# Patient Record
Sex: Female | Born: 1995
Health system: Southern US, Community
[De-identification: ages and names within clinical notes are randomized; demographics above are authoritative.]

## PROBLEM LIST (undated history)

## (undated) ENCOUNTER — Inpatient Hospital Stay (HOSPITAL_COMMUNITY): Payer: Self-pay

## (undated) VITALS — BP 112/80 | HR 105 | Temp 98.1°F | Resp 16 | Ht 59.0 in | Wt 107.8 lb

## (undated) DIAGNOSIS — N912 Amenorrhea, unspecified: Secondary | ICD-10-CM

## (undated) DIAGNOSIS — K625 Hemorrhage of anus and rectum: Secondary | ICD-10-CM

## (undated) DIAGNOSIS — R011 Cardiac murmur, unspecified: Secondary | ICD-10-CM

## (undated) DIAGNOSIS — F909 Attention-deficit hyperactivity disorder, unspecified type: Secondary | ICD-10-CM

## (undated) DIAGNOSIS — J45909 Unspecified asthma, uncomplicated: Secondary | ICD-10-CM

## (undated) HISTORY — PX: NO PAST SURGERIES: SHX2092

## (undated) HISTORY — PX: OTHER SURGICAL HISTORY: SHX169

## (undated) HISTORY — DX: Hemorrhage of anus and rectum: K62.5

---
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
## 2016-01-07 ENCOUNTER — Emergency Department (HOSPITAL_COMMUNITY)
Admission: EM | Admit: 2016-01-07 | Discharge: 2016-01-08 | Disposition: A | Discharge status: Home / Self Care | Payer: Medicaid Other | Attending: Emergency Medicine | Admitting: Emergency Medicine

## 2016-01-07 ENCOUNTER — Encounter (HOSPITAL_COMMUNITY): Payer: Self-pay | Admitting: Emergency Medicine

## 2016-01-07 DIAGNOSIS — Z8719 Personal history of other diseases of the digestive system: Secondary | ICD-10-CM | POA: Insufficient documentation

## 2016-01-07 DIAGNOSIS — F909 Attention-deficit hyperactivity disorder, unspecified type: Secondary | ICD-10-CM | POA: Insufficient documentation

## 2016-01-07 DIAGNOSIS — Z3202 Encounter for pregnancy test, result negative: Secondary | ICD-10-CM | POA: Insufficient documentation

## 2016-01-07 DIAGNOSIS — F3010 Manic episode without psychotic symptoms, unspecified: Secondary | ICD-10-CM

## 2016-01-07 DIAGNOSIS — F1094 Alcohol use, unspecified with alcohol-induced mood disorder: Secondary | ICD-10-CM

## 2016-01-07 DIAGNOSIS — F1490 Cocaine use, unspecified, uncomplicated: Secondary | ICD-10-CM

## 2016-01-07 DIAGNOSIS — J45909 Unspecified asthma, uncomplicated: Secondary | ICD-10-CM | POA: Insufficient documentation

## 2016-01-07 DIAGNOSIS — R000 Tachycardia, unspecified: Secondary | ICD-10-CM | POA: Insufficient documentation

## 2016-01-07 DIAGNOSIS — Z79899 Other long term (current) drug therapy: Secondary | ICD-10-CM | POA: Insufficient documentation

## 2016-01-07 DIAGNOSIS — IMO0002 Reserved for concepts with insufficient information to code with codable children: Secondary | ICD-10-CM

## 2016-01-07 DIAGNOSIS — F1210 Cannabis abuse, uncomplicated: Secondary | ICD-10-CM | POA: Insufficient documentation

## 2016-01-07 DIAGNOSIS — F309 Manic episode, unspecified: Secondary | ICD-10-CM | POA: Insufficient documentation

## 2016-01-07 DIAGNOSIS — F419 Anxiety disorder, unspecified: Secondary | ICD-10-CM | POA: Insufficient documentation

## 2016-01-07 DIAGNOSIS — Z8742 Personal history of other diseases of the female genital tract: Secondary | ICD-10-CM | POA: Insufficient documentation

## 2016-01-07 DIAGNOSIS — R011 Cardiac murmur, unspecified: Secondary | ICD-10-CM | POA: Insufficient documentation

## 2016-01-07 DIAGNOSIS — R451 Restlessness and agitation: Secondary | ICD-10-CM | POA: Insufficient documentation

## 2016-01-07 DIAGNOSIS — F1910 Other psychoactive substance abuse, uncomplicated: Secondary | ICD-10-CM

## 2016-01-07 DIAGNOSIS — F10920 Alcohol use, unspecified with intoxication, uncomplicated: Secondary | ICD-10-CM

## 2016-01-07 DIAGNOSIS — F10120 Alcohol abuse with intoxication, uncomplicated: Secondary | ICD-10-CM | POA: Insufficient documentation

## 2016-01-07 LAB — SALICYLATE LEVEL

## 2016-01-07 LAB — RAPID URINE DRUG SCREEN, HOSP PERFORMED
Amphetamines: NOT DETECTED
BARBITURATES: NOT DETECTED
Benzodiazepines: NOT DETECTED
Cocaine: POSITIVE — AB
Opiates: NOT DETECTED
Tetrahydrocannabinol: POSITIVE — AB

## 2016-01-07 LAB — CBC WITH DIFFERENTIAL/PLATELET
BASOS PCT: 1 %
Basophils Absolute: 0.1 10*3/uL (ref 0.0–0.1)
EOS ABS: 0 10*3/uL (ref 0.0–0.7)
EOS PCT: 0 %
HEMATOCRIT: 38.4 % (ref 36.0–46.0)
HEMOGLOBIN: 12.7 g/dL (ref 12.0–15.0)
LYMPHS PCT: 33 %
Lymphs Abs: 1.9 10*3/uL (ref 0.7–4.0)
MCH: 28 pg (ref 26.0–34.0)
MCHC: 33.1 g/dL (ref 30.0–36.0)
MCV: 84.6 fL (ref 78.0–100.0)
Monocytes Absolute: 0.5 10*3/uL (ref 0.1–1.0)
Monocytes Relative: 9 %
NEUTROS ABS: 3.3 10*3/uL (ref 1.7–7.7)
NEUTROS PCT: 57 %
Platelets: 297 10*3/uL (ref 150–400)
RBC: 4.54 MIL/uL (ref 3.87–5.11)
RDW: 14.1 % (ref 11.5–15.5)
WBC: 5.8 10*3/uL (ref 4.0–10.5)

## 2016-01-07 LAB — COMPREHENSIVE METABOLIC PANEL
ALBUMIN: 4.1 g/dL (ref 3.5–5.0)
ALK PHOS: 70 U/L (ref 38–126)
ALT: 18 U/L (ref 14–54)
AST: 29 U/L (ref 15–41)
Anion gap: 10 (ref 5–15)
BILIRUBIN TOTAL: 0.3 mg/dL (ref 0.3–1.2)
CO2: 24 mmol/L (ref 22–32)
Calcium: 9.1 mg/dL (ref 8.9–10.3)
Chloride: 110 mmol/L (ref 101–111)
Creatinine, Ser: 0.79 mg/dL (ref 0.44–1.00)
GFR calc Af Amer: >60 mL/min (ref >60)
GFR calc non Af Amer: >60 mL/min (ref >60)
GLUCOSE: 120 mg/dL — AB (ref 65–99)
POTASSIUM: 3.6 mmol/L (ref 3.5–5.1)
SODIUM: 144 mmol/L (ref 135–145)
TOTAL PROTEIN: 7.3 g/dL (ref 6.5–8.1)

## 2016-01-07 LAB — URINALYSIS, ROUTINE W REFLEX MICROSCOPIC
BILIRUBIN URINE: NEGATIVE
Glucose, UA: NEGATIVE mg/dL
HGB URINE DIPSTICK: NEGATIVE
KETONES UR: NEGATIVE mg/dL
Leukocytes, UA: NEGATIVE
NITRITE: NEGATIVE
Protein, ur: NEGATIVE mg/dL
Specific Gravity, Urine: 1.003 — ABNORMAL LOW (ref 1.005–1.030)
pH: 6.5 (ref 5.0–8.0)

## 2016-01-07 LAB — POC URINE PREG, ED: PREG TEST UR: NEGATIVE

## 2016-01-07 LAB — ACETAMINOPHEN LEVEL

## 2016-01-07 LAB — ETHANOL: Alcohol, Ethyl (B): 200 mg/dL — ABNORMAL HIGH (ref <5)

## 2016-01-07 MED ORDER — SODIUM CHLORIDE 0.9 % IV BOLUS (SEPSIS): 1000.0000 mL | Freq: Once | INTRAVENOUS | Status: DC

## 2016-01-07 MED ORDER — ALUM & MAG HYDROXIDE-SIMETH 200-200-20 MG/5ML PO SUSP: 30.0000 mL | ORAL | Status: DC | PRN

## 2016-01-07 MED ORDER — CITALOPRAM HYDROBROMIDE 10 MG PO TABS: 20.0000 mg | ORAL_TABLET | Freq: Every day | ORAL | Status: DC

## 2016-01-07 MED ORDER — LORAZEPAM 1 MG PO TABS: 1.0000 mg | ORAL_TABLET | Freq: Three times a day (TID) | ORAL | Status: DC | PRN

## 2016-01-07 MED ORDER — ALBUTEROL SULFATE HFA 108 (90 BASE) MCG/ACT IN AERS: 2.0000 | INHALATION_SPRAY | Freq: Four times a day (QID) | RESPIRATORY_TRACT | Status: DC | PRN

## 2016-01-07 MED ORDER — ACETAMINOPHEN 325 MG PO TABS: 650.0000 mg | ORAL_TABLET | ORAL | Status: DC | PRN

## 2016-01-07 MED ORDER — LORATADINE 10 MG PO TABS: 10.0000 mg | ORAL_TABLET | Freq: Every day | ORAL | Status: DC

## 2016-01-07 MED ORDER — ONDANSETRON HCL 4 MG PO TABS
4.0000 mg | ORAL_TABLET | Freq: Three times a day (TID) | ORAL | Status: DC | PRN
Start: 2016-01-07 — End: 2016-01-08
  Administered 2016-01-07: 4 mg via ORAL
  Filled 2016-01-07: qty 1

## 2016-01-07 MED ORDER — ZOLPIDEM TARTRATE 5 MG PO TABS: 5.0000 mg | ORAL_TABLET | Freq: Every evening | ORAL | Status: DC | PRN

## 2016-01-07 NOTE — ED Notes (Addendum)
Pt arrives with family members stating that she hasn't been acting herself since 0100 this morning, aunt states she's acting out. Inappropriate behavior with RN. Pt states, "everyone's worried about whether or not I'm alive, I just wanna record a track at the studio." Pt alert x4, but laughing inappropriately, attention span seems shortened. Aunt brought her in suspecting that she took some kind of drug. Pt denies.

## 2016-01-07 NOTE — ED Notes (Signed)
Pt ambulated to restroom w/ sitter.

## 2016-01-07 NOTE — ED Notes (Signed)
Lunch ordered for pt

## 2016-01-07 NOTE — ED Notes (Signed)
States does not take any meds. Claritin and Celexa d/c'd.

## 2016-01-07 NOTE — ED Notes (Signed)
PA at bedside.

## 2016-01-07 NOTE — ED Notes (Signed)
TTS in process; sitter at bedside; no signs of distress or needs.

## 2016-01-07 NOTE — ED Notes (Signed)
Sitter at bedside.

## 2016-01-07 NOTE — ED Notes (Signed)
Pt's father visiting. Answered his and pt's questions. Pt states she feels fine at this time and is wanting to leave. Advised pt unable to do so. Father also advised pt that she needs to go w/tx plan and be cooperative. Pt voiced understanding. Ad[facility] and father [facility] to stay overnight and will re-assess in am. Both in agreement w/plan.

## 2016-01-07 NOTE — BH Assessment (Addendum)
Tele Assessment Note   Marie Cabrera is an 20 y.o. female who was brought in by her adoptive mother who is her aunt (per ED PA) due to behavioral concerns related to drug use. Pt states, "I just used a little bit too much last night and I just wanted to go in to the studio and record and they got worried about me". 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 She states that she does not receive any OP services despite having an admission for an overdose in 2013. "I am always depressed, I always have SI off and on and think of plans, but "I know better" and "I know how to deal with it myself".  Pt reports taking no medication. Pt denies specific depressive symptoms, and says she is eating and sleeping normally, and that she "surrounds herself with people," but denies having true support.  PT denies homicidal ideation or history of violence. Pt denies auditory or visual hallucinations or other psychotic symptoms. Pt admits to alcohol abuse, "I just turned 20 and I have been drinking a lot (denies daily drinking, but admits to binge drinking when she does drink, "I don't have a problem, I don't even like drinking". Pt admits to daily marijuana use and occasional cocaine use.    Pt has limited insight and poor judgement at this time, but is under the influence of substances at the time of assessment per BAL and UDS (200 ETOH and positive for marijuana and cocaine).   Pt is casually dressed, alert, oriented x3 (not day of the week) with pressured speech and restless motor behavior Eye contact is poor  Pt's mood is irritable and affect is labile with occasional inappropriate laughter which is incongruent with thought content. Affect is congruent with mood. Thought process is racing.  It is unclear if pt  is currently responding to internal stimuli or experiencing delusional thought content. Pt was cooperative throughout  assessment.   Collateral information: Spoke with pt's aunt who brought her in to the ED due to behavior from 1-5 am after going "out" with her brother and cousin. being "wired, spinning around, could not get her to stop even after getting her in the shower". She is very concerned about the pt and wants to get her some help. 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 She says that pt has not been herself lately and that she knows she is depressed. She states that pt's adoptive mom has been in and out of the hospital since last September and is currently admitted at [facility] but will be transferred to [facility] to see a specialist. At one point this past year, pt's mom had to be resusitated due to severe GI problems, and [facility] is trying to fly in an MD from another state to rebuild her lower intestine. She states that pt is "great with the kids" (she cares for 2 younger kids in the home and for mom with her health problems), and she "has been under so much stress lately and needs help".  Pt's bio mom was a cousin of adopted mom, and died of a cocaine OD laced with rat poison. She states that pt was old enough to see her bio mom on drugs "running around naked".  Disposition: per Shuvon Rankin, NP--Seek Ip treatment.  Diagnosis: Major Depressive Disorder, recurrent, severe, Substance  Abuse Disorder  Past Medical History:  Past Medical History  Diagnosis Date  . Rectal bleeding   . ADHD (attention deficit hyperactivity disorder)   . Asthma     last ospitalized >4 years ago.  NO recent problesm  . Heart murmur     [facility].  MD 2 d Echo [id number]  . Amenorrhea     last menses 2007    Past Surgical History  Procedure Laterality Date  . No surgical history    . Colonoscopy  03/14/2012    Procedure: COLONOSCOPY;  Surgeon: Jon Gills, MD;  Location: [facility] OR;  Service: Gastroenterology;  Laterality: N/A;    Family History:  Family History  Problem Relation Age  of Onset  . Adopted: Yes  . Crohn's disease Maternal Aunt   . Colon polyps Maternal Aunt   . Colon cancer Maternal Grandfather   . Alcohol abuse Mother   . Depression Mother   . Drug abuse Mother   . Mental illness Mother   . Alcohol abuse Father   . Drug abuse Father   . Mental illness Sister   . Alcohol abuse Maternal Grandmother   . Depression Maternal Grandmother   . Diabetes Maternal Grandmother   . Kidney disease Maternal Grandmother   . Other      Pt is adopted, only knows about maternal family    Social History:  reports that she has been passively smoking.  She has never used smokeless tobacco. She reports that she uses illicit drugs (Marijuana). She reports that she does not drink alcohol.  Additional Social History:  Alcohol / Drug Use Pain Medications: None Prescriptions: None Over the Counter: N/A History of alcohol / drug use?: Yes Longest period of sobriety (when/how long): unk Negative Consequences of Use: Personal relationships Withdrawal Symptoms:  (denies) Substance #1 Name of Substance 1: alcohol 1 - Age of First Use: unk 1 - Amount (size/oz): variable 1 - Frequency: variable 1 - Duration: "I just turned 20, so i have been drinking a lot" 1 - Last Use / Amount: last night "a lot" Substance #2 Name of Substance 2: cocaine 2 - Age of First Use: unk 2 - Amount (size/oz): "a lot' 2 - Frequency: variable 2 - Duration: unk 2 - Last Use / Amount: last night Substance #3 Name of Substance 3: marijuana 3 - Age of First Use: unk 3 - Amount (size/oz): unk 3 - Frequency: daily 3 - Duration: ongoing 3 - Last Use / Amount: last night  CIWA: CIWA-Ar BP: 142/97 mmHg Pulse Rate: 109 COWS:    PATIENT STRENGTHS: (choose at least two) Ability for insight Average or above average intelligence Capable of independent living Communication skills  Allergies: No Known Allergies  Home Medications:  (Not in a hospital admission)  OB/GYN Status:  No LMP  recorded.  General Assessment Data Location of Assessment: [facility] ED TTS Assessment: In system Is this a Tele or Face-to-Face Assessment?: Tele Assessment Is this an Initial Assessment or a Re-assessment for this encounter?: Initial Assessment Marital status: Single Is patient pregnant?: Unknown Pregnancy Status: Unknown Living Arrangements: Parent Can pt return to current living arrangement?: Yes Admission Status: Involuntary Is patient capable of signing voluntary admission?: Yes Referral Source: Self/Family/Friend Insurance type: Sp     Crisis Care Plan Living Arrangements: Parent Name of Psychiatrist:  (none) Name of Therapist: none  Education Status Is patient currently in school?: No  Risk to self with the past 6 months Suicidal Ideation: Yes-Currently Present  Has patient been a risk to self within the past 6 months prior to admission? : No Suicidal Intent: No Has patient had any suicidal intent within the past 6 months prior to admission? : No Is patient at risk for suicide?: Yes Suicidal Plan?: Yes-Currently Present Has patient had any suicidal plan within the past 6 months prior to admission? : Yes Specify Current Suicidal Plan:  (refused to elaborate) Access to Means:  (unk) What has been your use of drugs/alcohol within the last 12 months?:  (see Sa section) Previous Attempts/Gestures: Yes How many times?: 1 Other Self Harm Risks:  (SA) Triggers for Past Attempts: Unknown Intentional Self Injurious Behavior: None Family Suicide History: Unknown Recent stressful life event(s):  (denies) Persecutory voices/beliefs?: No Depression: Yes Depression Symptoms: Loss of interest in usual pleasures, Feeling angry/irritable Substance abuse history and/or treatment for substance abuse?: Yes Suicide prevention information given to non-admitted patients: Not applicable  Risk to Others within the past 6 months Homicidal Ideation: No Does patient have any lifetime risk of  violence toward others beyond the six months prior to admission? : Unknown Thoughts of Harm to Others: No Current Homicidal Intent: No Current Homicidal Plan: No Access to Homicidal Means: No History of harm to others?: No Assessment of Violence: None Noted Does patient have access to weapons?: No Criminal Charges Pending?: No Does patient have a court date: No Is patient on probation?: No  Psychosis Hallucinations: None noted Delusions: None noted  Mental Status Report Appearance/Hygiene: Disheveled Eye Contact: Poor Motor Activity: Agitation Speech: Rapid, Pressured Level of Consciousness: Drowsy Mood: Irritable, Labile Affect: Labile, Inconsistent with thought content Anxiety Level: Moderate Thought Processes: Coherent, Relevant Judgement: Impaired Orientation: Person, Place, Situation, Appropriate for developmental age Obsessive Compulsive Thoughts/Behaviors: None  Cognitive Functioning Concentration: Fair Memory: Recent Intact, Remote Intact IQ: Average Insight: Poor Impulse Control: Fair Appetite: Good Weight Loss: 0 Weight Gain: 0 Sleep: No Change Total Hours of Sleep: 8  ADLScreening [facility] Assessment Services) Patient's cognitive ability adequate to safely complete daily activities?: Yes Patient able to express need for assistance with ADLs?: Yes Independently performs ADLs?: Yes (appropriate for developmental age)  Prior Inpatient Therapy Prior Inpatient Therapy: Yes Prior Therapy Dates: 2013 [facility] Reason for Treatment: suicide attempt  Prior Outpatient Therapy Prior Outpatient Therapy: No Does patient have an ACCT team?: No Does patient have Intensive In-House Services?  : No Does patient have [facility] services? : No Does patient have P4CC services?: No  ADL Screening (condition at time of admission) Patient's cognitive ability adequate to safely complete daily activities?: Yes Is the patient deaf or have  difficulty hearing?: No Does the patient have difficulty seeing, even when wearing glasses/contacts?: No Does the patient have difficulty concentrating, remembering, or making decisions?: No Patient able to express need for assistance with ADLs?: Yes Does the patient have difficulty dressing or bathing?: No Independently performs ADLs?: Yes (appropriate for developmental age) Does the patient have difficulty walking or climbing stairs?: No Weakness of Legs: None Weakness of Arms/Hands: None  Home Assistive Devices/Equipment Home Assistive Devices/Equipment: None    Abuse/Neglect Assessment (Assessment to be complete while patient is alone) Physical Abuse: Denies, provider concerned (Comment) ("I don't know") Verbal Abuse: Denies, provider concerned (Comment) Sexual Abuse: Denies, provider concered (Comment) Exploitation of patient/patient's resources: Denies Self-Neglect: Denies Values / Beliefs Cultural Requests During Hospitalization: None Spiritual Requests During Hospitalization: None   Advance Directives (For Healthcare) Does patient have an advance directive?: No Would patient like information on creating  an advanced directive?: No - patient declined information    Additional Information 1:1 In Past 12 Months?: No CIRT Risk: No Elopement Risk: Yes Does patient have medical clearance?: Yes     Disposition:  Disposition Initial Assessment Completed for this Encounter: Yes Disposition of Patient: Other dispositions (observe overnight) Other disposition(s):  (observe overnight)  Marie Cabrera 01/07/2016 9:44 AM

## 2016-01-07 NOTE — Progress Notes (Signed)
[facility] per Dannielle Huhanny (while he notes it is unlikely there will be a bed this weekend)  [facility] at capacity at this time but considered for admission upon bed availability. No other referral options at this time. Per ED, pt requesting to be d/c'd home. Pt can be re-evaluated 01/08/16 am (if not placed by that time) to determine cont'd need for inpatient treatment.  Ilean SkillMeghan Weda Baumgarner, MSW, LCSW Clinical Social Work, Disposition  01/07/2016 [phone]

## 2016-01-07 NOTE — ED Notes (Signed)
Original IVC papers (Affidavit & 1st exam) placed in folder for Magistrate. [facility] and copy sent to medical records.

## 2016-01-07 NOTE — ED Notes (Signed)
Pt asking if she will be given med to help her sleep this evening. Advised pt Ambien may be given. Voiced understanding.

## 2016-01-07 NOTE — ED Notes (Signed)
Pt resting; sitter at bedside. 

## 2016-01-07 NOTE — ED Notes (Signed)
[facility] w/sitter. Pt noted to be dressed in personal clothing - pt initially refusing to change into paper scrubs. Stated she wants to leave - advised pt is IVC - voiced understanding and changed. Security wanded pt.

## 2016-01-07 NOTE — ED Notes (Signed)
IVC papers served by [employer]. 

## 2016-01-07 NOTE — ED Notes (Addendum)
[facility] [phone]; BH updated.

## 2016-01-07 NOTE — ED Provider Notes (Signed)
CSN: [id number]     Arrival date & time 01/07/16  0536 History   First MD Initiated Contact with Patient 01/07/16 0602     Chief Complaint  Patient presents with  . Behavior Problem     (Consider location/radiation/quality/duration/timing/severity/associated sxs/prior Treatment) HPI   Marie Cabrera is a 20 y.o. female with PMH significant for ADHD, depression, suicidal ideations, hx of self harm (cutting) who presents with behavioral problem.  Patient presents to the emergency department brought in by her aunt and her cousin. Aunt reports that the patient and some of the younger family members had been "out". She states that they had been drinking, but the patient was constantly in the bathroom. Patient does report that she did cocaine "recently". She states "but I know how to handle myself ". She states that she does occasionally smoke marijuana. She denies alcohol use. She reports that approximately 1 AM this morning she began acting very strangely, and reports her acting out. 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 And also states that her mother is sick and is in the hospital upstairs. She states that her biological mother died from cocaine overdose, and she is worried that the patient will do the same. Patient denies any complaints. She denies headache, visual disturbance, chest pain, shortness of breath, abdominal pain, or urinary symptoms. She denies suicidal ideation or homicidal ideation. She does state that she "I've been wanting to commit suicide for a long time, but that's not even really an issue ".  Per chart review, 05/21/2012 patient presented to the emergency department or suicide attempt by ingestion. She also has a history of self-harm.  Patient met inpatient criteria for treatment, and was admitted.    Past Medical History   Diagnosis Date  . Rectal bleeding   . ADHD (attention deficit hyperactivity disorder)   . Asthma     last ospitalized >4 years ago.  NO recent problesm  . Heart murmur     [facility].  MD 2 d Echo [address]012  . Amenorrhea     last menses 2007   Past Surgical History  Procedure Laterality Date  . No surgical history    . Colonoscopy  03/14/2012    Procedure: COLONOSCOPY;  Surgeon: Oletha Blend, MD;  Location: [facility];  Service: Gastroenterology;  Laterality: N/A;   Family History  Problem Relation Age of Onset  . Adopted: Yes  . Crohn's disease Maternal Aunt   . Colon polyps Maternal Aunt   . Colon cancer Maternal Grandfather   . Alcohol abuse Mother   . Depression Mother   . Drug abuse Mother   . Mental illness Mother   . Alcohol abuse Father   . Drug abuse Father   . Mental illness Sister   . Alcohol abuse Maternal Grandmother   . Depression Maternal Grandmother   . Diabetes Maternal Grandmother   . Kidney disease Maternal Grandmother   . Other      Pt is adopted, only knows about maternal family   Social History  Substance Use Topics  . Smoking status: Passive Smoke Exposure - Never Smoker  . Smokeless tobacco: Never Used  . Alcohol Use: No   OB History    No data available     Review of Systems  Psychiatric/Behavioral: Positive for  behavioral problems. Negative for suicidal ideas.  All other systems reviewed and are negative.     Allergies  Review of patient's allergies indicates no known allergies.  Home Medications   Prior to Admission medications   Medication Sig Start Date End Date Taking? Authorizing Provider  cetirizine (ZYRTEC) 10 MG tablet Take 10 mg by mouth daily. Seasonal allergies 01/14/12   Historical Provider, MD  citalopram (CELEXA) 20 MG tablet Take 1 tablet (20 mg total) by mouth daily after breakfast. 05/29/12   Aurelio Jew, NP  dextroamphetamine (DEXTROSTAT) 5 MG tablet Take 1 tablet (5 mg total) by mouth 2 (two) times daily with  breakfast and lunch. 05/29/12   Aurelio Jew, NP  PROAIR HFA 108 (90 BASE) MCG/ACT inhaler Inhale 2 puffs into the lungs every 6 (six) hours as needed. Seasonal wheezing or shortness of breath 01/15/12   Historical Provider, MD   BP 142/97 mmHg  Pulse 109  Temp(Src) 98.7 F (37.1 C) (Oral)  Resp 16  Ht 5' (1.524 m)  Wt 58.968 kg  BMI 25.39 kg/m2  SpO2 99%  LMP  Physical Exam  Constitutional: She is oriented to person, place, and time. She appears well-developed and well-nourished.  Non-toxic appearance. She does not have a sickly appearance. She does not appear ill.  HENT:  Head: Normocephalic and atraumatic.  Mouth/Throat: Oropharynx is clear and moist.  Eyes: Conjunctivae are normal. Pupils are equal, round, and reactive to light.  Neck: Normal range of motion. Neck supple.  Cardiovascular: Regular rhythm and normal heart sounds.  Tachycardia present.   No murmur heard. Pulmonary/Chest: Effort normal and breath sounds normal. No accessory muscle usage or stridor. No respiratory distress. She has no wheezes. She has no rhonchi. She has no rales.  Abdominal: Soft. Bowel sounds are normal. She exhibits no distension. There is no tenderness.  Musculoskeletal: Normal range of motion.  Lymphadenopathy:    She has no cervical adenopathy.  Neurological: She is alert and oriented to person, place, and time.  Speech is rapid, pressured, accelerated, and difficulty to interrupt.   Skin: Skin is warm and dry.  Psychiatric: Her mood appears anxious. Her affect is labile and inappropriate. Her speech is rapid and/or pressured. She is agitated and hyperactive. She expresses impulsivity. She expresses suicidal (says "i've been wanting to commit suicide for years") ideation. She expresses no homicidal ideation. She expresses no suicidal plans and no homicidal plans.    ED Course  Procedures (including critical care time) Labs Review Labs Reviewed  COMPREHENSIVE METABOLIC PANEL - Abnormal; Notable  for the following:    Glucose, Bld 120 (*)    BUN <5 (*)    All other components within normal limits  ETHANOL - Abnormal; Notable for the following:    Alcohol, Ethyl (B) 200 (*)    All other components within normal limits  URINE RAPID DRUG SCREEN, HOSP PERFORMED - Abnormal; Notable for the following:    Cocaine POSITIVE (*)    Tetrahydrocannabinol POSITIVE (*)    All other components within normal limits  ACETAMINOPHEN LEVEL - Abnormal; Notable for the following:    Acetaminophen (Tylenol), Serum <10 (*)    All other components within normal limits  URINALYSIS, ROUTINE W REFLEX MICROSCOPIC (NOT AT [facility]) - Abnormal; Notable for the following:    Specific Gravity, Urine 1.003 (*)    All other components within normal limits  CBC WITH DIFFERENTIAL/PLATELET  SALICYLATE LEVEL  POC URINE PREG, ED    Imaging Review No results  found. I have personally reviewed and evaluated these images and lab results as part of my medical decision-making.   EKG Interpretation   Date/Time:  Saturday January 07 2016 06:44:50 EDT Ventricular Rate:  93 PR Interval:  175 QRS Duration: 84 QT Interval:  358 QTC Calculation: 445 R Axis:   48 Text Interpretation:  Sinus rhythm ST elev, probable normal early repol  pattern No acute changes No significant change since last tracing  Confirmed by Kathrynn Humble, MD, ANKIT [phone]) on 01/07/2016 7:13:51 AM      MDM   Final diagnoses:  Cocaine use  Substance abuse  Manic behavior ([place])   Patient presents to ED brought in by family members with concern for abnormal behavior.  Patient reports using cocaine recently.  Family members concerned she is at risk for harming herself.  Hx of suicide attempt, self harm, and substance abuse (marijuana).  Mother died of cocaine overdose.  On arrival, vitals stable with mild tachycardia.  Patient has no complaints.  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  She exhibits manic behavior with rapid  pressured speech, labile mood, ideas of grandiosity.  This is likely cocaine induced.  Given her history, substance abuse, and manic behavior, I do not feel she is safe to be discharged, I am concerned about self harm.  Therefore, she is under IVC.  Labs obtained.  Ethanol level 200, UDS positive for cocaine and THC.  Otherwise, without acute abnormality.  TTS consulted for appropriate disposition and recommendations.  Case has been discussed with Dr. Kathrynn Humble who agrees with the above plan.    Gloriann Loan, PA-C 01/07/16 [place], MD 01/07/16 2306

## 2016-01-08 ENCOUNTER — Encounter (HOSPITAL_COMMUNITY): Payer: Self-pay | Admitting: Registered Nurse

## 2016-01-08 DIAGNOSIS — F1094 Alcohol use, unspecified with alcohol-induced mood disorder: Secondary | ICD-10-CM | POA: Diagnosis not present

## 2016-01-08 DIAGNOSIS — IMO0002 Reserved for concepts with insufficient information to code with codable children: Secondary | ICD-10-CM

## 2016-01-08 DIAGNOSIS — R69 Illness, unspecified: Secondary | ICD-10-CM | POA: Diagnosis not present

## 2016-01-08 DIAGNOSIS — F1910 Other psychoactive substance abuse, uncomplicated: Secondary | ICD-10-CM | POA: Insufficient documentation

## 2016-01-08 NOTE — ED Notes (Signed)
Pt signed "No Harm Contract" - copy given to pt. 

## 2016-01-08 NOTE — ED Notes (Signed)
[facility] - so may advise pt is being d/c'd to home - no longer needed placement.

## 2016-01-08 NOTE — ED Provider Notes (Signed)
Patient has been cleared by psychiatry for discharge. She is not suicidal or homicidal and contracts for safety. IVC rescinded. She is awake and alert, oriented 3. Denies any pain. She is pleasant and cooperative.  BP 132/82 mmHg  Pulse 61  Temp(Src) 98.6 F (37 C) (Oral)  Resp 18  Ht 5' (1.524 m)  Wt 130 lb (58.968 kg)  BMI 25.39 kg/m2  SpO2 100%  LMP    Glynn OctaveStephen Hyman Crossan, MD 01/08/16 1447

## 2016-01-08 NOTE — ED Notes (Signed)
Dr Rancour in w/pt. 

## 2016-01-08 NOTE — ED Notes (Signed)
Sitting in bed talking and laughing w/sitter while waiting for aunt.

## 2016-01-08 NOTE — Discharge Instructions (Signed)
Polysubstance Abuse °When people abuse more than one drug or type of drug it is called polysubstance or polydrug abuse. For example, many smokers also drink alcohol. This is one form of polydrug abuse. Polydrug abuse also refers to the use of a drug to counteract an unpleasant effect produced by another drug. It may also be used to help with withdrawal from another drug. People who take stimulants may become agitated. Sometimes this agitation is countered with a tranquilizer. This helps protect against the unpleasant side effects. Polydrug abuse also refers to the use of different drugs at the same time.  °Anytime drug use is interfering with normal living activities, it has become abuse. This includes problems with family and friends. Psychological dependence has developed when your mind tells you that the drug is needed. This is usually followed by physical dependence which has developed when continuing increases of drug are required to get the same feeling or "high". This is known as addiction or chemical dependency. A person's risk is much higher if there is a history of chemical dependency in the family. °SIGNS OF CHEMICAL DEPENDENCY °· You have been told by friends or family that drugs have become a problem. °· You fight when using drugs. °· You are having blackouts (not remembering what you do while using). °· You feel sick from using drugs but continue using. °· You lie about use or amounts of drugs (chemicals) used. °· You need chemicals to get you going. °· You are suffering in work performance or in school because of drug use. °· You get sick from use of drugs but continue to use anyway. °· You need drugs to relate to people or feel comfortable in social situations. °· You use drugs to forget problems. °"Yes" answered to any of the above signs of chemical dependency indicates there are problems. The longer the use of drugs continues, the greater the problems will become. °If there is a family history of  drug or alcohol use, it is best not to experiment with these drugs. Continual use leads to tolerance. After tolerance develops more of the drug is needed to get the same feeling. This is followed by addiction. With addiction, drugs become the most important part of life. It becomes more important to take drugs than participate in the other usual activities of life. This includes relating to friends and family. Addiction is followed by dependency. Dependency is a condition where drugs are now needed not just to get high, but to feel normal. °Addiction cannot be cured but it can be stopped. This often requires outside help and the care of professionals. Treatment centers are listed in the yellow pages under: Cocaine, Narcotics, and Alcoholics Anonymous. Most hospitals and clinics can refer you to a specialized care center. Talk to your caregiver if you need help. °  °This information is not intended to replace advice given to you by your health care provider. Make sure you discuss any questions you have with your health care provider. °  °Document Released: 04/18/2005 Document Revised: 11/19/2011 Document Reviewed: 09/01/2014 °Elsevier Interactive Patient Education ©2016 [employer] ° °

## 2016-01-08 NOTE — ED Notes (Addendum)
Per Jonette EvaBarbara, [facility] - willing to accept pt - [facility] after re-assessment. Requesting to be advised.

## 2016-01-08 NOTE — ED Notes (Signed)
Pt to shower [phone] w/sitter.

## 2016-01-08 NOTE — ED Notes (Signed)
[facility] - aware pt needs to be re-assessed this am.

## 2016-01-08 NOTE — ED Notes (Signed)
IVC rescinded by Dr Manus Gunningancour.

## 2016-01-08 NOTE — ED Notes (Addendum)
Pt changed into personal clothing. Pt requested to stay in room until her aunt arrives to pick her up.

## 2016-01-08 NOTE — ED Notes (Signed)
[facility] aware of [facility]. Pt called someone for ride from ED.

## 2016-01-08 NOTE — ED Notes (Signed)
Rescind paperwork faxed to [employer] of [employer], copy sent to medical records and original placed in folder for magistrate.

## 2016-01-08 NOTE — ED Notes (Signed)
Ordered pt a breakfast tray.

## 2016-01-08 NOTE — Consult Note (Signed)
Telepsych Consultation   Reason for Consult:  Intoxication; bizarre behavior Referring Physician:  EDP Patient Identification: Marie Cabrera MRN:  [id number] Principal Diagnosis: Intoxication Diagnosis:   Patient Active Problem List   Diagnosis Date Noted  . Alcohol-induced mood disorder ([place]) [F10.94] 01/08/2016  . Intoxication [R69] 01/08/2016  . Oppositional defiant disorder [F91.3] 05/22/2012  . Parent-child relational problem [Z62.820] 05/22/2012  . Family history of inflammatory bowel disease [Z83.79] 03/06/2012  . Family history of colonic polyps [Z83.71] 03/06/2012  . Hematochezia [K92.1]     Total Time spent with patient: 30 minutes  Subjective:   Marie Cabrera is a 20 y.o. female patient admitted with [facility] after she was brought in by her adoptive mother for bizarre behavior and drug use (hallucinations) .  HPI:  Patient seen by this provider, case reviewed with Dr. Doyne Keel.  On evaluation:  Marie Cabrera states that she turned 20 yrs old yesterday and she had to much to drink getting intoxicated.  States that she can't remember what she said or did yesterday.  Patient denies a past psych history (No inpatient, outpatient, or psychotropics)  Patient does admit that she has suicidal thoughts/suicide attempt several years ago but did not go inpatient.  Patient states that she is living with her mother and has been in [place] for 5 months.  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  At this time patient denies suicidal/homicidal ideation, psychosis, and paranoia.  States that she has learned her lesson with alcohol.  Patient is alert, oriented x4, calm, and cooperative.   Past Psychiatric History: Prior history of suicidal thought/attempt several years ago.  No inpt/outpt or psychotropics  Risk to Self: Suicidal Ideation: Yes-Currently Present Suicidal Intent: No Is patient at risk for suicide?: Yes Suicidal Plan?: Yes-Currently Present Specify Current Suicidal Plan:   (refused to elaborate) Access to Means:  (unk) What has been your use of drugs/alcohol within the last 12 months?:  (see Sa section) How many times?: 1 Other Self Harm Risks:  (SA) Triggers for Past Attempts: Unknown Intentional Self Injurious Behavior: None Risk to Others: Homicidal Ideation: No Thoughts of Harm to Others: No Current Homicidal Intent: No Current Homicidal Plan: No Access to Homicidal Means: No History of harm to others?: No Assessment of Violence: None Noted Does patient have access to weapons?: No Criminal Charges Pending?: No Does patient have a court date: No Prior Inpatient Therapy: Prior Inpatient Therapy: Yes Prior Therapy Dates: 2013 Prior Therapy Facilty/Provider(s): [facility] Reason for Treatment: suicide attempt Prior Outpatient Therapy: Prior Outpatient Therapy: No Does patient have an ACCT team?: No Does patient have Intensive In-House Services?  : No Does patient have [facility] services? : No Does patient have P4CC services?: No  Past Medical History:  Past Medical History  Diagnosis Date  . Rectal bleeding   . ADHD (attention deficit hyperactivity disorder)   . Asthma     last ospitalized >4 years ago.  NO recent problesm  . Heart murmur     [facility].  MD 2 d Echo 23012  . Amenorrhea     last menses 2007    Past Surgical History  Procedure Laterality Date  . No surgical history    . Colonoscopy  03/14/2012    Procedure: COLONOSCOPY;  Surgeon: Oletha Blend, MD;  Location: [place];  Service: Gastroenterology;  Laterality: N/A;   Family History:  Family History  Problem Relation Age of Onset  . Adopted: Yes  . Crohn's disease Maternal  Aunt   . Colon polyps Maternal Aunt   . Colon cancer Maternal Grandfather   . Alcohol abuse Mother   . Depression Mother   . Drug abuse Mother   . Mental illness Mother   . Alcohol abuse Father   . Drug abuse Father   . Mental illness Sister   . Alcohol abuse Maternal Grandmother   . Depression  Maternal Grandmother   . Diabetes Maternal Grandmother   . Kidney disease Maternal Grandmother   . Other      Pt is adopted, only knows about maternal family   Family Psychiatric  History: Denies family history of mental illness Social History:  History  Alcohol Use No     History  Drug Use  . Yes  . Special: Marijuana    Comment: every two months    Social History   Social History  . Marital Status: Single    Spouse Name: N/A  . Number of Children: N/A  . Years of Education: N/A   Occupational History  . student     10th grade at [employer]  . Smoking status: Passive Smoke Exposure - Never Smoker  . Smokeless tobacco: Never Used  . Alcohol Use: No  . Drug Use: Yes    Special: Marijuana     Comment: every two months  . Sexual Activity:    Partners: Male    Birth Control/ Protection: Condom     Comment: per patient   Other Topics Concern  . None   Social History Narrative   Additional Social History:    Allergies:  No Known Allergies  Labs:  Results for orders placed or performed during the hospital encounter of 01/07/16 (from the past 48 hour(s))  Urine rapid drug screen (hosp performed)not at [facility]     Status: Abnormal   Collection Time: 01/07/16  6:30 AM  Result Value Ref Range   Opiates NONE DETECTED NONE DETECTED   Cocaine POSITIVE (A) NONE DETECTED   Benzodiazepines NONE DETECTED NONE DETECTED   Amphetamines NONE DETECTED NONE DETECTED   Tetrahydrocannabinol POSITIVE (A) NONE DETECTED   Barbiturates NONE DETECTED NONE DETECTED    Comment:        DRUG SCREEN FOR MEDICAL PURPOSES ONLY.  IF CONFIRMATION IS NEEDED FOR ANY PURPOSE, NOTIFY LAB WITHIN 5 DAYS.        LOWEST DETECTABLE LIMITS FOR URINE DRUG SCREEN Drug Class       Cutoff (ng/mL) Amphetamine      1000 Barbiturate      200 Benzodiazepine   628 Tricyclics       366 Opiates          300 Cocaine          300 THC              50   Urinalysis, Routine w  reflex microscopic (not at [facility])     Status: Abnormal   Collection Time: 01/07/16  6:30 AM  Result Value Ref Range   Color, Urine YELLOW YELLOW   APPearance CLEAR CLEAR   Specific Gravity, Urine 1.003 (L) 1.005 - 1.030   pH 6.5 5.0 - 8.0   Glucose, UA NEGATIVE NEGATIVE mg/dL   Hgb urine dipstick NEGATIVE NEGATIVE   Bilirubin Urine NEGATIVE NEGATIVE   Ketones, ur NEGATIVE NEGATIVE mg/dL   Protein, ur NEGATIVE NEGATIVE mg/dL   Nitrite NEGATIVE NEGATIVE   Leukocytes, UA NEGATIVE NEGATIVE    Comment: MICROSCOPIC NOT  DONE ON URINES WITH NEGATIVE PROTEIN, BLOOD, LEUKOCYTES, NITRITE, OR GLUCOSE <1000 mg/dL.  Comprehensive metabolic panel     Status: Abnormal   Collection Time: 01/07/16  6:46 AM  Result Value Ref Range   Sodium 144 135 - 145 mmol/L   Potassium 3.6 3.5 - 5.1 mmol/L   Chloride 110 101 - 111 mmol/L   CO2 24 22 - 32 mmol/L   Glucose, Bld 120 (H) 65 - 99 mg/dL   BUN <5 (L) 6 - 20 mg/dL   Creatinine, Ser 0.79 0.44 - 1.00 mg/dL   Calcium 9.1 8.9 - 10.3 mg/dL   Total Protein 7.3 6.5 - 8.1 g/dL   Albumin 4.1 3.5 - 5.0 g/dL   AST 29 15 - 41 U/L   ALT 18 14 - 54 U/L   Alkaline Phosphatase 70 38 - 126 U/L   Total Bilirubin 0.3 0.3 - 1.2 mg/dL   GFR calc non Af Amer >60 >60 mL/min   GFR calc Af Amer >60 >60 mL/min    Comment: (NOTE) The eGFR has been calculated using the CKD EPI equation. This calculation has not been validated in all clinical situations. eGFR's persistently <60 mL/min signify possible Chronic Kidney Disease.    Anion gap 10 5 - 15  CBC with Diff     Status: None   Collection Time: 01/07/16  6:46 AM  Result Value Ref Range   WBC 5.8 4.0 - 10.5 K/uL   RBC 4.54 3.87 - 5.11 MIL/uL   Hemoglobin 12.7 12.0 - 15.0 g/dL   HCT 38.4 36.0 - 46.0 %   MCV 84.6 78.0 - 100.0 fL   MCH 28.0 26.0 - 34.0 pg   MCHC 33.1 30.0 - 36.0 g/dL   RDW 14.1 11.5 - 15.5 %   Platelets 297 150 - 400 K/uL   Neutrophils Relative % 57 %   Lymphocytes Relative 33 %   Monocytes Relative  9 %   Eosinophils Relative 0 %   Basophils Relative 1 %   Neutro Abs 3.3 1.7 - 7.7 K/uL   Lymphs Abs 1.9 0.7 - 4.0 K/uL   Monocytes Absolute 0.5 0.1 - 1.0 K/uL   Eosinophils Absolute 0.0 0.0 - 0.7 K/uL   Basophils Absolute 0.1 0.0 - 0.1 K/uL  Ethanol     Status: Abnormal   Collection Time: 01/07/16  6:47 AM  Result Value Ref Range   Alcohol, Ethyl (B) 200 (H) <5 mg/dL    Comment:        LOWEST DETECTABLE LIMIT FOR SERUM ALCOHOL IS 5 mg/dL FOR MEDICAL PURPOSES ONLY   Salicylate level     Status: None   Collection Time: 01/07/16  6:47 AM  Result Value Ref Range   Salicylate Lvl <4.6 2.8 - 30.0 mg/dL  Acetaminophen level     Status: Abnormal   Collection Time: 01/07/16  6:47 AM  Result Value Ref Range   Acetaminophen (Tylenol), Serum <10 (L) 10 - 30 ug/mL    Comment:        THERAPEUTIC CONCENTRATIONS VARY SIGNIFICANTLY. A RANGE OF 10-30 ug/mL MAY BE AN EFFECTIVE CONCENTRATION FOR MANY PATIENTS. HOWEVER, SOME ARE BEST TREATED AT CONCENTRATIONS OUTSIDE THIS RANGE. ACETAMINOPHEN CONCENTRATIONS >150 ug/mL AT 4 HOURS AFTER INGESTION AND >50 ug/mL AT 12 HOURS AFTER INGESTION ARE OFTEN ASSOCIATED WITH TOXIC REACTIONS.   POC Urine Pregnancy, ED  (not at [facility])     Status: None   Collection Time: 01/07/16  6:55 AM  Result Value Ref Range  Preg Test, Ur NEGATIVE NEGATIVE    Comment:        THE SENSITIVITY OF THIS METHODOLOGY IS >24 mIU/mL     Current Facility-Administered Medications  Medication Dose Route Frequency Provider Last Rate Last Dose  . acetaminophen (TYLENOL) tablet 650 mg  650 mg Oral Q4H PRN Dorie Rank, MD      . albuterol (PROVENTIL HFA;VENTOLIN HFA) 108 (90 Base) MCG/ACT inhaler 2 puff  2 puff Inhalation Q6H PRN Gloriann Loan, PA-C      . alum & mag hydroxide-simeth (MAALOX/MYLANTA) 200-200-20 MG/5ML suspension 30 mL  30 mL Oral PRN Dorie Rank, MD      . LORazepam (ATIVAN) tablet 1 mg  1 mg Oral Q8H PRN Dorie Rank, MD      . ondansetron [facility]) tablet 4 mg  4 mg  Oral Q8H PRN Dorie Rank, MD   4 mg at 01/07/16 1444  . zolpidem (AMBIEN) tablet 5 mg  5 mg Oral QHS PRN Leo Grosser, MD       Current Outpatient Prescriptions  Medication Sig Dispense Refill  . albuterol (PROVENTIL HFA;VENTOLIN HFA) 108 (90 Base) MCG/ACT inhaler Inhale 1 puff into the lungs every 6 (six) hours as needed for wheezing or shortness of breath (seasonal allergies).    . citalopram (CELEXA) 20 MG tablet Take 1 tablet (20 mg total) by mouth daily after breakfast. (Patient not taking: Reported on 01/07/2016) 30 tablet 1  . dextroamphetamine (DEXTROSTAT) 5 MG tablet Take 1 tablet (5 mg total) by mouth 2 (two) times daily with breakfast and lunch. (Patient not taking: Reported on 01/07/2016) 60 tablet 0    Musculoskeletal: Strength & Muscle Tone: within normal limits Gait & Station: normal Patient leans: N/A      Psychiatric Specialty Exam: Review of Systems  Psychiatric/Behavioral: Negative for depression, suicidal ideas and hallucinations. The patient is not nervous/anxious and does not have insomnia.   All other systems reviewed and are negative.   Blood pressure 132/82, pulse 61, temperature 98.6 F (37 C), temperature source Oral, resp. rate 18, height 5' (1.524 m), weight 58.968 kg (130 lb), SpO2 100 %.Body mass index is 25.39 kg/(m^2).  General Appearance: Casual  Eye Contact::  Good  Speech:  Clear and Coherent and Normal Rate  Volume:  Normal  Mood:  Appropriate  Affect:  Appropriate  Thought Process:  Circumstantial, Goal Directed and Logical  Orientation:  Full (Time, Place, and Person)  Thought Content:  WDL  Suicidal Thoughts:  No  Homicidal Thoughts:  No  Memory:  Immediate;   Good Recent;   Good Remote;   Good  Judgement:  Intact  Insight:  Present  Psychomotor Activity:  Normal  Concentration:  Good  Recall:  Good  Fund of Knowledge:Good  Language: Good  Akathisia:  No  Handed:  Right  AIMS (if indicated):     Assets:  Communication Skills Desire  for Improvement Housing Physical Health Social Support  ADL's:  Intact  Cognition: WNL  Sleep:      Treatment Plan Summary: Plan Discharge home  Disposition: No evidence of imminent risk to self or others at present.   Patient does not meet criteria for psychiatric inpatient admission. Discussed crisis plan, support from social network, calling 911, coming to the Emergency Department, and calling Suicide Hotline.   Rankin, Shuvon, NP 01/08/2016 2:27 PM

## 2016-01-08 NOTE — ED Notes (Signed)
Erskine SpeedBarbara, Rowan, aware pt being d/c'd.

## 2016-07-13 ENCOUNTER — Emergency Department (HOSPITAL_COMMUNITY): Payer: Self-pay

## 2016-07-13 ENCOUNTER — Emergency Department (HOSPITAL_COMMUNITY)
Admission: EM | Admit: 2016-07-13 | Discharge: 2016-07-13 | Disposition: A | Discharge status: Home / Self Care | Payer: Self-pay | Attending: Emergency Medicine | Admitting: Emergency Medicine

## 2016-07-13 ENCOUNTER — Encounter (HOSPITAL_COMMUNITY): Payer: Self-pay

## 2016-07-13 DIAGNOSIS — Z7722 Contact with and (suspected) exposure to environmental tobacco smoke (acute) (chronic): Secondary | ICD-10-CM | POA: Insufficient documentation

## 2016-07-13 DIAGNOSIS — J45909 Unspecified asthma, uncomplicated: Secondary | ICD-10-CM | POA: Insufficient documentation

## 2016-07-13 DIAGNOSIS — F909 Attention-deficit hyperactivity disorder, unspecified type: Secondary | ICD-10-CM | POA: Insufficient documentation

## 2016-07-13 DIAGNOSIS — N3090 Cystitis, unspecified without hematuria: Secondary | ICD-10-CM | POA: Insufficient documentation

## 2016-07-13 DIAGNOSIS — R1010 Upper abdominal pain, unspecified: Secondary | ICD-10-CM | POA: Insufficient documentation

## 2016-07-13 LAB — CBC WITH DIFFERENTIAL/PLATELET
Basophils Absolute: 0 10*3/uL (ref 0.0–0.1)
Basophils Relative: 0 %
Eosinophils Absolute: 0.1 10*3/uL (ref 0.0–0.7)
Eosinophils Relative: 2 %
HCT: 37.2 % (ref 36.0–46.0)
Hemoglobin: 12.4 g/dL (ref 12.0–15.0)
Lymphocytes Relative: 47 %
Lymphs Abs: 2.3 10*3/uL (ref 0.7–4.0)
MCH: 27.4 pg (ref 26.0–34.0)
MCHC: 33.3 g/dL (ref 30.0–36.0)
MCV: 82.3 fL (ref 78.0–100.0)
Monocytes Absolute: 0.4 10*3/uL (ref 0.1–1.0)
Monocytes Relative: 8 %
Neutro Abs: 2.1 10*3/uL (ref 1.7–7.7)
Neutrophils Relative %: 43 %
Platelets: 238 10*3/uL (ref 150–400)
RBC: 4.52 MIL/uL (ref 3.87–5.11)
RDW: 14.5 % (ref 11.5–15.5)
WBC: 4.9 10*3/uL (ref 4.0–10.5)

## 2016-07-13 LAB — COMPREHENSIVE METABOLIC PANEL
ALT: 15 U/L (ref 14–54)
AST: 19 U/L (ref 15–41)
Albumin: 4.6 g/dL (ref 3.5–5.0)
Alkaline Phosphatase: 61 U/L (ref 38–126)
Anion gap: 6 (ref 5–15)
BUN: 15 mg/dL (ref 6–20)
CO2: 24 mmol/L (ref 22–32)
Calcium: 9.2 mg/dL (ref 8.9–10.3)
Chloride: 108 mmol/L (ref 101–111)
Creatinine, Ser: 0.69 mg/dL (ref 0.44–1.00)
GFR calc Af Amer: >60 mL/min (ref >60)
GFR calc non Af Amer: >60 mL/min (ref >60)
Glucose, Bld: 75 mg/dL (ref 65–99)
Potassium: 3.4 mmol/L — ABNORMAL LOW (ref 3.5–5.1)
Sodium: 138 mmol/L (ref 135–145)
Total Bilirubin: 0.9 mg/dL (ref 0.3–1.2)
Total Protein: 7.8 g/dL (ref 6.5–8.1)

## 2016-07-13 LAB — URINE MICROSCOPIC-ADD ON
BACTERIA UA: NONE SEEN
RBC / HPF: NONE SEEN RBC/hpf (ref 0–5)

## 2016-07-13 LAB — URINALYSIS, ROUTINE W REFLEX MICROSCOPIC
Bilirubin Urine: NEGATIVE
GLUCOSE, UA: NEGATIVE mg/dL
Hgb urine dipstick: NEGATIVE
Ketones, ur: NEGATIVE mg/dL
NITRITE: NEGATIVE
PROTEIN: NEGATIVE mg/dL
Specific Gravity, Urine: 1.029 (ref 1.005–1.030)
pH: 6 (ref 5.0–8.0)

## 2016-07-13 LAB — LIPASE, BLOOD: Lipase: 20 U/L (ref 11–51)

## 2016-07-13 LAB — I-STAT BETA HCG BLOOD, ED (MC, WL, AP ONLY): I-stat hCG, quantitative: <5 m[IU]/mL (ref <5)

## 2016-07-13 LAB — POC URINE PREG, ED: PREG TEST UR: NEGATIVE

## 2016-07-13 MED ORDER — IOPAMIDOL (ISOVUE-300) INJECTION 61%
30.0000 mL | Freq: Once | INTRAVENOUS | Status: AC | PRN
  Administered 2016-07-13: 15 mL via ORAL

## 2016-07-13 MED ORDER — ONDANSETRON HCL 4 MG/2ML IJ SOLN
4.0000 mg | Freq: Once | INTRAMUSCULAR | Status: AC
  Administered 2016-07-13: 4 mg via INTRAVENOUS
  Filled 2016-07-13: qty 2

## 2016-07-13 MED ORDER — IOPAMIDOL (ISOVUE-300) INJECTION 61%
100.0000 mL | Freq: Once | INTRAVENOUS | Status: AC | PRN
  Administered 2016-07-13: 80 mL via INTRAVENOUS

## 2016-07-13 MED ORDER — CEPHALEXIN 500 MG PO CAPS: 500.0000 mg | ORAL_CAPSULE | Freq: Two times a day (BID) | ORAL | 0 refills | Status: DC

## 2016-07-13 NOTE — ED Provider Notes (Signed)
[facility]-EMERGENCY DEPT Provider Note   CSN: [id number] Arrival date & time: 07/13/16  1723  By signing my name below, I, Placido SouLogan Joldersma, attest that this documentation has been prepared under the direction and in the presence of Newell RubbermaidJeffrey Ingris Pasquarella, PA-C. Electronically Signed: Placido SouLogan Joldersma, ED Scribe. 07/13/16. 7:10 PM.   History   Chief Complaint Chief Complaint  Patient presents with  . Abdominal Pain    HPI HPI Comments: Marie Cabrera is a 20 y.o. female who presents to the Emergency Department complaining of worsening, waxing and waning, 7/10, central abdominal pain x 2 days. She describes her pain as "a knot that is getting torn out".She reports this is in the upper abdomen, but hurts with palpation anywhere in the abdomen. She reports associated nausea, diarrhea and dysuria. She also reports vaginal discharge but states this is baseline for her and denies it has acutely worsened or changed. Pt is sexually active with a female partner and has recently had unprotected sex. Her LMP was "last month" and states that she typically has irregular menstrual cycles. She denies a h/o abdominal surgeries and does not have any children. Pt states she consumed ETOH last night but denies she "drank in excess" and denies any recent illegal narcotic use. She denies fevers, chills, vomiting, pelvic pain and constipation.   The history is provided by the patient. No language interpreter was used.    Past Medical History:  Diagnosis Date  . ADHD (attention deficit hyperactivity disorder)   . Amenorrhea    last menses 2007  . Asthma    last ospitalized >4 years ago.  NO recent problesm  . Heart murmur    [facility].  MD 2 d Echo 23012  . Rectal bleeding     Patient Active Problem List   Diagnosis Date Noted  . Alcohol-induced mood disorder ([facility]) 01/08/2016  . Intoxication 01/08/2016  . Substance abuse   . Oppositional defiant disorder 05/22/2012  . Parent-child relational problem  05/22/2012  . Family history of inflammatory bowel disease 03/06/2012  . Family history of colonic polyps 03/06/2012  . Hematochezia     Past Surgical History:  Procedure Laterality Date  . COLONOSCOPY  03/14/2012   Procedure: COLONOSCOPY;  Surgeon: Jon GillsJoseph H Clark, MD;  [facility];  Service: Gastroenterology;  Laterality: N/A;  . No surgical history      OB History    No data available       Home Medications    Prior to Admission medications   Medication Sig Start Date End Date Taking? Authorizing Provider  albuterol (PROVENTIL HFA;VENTOLIN HFA) 108 (90 Base) MCG/ACT inhaler Inhale 1 puff into the lungs every 6 (six) hours as needed for wheezing or shortness of breath (seasonal allergies).   Yes Historical Provider, MD  cephALEXin (KEFLEX) 500 MG capsule Take 1 capsule (500 mg total) by mouth 2 (two) times daily. 07/13/16   Eyvonne MechanicJeffrey Gael Londo, PA-C  citalopram (CELEXA) 20 MG tablet Take 1 tablet (20 mg total) by mouth daily after breakfast. Patient not taking: Reported on 07/13/2016 05/29/12   Jolene SchimkeKim B Winson, NP  dextroamphetamine (DEXTROSTAT) 5 MG tablet Take 1 tablet (5 mg total) by mouth 2 (two) times daily with breakfast and lunch. Patient not taking: Reported on 07/13/2016 05/29/12   Jolene SchimkeKim B Winson, NP    Family History Family History  Problem Relation Age of Onset  . Adopted: Yes  . Crohn's disease Maternal Aunt   . Colon polyps Maternal Aunt   . Colon  cancer Maternal Grandfather   . Alcohol abuse Mother   . Depression Mother   . Drug abuse Mother   . Mental illness Mother   . Alcohol abuse Father   . Drug abuse Father   . Mental illness Sister   . Alcohol abuse Maternal Grandmother   . Depression Maternal Grandmother   . Diabetes Maternal Grandmother   . Kidney disease Maternal Grandmother   . Other      Pt is adopted, only knows about maternal family    Social History Social History  Substance Use Topics  . Smoking status: Passive Smoke Exposure - Never Smoker    . Smokeless tobacco: Never Used  . Alcohol use No     Allergies   Review of patient's allergies indicates no known allergies.   Review of Systems Review of Systems  Constitutional: Negative for chills and fever.  Gastrointestinal: Positive for abdominal pain, diarrhea and nausea. Negative for constipation and vomiting.  Genitourinary: Positive for dysuria and vaginal discharge.    Physical Exam Updated Vital Signs BP 156/84   Pulse 113   Temp 98.1 F (36.7 C) (Oral)   Resp 18   Ht 5' (1.524 m)   Wt 51.7 kg   SpO2 99%   BMI 22.26 kg/m   Physical Exam  Constitutional: She is oriented to person, place, and time. She appears well-developed and well-nourished.  HENT:  Head: Normocephalic and atraumatic.  Eyes: EOM are normal.  Neck: Normal range of motion.  Cardiovascular: Normal rate.   Pulmonary/Chest: Effort normal. No respiratory distress.  Abdominal: Soft.  Diffuse abdominal tenderness to palpation  Musculoskeletal: Normal range of motion.  Neurological: She is alert and oriented to person, place, and time.  Skin: Skin is warm and dry.  Psychiatric: She has a normal mood and affect.  Nursing note and vitals reviewed.   ED Treatments / Results  Labs (all labs ordered are listed, but only abnormal results are displayed) Labs Reviewed  URINALYSIS, ROUTINE W REFLEX MICROSCOPIC (NOT [facility]) - Abnormal; Notable for the following:       Result Value   APPearance CLOUDY (*)    Leukocytes, UA MODERATE (*)    All other components within normal limits  COMPREHENSIVE METABOLIC PANEL - Abnormal; Notable for the following:    Potassium 3.4 (*)    All other components within normal limits  URINE MICROSCOPIC-ADD ON - Abnormal; Notable for the following:    Squamous Epithelial / LPF 0-5 (*)    All other components within normal limits  CBC WITH DIFFERENTIAL/PLATELET  LIPASE, BLOOD  POC URINE PREG, ED  I-STAT BETA HCG BLOOD, ED ([facility], [facility], [facility] ONLY)    EKG  EKG  Interpretation None       Radiology Ct Abdomen Pelvis W Contrast  Result Date: 07/13/2016 CLINICAL DATA:  Acute onset of bilateral upper quadrant abdominal pain. Nausea and dysuria. Initial encounter. EXAM: CT ABDOMEN AND PELVIS WITH CONTRAST TECHNIQUE: Multidetector CT imaging of the abdomen and pelvis was performed using the standard protocol following bolus administration of intravenous contrast. CONTRAST:  80mL ISOVUE-300 IOPAMIDOL (ISOVUE-300) INJECTION 61% COMPARISON:  None. FINDINGS: Lower chest: The visualized lung bases are grossly clear. The visualized portions of the mediastinum are unremarkable. Hepatobiliary: The liver is unremarkable in appearance. The gallbladder is unremarkable in appearance. The common bile duct remains normal in caliber. Pancreas: The pancreas is within normal limits. Spleen: The spleen is unremarkable in appearance. Adrenals/Urinary Tract: The adrenal glands are unremarkable in appearance. The  kidneys are within normal limits. There is no evidence of hydronephrosis. No renal or ureteral stones are identified. No perinephric stranding is seen. Stomach/Bowel: The stomach is unremarkable in appearance. The small bowel is within normal limits. The appendix is normal in caliber, without evidence of appendicitis. The colon is unremarkable in appearance. Vascular/Lymphatic: The abdominal aorta is unremarkable in appearance. The inferior vena cava is grossly unremarkable. No retroperitoneal lymphadenopathy is seen. No pelvic sidewall lymphadenopathy is identified. Reproductive: The bladder is mildly distended. Mild bladder wall thickening could reflect cystitis, depending on the patient's symptoms. The uterus is grossly unremarkable in appearance. The ovaries are relatively symmetric. No suspicious adnexal masses are seen. Other: No additional soft tissue abnormalities are seen. Musculoskeletal: No acute osseous abnormalities are identified. The visualized musculature is  unremarkable in appearance. IMPRESSION: 1. Mild bladder wall thickening could reflect cystitis, depending on the patient's symptoms. 2. No additional abnormality seen within the abdomen or pelvis. Electronically Signed   By: Roanna Raider M.D.   On: 07/13/2016 21:42    Procedures Procedures  DIAGNOSTIC STUDIES: Oxygen Saturation is 100% on RA, normal by my interpretation.    COORDINATION OF CARE: 7:09 PM Discussed next steps with pt. Pt verbalized understanding and is agreeable with the plan.    Medications Ordered in ED Medications  ondansetron (ZOFRAN) injection 4 mg (4 mg Intravenous Given 07/13/16 2150)  iopamidol (ISOVUE-300) 61 % injection 100 mL (80 mLs Intravenous Contrast Given 07/13/16 2127)  iopamidol (ISOVUE-300) 61 % injection 30 mL (15 mLs Oral Contrast Given 07/13/16 2050)    Initial Impression / Assessment and Plan / ED Course  I have reviewed the triage vital signs and the nursing notes.  Pertinent labs & imaging results that were available during my care of the patient were reviewed by me and considered in my medical decision making (see chart for details).  Clinical Course    I personally performed the services described in this documentation, which was scribed in my presence. The recorded information has been reviewed and is accurate.  Final Clinical Impressions(s) / ED Diagnoses   Final diagnoses:  Cystitis   Labs:  Imaging:  Consults:  Therapeutics:  Discharge Meds:   Assessment/Plan:20 year old female presents today with complaints of abdominal pain and nausea. Upon my initial evaluation patient appeared uncomfortable, had diffuse abdominal tenderness to light palpation. Basic labs were drawn, CT scan was ordered. Patient's labs returned showing no significant infectious etiology with the exception of her urine. Although showed no bacteria, she did have moderate leukocytes, 6-30 wbc's, urinary complaints, and CT findings consistent with cystitis.  Patient will be treated for acute cystitis. She'll be discharged home with symptomatic care instructions and strict return depressions. She verbalized understanding and agreement to today's plan had no further questions or concerns.     New Prescriptions New Prescriptions   CEPHALEXIN (KEFLEX) 500 MG CAPSULE    Take 1 capsule (500 mg total) by mouth 2 (two) times daily.     Eyvonne Mechanic, PA-C 07/13/16 2211    Cathren Laine, MD 07/14/16 [phone]

## 2016-07-13 NOTE — ED Triage Notes (Signed)
Pt states that she has been having abdominal pain x2 days. Pt states that the pain is in her bilateral upper quadrants. Denies V/D. Endorses nausea. Endorses burning with urination. Ambulatory. A&Ox4.

## 2016-07-13 NOTE — Discharge Instructions (Signed)
Please read attached information. If you experience any new or worsening signs or symptoms please return to the emergency room for evaluation. Please follow-up with your primary care provider or specialist as discussed. Please use medication prescribed only as directed and discontinue taking if you have any concerning signs or symptoms.   °

## 2016-09-10 NOTE — L&D Delivery Note (Signed)
Patient is 21 y.o. G2P0 [redacted]w[redacted]d admitted IOL secondary to preeclampsia. S/p IOL with foley bulb, cytotec, followed by Pitocin. AROM at 0056.  Prenatal course also complicated by limited prenatal care and anemia and GDM and GHTN.  Delivery Note At 6:09 PM a viable female was delivered via Vaginal, Spontaneous Delivery, LOA APGAR: 7, 9; weight pending .   Placenta status:intact, spontaneous. Cord: 3 vessel  Anesthesia:  epidural Episiotomy: None Lacerations: 1st degree Suture Repair: none Est. Blood Loss (mL): 450  Mom to postpartum.  Baby to Couplet care / Skin to Skin.   Upon arrival, patient was complete. She pushed with good maternal effort to deliver a viable female infant in cephalic, LOA position over intact perineum. Single nuchal cord was present, unable to reduce at the perineum so delivered baby through the nuchal with easy effort. Anterior shoulder delivered easily, followed by delivery of posterior shoulder. Baby was placed on maternal abdomen for oral suctioning, drying and stimulation. Delayed cord clamping performed. Placenta delivered spontaneously with gentle cord traction. Fundus firm with massage and Pitocin. Perineum inspected and found to have 1st degree laceration and bilateral labial lacerations, which was found to be hemostatic. No repair performed. Counts of sharps, instruments, and lap pads were all correct. Precepted by Zerita Boers, CNM.   Rolm Bookbinder, DO [place] Fellow

## 2016-10-07 ENCOUNTER — Emergency Department (HOSPITAL_COMMUNITY)
Admission: EM | Admit: 2016-10-07 | Discharge: 2016-10-07 | Disposition: A | Discharge status: Home / Self Care | Payer: Medicaid Other | Attending: Emergency Medicine | Admitting: Emergency Medicine

## 2016-10-07 ENCOUNTER — Encounter (HOSPITAL_COMMUNITY): Payer: Self-pay | Admitting: Emergency Medicine

## 2016-10-07 DIAGNOSIS — R69 Illness, unspecified: Secondary | ICD-10-CM

## 2016-10-07 DIAGNOSIS — F909 Attention-deficit hyperactivity disorder, unspecified type: Secondary | ICD-10-CM | POA: Insufficient documentation

## 2016-10-07 DIAGNOSIS — J111 Influenza due to unidentified influenza virus with other respiratory manifestations: Secondary | ICD-10-CM | POA: Insufficient documentation

## 2016-10-07 DIAGNOSIS — J45909 Unspecified asthma, uncomplicated: Secondary | ICD-10-CM | POA: Diagnosis not present

## 2016-10-07 DIAGNOSIS — Z3201 Encounter for pregnancy test, result positive: Secondary | ICD-10-CM

## 2016-10-07 DIAGNOSIS — R112 Nausea with vomiting, unspecified: Secondary | ICD-10-CM | POA: Diagnosis present

## 2016-10-07 LAB — COMPREHENSIVE METABOLIC PANEL
ALT: 18 U/L (ref 14–54)
AST: 25 U/L (ref 15–41)
Albumin: 4 g/dL (ref 3.5–5.0)
Alkaline Phosphatase: 56 U/L (ref 38–126)
Anion gap: 11 (ref 5–15)
BUN: 8 mg/dL (ref 6–20)
CHLORIDE: 105 mmol/L (ref 101–111)
CO2: 22 mmol/L (ref 22–32)
CREATININE: 0.73 mg/dL (ref 0.44–1.00)
Calcium: 9.4 mg/dL (ref 8.9–10.3)
GFR calc Af Amer: >60 mL/min (ref >60)
GLUCOSE: 91 mg/dL (ref 65–99)
Potassium: 3.1 mmol/L — ABNORMAL LOW (ref 3.5–5.1)
Sodium: 138 mmol/L (ref 135–145)
Total Bilirubin: 0.3 mg/dL (ref 0.3–1.2)
Total Protein: 6.9 g/dL (ref 6.5–8.1)

## 2016-10-07 LAB — CBC
HCT: 37 % (ref 36.0–46.0)
Hemoglobin: 12.4 g/dL (ref 12.0–15.0)
MCH: 27.5 pg (ref 26.0–34.0)
MCHC: 33.5 g/dL (ref 30.0–36.0)
MCV: 82 fL (ref 78.0–100.0)
Platelets: 204 10*3/uL (ref 150–400)
RBC: 4.51 MIL/uL (ref 3.87–5.11)
RDW: 13.9 % (ref 11.5–15.5)
WBC: 4.5 10*3/uL (ref 4.0–10.5)

## 2016-10-07 LAB — I-STAT BETA HCG BLOOD, ED (MC, WL, AP ONLY): I-stat hCG, quantitative: >2000 m[IU]/mL — ABNORMAL HIGH (ref <5)

## 2016-10-07 LAB — LIPASE, BLOOD: LIPASE: 22 U/L (ref 11–51)

## 2016-10-07 MED ORDER — OSELTAMIVIR PHOSPHATE 75 MG PO CAPS: 75.0000 mg | ORAL_CAPSULE | Freq: Two times a day (BID) | ORAL | 0 refills | Status: DC

## 2016-10-07 NOTE — ED Provider Notes (Signed)
[facility]-EMERGENCY DEPT Provider Note   CSN: [id number] Arrival date & time: 10/07/16  1450   By signing my name below, I, Marie Cabrera, attest that this documentation has been prepared under the direction and in the presence of Margarita Grizzle, MD  Electronically Signed: Clovis Pu, ED Scribe. 10/07/16. 5:34 PM.   History   Chief Complaint Chief Complaint  Patient presents with  . Nausea  . Emesis  . Chills    The history is provided by the patient. No language interpreter was used.   HPI Comments:  Marie Cabrera is a 21 y.o. female who presents to the Emergency Department complaining of intermittent nausea x 3 days. Pt also reports vomiting, productive cough, congestion, subjective fever and chills. She has taken Robitussin with no relief. Pt denies sore throat and any other associated symptoms at this time. Pt is sexually active but denies using protection or birth control use. Last menstrual period was at the beginning of 08/2016. Pt is a smoker. No flu shot this year.   Past Medical History:  Diagnosis Date  . ADHD (attention deficit hyperactivity disorder)   . Amenorrhea    last menses 2007  . Asthma    last ospitalized >4 years ago.  NO recent problesm  . Heart murmur    [facility].  MD 2 d Echo 23012  . Rectal bleeding     Patient Active Problem List   Diagnosis Date Noted  . Alcohol-induced mood disorder (HCC) 01/08/2016  . Intoxication 01/08/2016  . Substance abuse   . Oppositional defiant disorder 05/22/2012  . Parent-child relational problem 05/22/2012  . Family history of inflammatory bowel disease 03/06/2012  . Family history of colonic polyps 03/06/2012  . Hematochezia     Past Surgical History:  Procedure Laterality Date  . COLONOSCOPY  03/14/2012   Procedure: COLONOSCOPY;  Surgeon: Jon Gills, MD;  Location: [facility] OR;  Service: Gastroenterology;  Laterality: N/A;  . No surgical history      OB History    No data available        Home Medications    Prior to Admission medications   Medication Sig Start Date End Date Taking? Authorizing Provider  albuterol (PROVENTIL HFA;VENTOLIN HFA) 108 (90 Base) MCG/ACT inhaler Inhale 1 puff into the lungs every 6 (six) hours as needed for wheezing or shortness of breath (seasonal allergies).    Historical Provider, MD  cephALEXin (KEFLEX) 500 MG capsule Take 1 capsule (500 mg total) by mouth 2 (two) times daily. 07/13/16   Eyvonne Mechanic, PA-C  citalopram (CELEXA) 20 MG tablet Take 1 tablet (20 mg total) by mouth daily after breakfast. Patient not taking: Reported on 07/13/2016 05/29/12   Jolene Schimke, NP  dextroamphetamine (DEXTROSTAT) 5 MG tablet Take 1 tablet (5 mg total) by mouth 2 (two) times daily with breakfast and lunch. Patient not taking: Reported on 07/13/2016 05/29/12   Jolene Schimke, NP  oseltamivir (TAMIFLU) 75 MG capsule Take 1 capsule (75 mg total) by mouth every 12 (twelve) hours. 10/07/16   Margarita Grizzle, MD    Family History Family History  Problem Relation Age of Onset  . Adopted: Yes  . Crohn's disease Maternal Aunt   . Colon polyps Maternal Aunt   . Colon cancer Maternal Grandfather   . Alcohol abuse Mother   . Depression Mother   . Drug abuse Mother   . Mental illness Mother   . Alcohol abuse Father   . Drug abuse Father   .  Mental illness Sister   . Alcohol abuse Maternal Grandmother   . Depression Maternal Grandmother   . Diabetes Maternal Grandmother   . Kidney disease Maternal Grandmother   . Other      Pt is adopted, only knows about maternal family    Social History Social History  Substance Use Topics  . Smoking status: Never Smoker  . Smokeless tobacco: Never Used  . Alcohol use No     Allergies   Patient has no known allergies.   Review of Systems Review of Systems  Constitutional: Positive for chills and fever (subjective).  HENT: Positive for congestion. Negative for sore throat.   Respiratory: Positive for cough.    Gastrointestinal: Positive for nausea and vomiting.  All other systems reviewed and are negative.    Physical Exam Updated Vital Signs BP 124/58   Pulse 86   Temp 99 F (37.2 C) (Oral)   Resp 17   Ht 5' (1.524 m)   Wt 113 lb 9 oz (51.5 kg)   LMP 09/08/2016   SpO2 100%   BMI 22.18 kg/m   Physical Exam  Constitutional: She is oriented to person, place, and time. She appears well-developed and well-nourished. No distress.  HENT:  Head: Normocephalic and atraumatic.  Eyes: Conjunctivae are normal.  Cardiovascular: Normal rate.   Pulmonary/Chest: Effort normal.  Abdominal: She exhibits no distension.  Neurological: She is alert and oriented to person, place, and time.  Skin: Skin is warm and dry.  Psychiatric: She has a normal mood and affect.  Nursing note and vitals reviewed.    ED Treatments / Results  DIAGNOSTIC STUDIES:  Oxygen Saturation is 100% on RA, normal by my interpretation.    COORDINATION OF CARE:  5:33 PM Discussed treatment plan with pt at bedside and pt agreed to plan. 6:27 PM Discussed lab work with pt. Will prescribe Tamiflu.   Labs (all labs ordered are listed, but only abnormal results are displayed) Labs Reviewed  COMPREHENSIVE METABOLIC PANEL - Abnormal; Notable for the following:       Result Value   Potassium 3.1 (*)    All other components within normal limits  I-STAT BETA HCG BLOOD, ED ([facility], [facility], [facility] ONLY) - Abnormal; Notable for the following:    I-stat hCG, quantitative >2,000.0 (*)    All other components within normal limits  LIPASE, BLOOD  CBC  URINALYSIS, ROUTINE W REFLEX MICROSCOPIC    EKG  EKG Interpretation None       Radiology No results found.  Procedures Procedures (including critical care time)  Medications Ordered in ED Medications - No data to display   Initial Impression / Assessment and Plan / ED Course  I have reviewed the triage vital signs and the nursing notes.  Pertinent labs & imaging results  that were available during my care of the patient were reviewed by me and considered in my medical decision making (see chart for details).     Patient with symptoms consistent with influenza.  Vitals are stable, low-grade fever.  No signs of dehydration, tolerating PO's.  Lungs are clear. Due to patient's presentation and physical exam a chest x-Gaven Eugene was not ordered bc likely diagnosis of flu.  Patient will be prescribed Tamiflu, discharged with instructions to orally hydrate, rest, and use over-the-counter medications such as anti-inflammatories ibuprofen and Aleve for muscle aches and Tylenol for fever.   Final Clinical Impressions(s) / ED Diagnoses   Final diagnoses:  Influenza-like illness  Positive pregnancy test  New Prescriptions New Prescriptions   OSELTAMIVIR (TAMIFLU) 75 MG CAPSULE    Take 1 capsule (75 mg total) by mouth every 12 (twelve) hours.  I personally performed the services described in this documentation, which was scribed in my presence. The recorded information has been reviewed and considered.    Margarita Grizzle, MD 10/07/16 [phone]

## 2016-11-12 ENCOUNTER — Other Ambulatory Visit (HOSPITAL_COMMUNITY)
Admission: RE | Admit: 2016-11-12 | Discharge: 2016-11-12 | Disposition: A | Discharge status: Home / Self Care | Payer: Medicaid Other | Source: Ambulatory Visit | Attending: Certified Nurse Midwife | Admitting: Certified Nurse Midwife

## 2016-11-12 ENCOUNTER — Encounter: Payer: Self-pay | Admitting: Certified Nurse Midwife

## 2016-11-12 ENCOUNTER — Ambulatory Visit (INDEPENDENT_AMBULATORY_CARE_PROVIDER_SITE_OTHER): Payer: Medicaid Other | Admitting: Certified Nurse Midwife

## 2016-11-12 VITALS — BP 129/82 | HR 83 | Wt 114.0 lb

## 2016-11-12 DIAGNOSIS — Z3400 Encounter for supervision of normal first pregnancy, unspecified trimester: Secondary | ICD-10-CM

## 2016-11-12 DIAGNOSIS — O99341 Other mental disorders complicating pregnancy, first trimester: Secondary | ICD-10-CM

## 2016-11-12 DIAGNOSIS — Z01419 Encounter for gynecological examination (general) (routine) without abnormal findings: Secondary | ICD-10-CM | POA: Insufficient documentation

## 2016-11-12 DIAGNOSIS — F1910 Other psychoactive substance abuse, uncomplicated: Secondary | ICD-10-CM

## 2016-11-12 DIAGNOSIS — Z113 Encounter for screening for infections with a predominantly sexual mode of transmission: Secondary | ICD-10-CM | POA: Insufficient documentation

## 2016-11-12 DIAGNOSIS — O0993 Supervision of high risk pregnancy, unspecified, third trimester: Secondary | ICD-10-CM | POA: Insufficient documentation

## 2016-11-12 DIAGNOSIS — Z3481 Encounter for supervision of other normal pregnancy, first trimester: Secondary | ICD-10-CM

## 2016-11-12 MED ORDER — PRENATE PIXIE 10-0.6-0.4-200 MG PO CAPS: 1.0000 | ORAL_CAPSULE | Freq: Every day | ORAL | 12 refills | Status: DC

## 2016-11-12 NOTE — Progress Notes (Signed)
Subjective:    Marie Cabrera is being seen today for her first obstetrical visit.  This is a planned pregnancy. She is at 30w2dgestation. Her obstetrical history is significant for polysubstance abuse hx, no current use according to patient, hx of sex for money, denies any current.  [name] involved according to patient and mother.  Biological mother deceased from OD when she was a child. . Relationship with [name]: significant other, not living together. Patient does intend to breast feed. Pregnancy history fully reviewed.  The information documented in the HPI was reviewed and verified.  Menstrual History: OB History    Gravida Para Term Preterm AB Living   1             SAB TAB Ectopic Multiple Live Births                   Patient's last menstrual period was 09/08/2016.    Past Medical History:  Diagnosis Date  . ADHD (attention deficit hyperactivity disorder)   . Amenorrhea    last menses 2007  . Asthma    last ospitalized >4 years ago.  NO recent problesm  . Heart murmur    [facility].  MD 2 d Echo 23012  . Rectal bleeding     Past Surgical History:  Procedure Laterality Date  . COLONOSCOPY  03/14/2012   Procedure: COLONOSCOPY;  Surgeon: JOletha Blend MD;  [facility]  Service: Gastroenterology;  Laterality: N/A;  . No surgical history       (Not in a hospital admission) No Known Allergies  Social History  Substance Use Topics  . Smoking status: Never Smoker  . Smokeless tobacco: Never Used  . Alcohol use No    Family History  Problem Relation Age of Onset  . Adopted: Yes  . Crohn's disease Maternal Aunt   . Colon polyps Maternal Aunt   . Colon cancer Maternal Grandfather   . Alcohol abuse Mother   . Depression Mother   . Drug abuse Mother   . Mental illness Mother   . Alcohol abuse Father   . Drug abuse Father   . Mental illness Sister   . Alcohol abuse Maternal Grandmother   . Depression Maternal Grandmother   . Diabetes Maternal  Grandmother   . Kidney disease Maternal Grandmother   . Other      Pt is adopted, only knows about maternal family     Review of Systems Constitutional: negative for weight loss Gastrointestinal: negative for vomiting Genitourinary:negative for genital lesions and vaginal discharge and dysuria Musculoskeletal:negative for back pain Behavioral/Psych: negative for abusive relationship, depression, illegal drug usage and tobacco use    Objective:    BP 129/82   Pulse 83   Wt 114 lb (51.7 kg)   LMP 09/08/2016   BMI 22.26 kg/m  General Appearance:    Alert, cooperative, no distress, appears stated age  Head:    Normocephalic, without obvious abnormality, atraumatic  Eyes:    PERRL, conjunctiva/corneas clear, EOM's intact, fundi    benign, both eyes  Ears:    Normal TM's and external ear canals, both ears  Nose:   Nares normal, septum midline, mucosa normal, no drainage    or sinus tenderness  Throat:   Lips, mucosa, and tongue normal; teeth and gums normal  Neck:   Supple, symmetrical, trachea midline, no adenopathy;    thyroid:  no enlargement/tenderness/nodules; no carotid   bruit or JVD  Back:  Symmetric, no curvature, ROM normal, no CVA tenderness  Lungs:     Clear to auscultation bilaterally, respirations unlabored  Chest Wall:    No tenderness or deformity   Heart:    Regular rate and rhythm, S1 and S2 normal, no murmur, rub   or gallop  Breast Exam:    No tenderness, masses, or nipple abnormality  Abdomen:     Soft, non-tender, bowel sounds active all four quadrants,    no masses, no organomegaly  Genitalia:    Normal female without lesion, discharge or tenderness  Extremities:   Extremities normal, atraumatic, no cyanosis or edema  Pulses:   2+ and symmetric all extremities  Skin:   Skin color, texture, turgor normal, no rashes or lesions  Lymph nodes:   Cervical, supraclavicular, and axillary nodes normal  Neurologic:   CNII-XII intact, normal strength, sensation and  reflexes    throughout       Cervix: long, thick, closed and posterior.  FHR: 180 by doppler.  FH: about 9 week size, c/w dating.     Lab Review Urine pregnancy test Labs reviewed yes Radiologic studies reviewed no Assessment:    Pregnancy at 9w2dweeks   Supervision of normal first pregnancy, antepartum - Plan: Cytology - PAP, Cervicovaginal ancillary only, TSH, Hemoglobinopathy evaluation, Varicella zoster antibody, IgG, Culture, OB Urine, Hemoglobin A1c, Obstetric Panel, Including HIV, Cystic Fibrosis Mutation 97, Prenat-FeAsp-Meth-FA-DHA w/o A (PRENATE PIXIE) 10-0.6-0.4-200 MG CAPS  Substance abuse - Plan: ToxASSURE Select 13 (MW), Urine   Plan:     SW met with patient and gave her resources.  [facility] given [facility] classes.   Prenatal vitamins.  Counseling provided regarding continued use of seat belts, cessation of alcohol consumption, smoking or use of illicit drugs; infection precautions i.e., influenza/TDAP immunizations, toxoplasmosis,CMV, parvovirus, listeria and varicella; workplace safety, exercise during pregnancy; routine dental care, safe medications, sexual activity, hot tubs, saunas, pools, travel, caffeine use, fish and methlymercury, potential toxins, hair treatments, varicose veins Weight gain recommendations per IOM guidelines reviewed: underweight/BMI< 18.5--> gain 28 - 40 lbs; normal weight/BMI 18.5 - 24.9--> gain 25 - 35 lbs; overweight/BMI 25 - 29.9--> gain 15 - 25 lbs; obese/BMI >30->gain  11 - 20 lbs Problem list reviewed and updated. FIRST/CF mutation testing/NIPT/QUAD SCREEN/fragile X/Ashkenazi Jewish population testing/Spinal muscular atrophy discussed: requested. Role of ultrasound in pregnancy discussed; fetal survey: requested. Amniocentesis discussed: not indicated.  Meds ordered this encounter  Medications  . Prenat-FeAsp-Meth-FA-DHA w/o A (PRENATE PIXIE) 10-0.6-0.4-200 MG CAPS    Sig: Take 1 tablet by mouth daily.     Dispense:  30 capsule    Refill:  12    Please process coupon: Rx BIN: [id number] RxPCN: [id number], RxGRP:: [id number] RxID:: [id number] SUF: 01   Orders Placed This Encounter  Procedures  . Culture, OB Urine  . TSH  . Hemoglobinopathy evaluation  . Varicella zoster antibody, IgG  . Hemoglobin A1c  . Obstetric Panel, Including HIV  . Cystic Fibrosis Mutation 97  . ToxASSURE Select 13 (MW), Urine    Follow up in 4 weeks. 50% of 30 min visit spent on counseling and coordination of care.

## 2016-11-13 LAB — CERVICOVAGINAL ANCILLARY ONLY
BACTERIAL VAGINITIS: POSITIVE — AB
CANDIDA VAGINITIS: NEGATIVE
CHLAMYDIA, DNA PROBE: POSITIVE — AB
Neisseria Gonorrhea: NEGATIVE
Trichomonas: NEGATIVE

## 2016-11-14 ENCOUNTER — Other Ambulatory Visit: Payer: Self-pay | Admitting: Certified Nurse Midwife

## 2016-11-14 DIAGNOSIS — O98811 Other maternal infectious and parasitic diseases complicating pregnancy, first trimester: Principal | ICD-10-CM

## 2016-11-14 DIAGNOSIS — B9689 Other specified bacterial agents as the cause of diseases classified elsewhere: Secondary | ICD-10-CM

## 2016-11-14 DIAGNOSIS — N760 Acute vaginitis: Secondary | ICD-10-CM

## 2016-11-14 DIAGNOSIS — A749 Chlamydial infection, unspecified: Secondary | ICD-10-CM

## 2016-11-14 DIAGNOSIS — Z3400 Encounter for supervision of normal first pregnancy, unspecified trimester: Secondary | ICD-10-CM

## 2016-11-14 LAB — URINE CULTURE, OB REFLEX

## 2016-11-14 LAB — CYTOLOGY - PAP: Diagnosis: NEGATIVE

## 2016-11-14 LAB — CULTURE, OB URINE

## 2016-11-14 MED ORDER — METRONIDAZOLE 0.75 % VA GEL: 1.0000 | Freq: Two times a day (BID) | VAGINAL | 0 refills | Status: DC

## 2016-11-14 MED ORDER — AZITHROMYCIN 250 MG PO TABS: ORAL_TABLET | ORAL | 0 refills | Status: DC

## 2016-11-15 ENCOUNTER — Telehealth: Payer: Self-pay

## 2016-11-15 NOTE — Telephone Encounter (Signed)
Contacted patient and advised of results, and treatment plan.

## 2016-11-17 LAB — TOXASSURE SELECT 13 (MW), URINE

## 2016-11-19 ENCOUNTER — Other Ambulatory Visit: Payer: Self-pay | Admitting: Certified Nurse Midwife

## 2016-11-19 DIAGNOSIS — Z3400 Encounter for supervision of normal first pregnancy, unspecified trimester: Secondary | ICD-10-CM

## 2016-11-19 DIAGNOSIS — F1210 Cannabis abuse, uncomplicated: Secondary | ICD-10-CM | POA: Insufficient documentation

## 2016-11-19 LAB — TSH: TSH: 1.03 u[IU]/mL (ref 0.450–4.500)

## 2016-11-19 LAB — HEMOGLOBINOPATHY EVALUATION
HGB C: 0 %
HGB S: 0 %
HGB VARIANT: 0 %
Hemoglobin A2 Quantitation: 2.6 % (ref 1.8–3.2)
Hemoglobin F Quantitation: 0 % (ref 0.0–2.0)
Hgb A: 97.4 % (ref 96.4–98.8)

## 2016-11-19 LAB — OBSTETRIC PANEL, INCLUDING HIV
ANTIBODY SCREEN: NEGATIVE
BASOS: 0 %
Basophils Absolute: 0 10*3/uL (ref 0.0–0.2)
EOS (ABSOLUTE): 0.2 10*3/uL (ref 0.0–0.4)
Eos: 2 %
HEP B S AG: NEGATIVE
HIV SCREEN 4TH GENERATION: NONREACTIVE
Hematocrit: 35.6 % (ref 34.0–46.6)
Hemoglobin: 12.4 g/dL (ref 11.1–15.9)
Immature Grans (Abs): 0 10*3/uL (ref 0.0–0.1)
Immature Granulocytes: 0 %
LYMPHS ABS: 2.3 10*3/uL (ref 0.7–3.1)
Lymphs: 25 %
MCH: 28.2 pg (ref 26.6–33.0)
MCHC: 34.8 g/dL (ref 31.5–35.7)
MCV: 81 fL (ref 79–97)
Monocytes Absolute: 0.6 10*3/uL (ref 0.1–0.9)
Monocytes: 6 %
NEUTROS ABS: 6.2 10*3/uL (ref 1.4–7.0)
Neutrophils: 67 %
Platelets: 279 10*3/uL (ref 150–379)
RBC: 4.4 x10E6/uL (ref 3.77–5.28)
RDW: 14.1 % (ref 12.3–15.4)
RPR: NONREACTIVE
Rh Factor: POSITIVE
Rubella Antibodies, IGG: 4.18 index (ref >0.99)
WBC: 9.3 10*3/uL (ref 3.4–10.8)

## 2016-11-19 LAB — CYSTIC FIBROSIS MUTATION 97: Interpretation: NOT DETECTED

## 2016-11-19 LAB — VARICELLA ZOSTER ANTIBODY, IGG: Varicella zoster IgG: 168 index (ref >165)

## 2016-11-19 LAB — HEMOGLOBIN A1C
Est. average glucose Bld gHb Est-mCnc: 105 mg/dL
Hgb A1c MFr Bld: 5.3 % (ref 4.8–5.6)

## 2016-12-13 ENCOUNTER — Encounter: Payer: Self-pay | Admitting: Certified Nurse Midwife

## 2016-12-13 ENCOUNTER — Other Ambulatory Visit (HOSPITAL_COMMUNITY)
Admission: RE | Admit: 2016-12-13 | Discharge: 2016-12-13 | Disposition: A | Discharge status: Home / Self Care | Payer: Medicaid Other | Source: Ambulatory Visit | Attending: Certified Nurse Midwife | Admitting: Certified Nurse Midwife

## 2016-12-13 ENCOUNTER — Ambulatory Visit (INDEPENDENT_AMBULATORY_CARE_PROVIDER_SITE_OTHER): Payer: Medicaid Other | Admitting: Certified Nurse Midwife

## 2016-12-13 VITALS — BP 137/76 | HR 94 | Wt 121.0 lb

## 2016-12-13 DIAGNOSIS — A749 Chlamydial infection, unspecified: Secondary | ICD-10-CM

## 2016-12-13 DIAGNOSIS — Z113 Encounter for screening for infections with a predominantly sexual mode of transmission: Secondary | ICD-10-CM | POA: Diagnosis not present

## 2016-12-13 DIAGNOSIS — O26892 Other specified pregnancy related conditions, second trimester: Secondary | ICD-10-CM

## 2016-12-13 DIAGNOSIS — R51 Headache: Secondary | ICD-10-CM

## 2016-12-13 DIAGNOSIS — O98311 Other infections with a predominantly sexual mode of transmission complicating pregnancy, first trimester: Secondary | ICD-10-CM

## 2016-12-13 DIAGNOSIS — O98811 Other maternal infectious and parasitic diseases complicating pregnancy, first trimester: Secondary | ICD-10-CM

## 2016-12-13 DIAGNOSIS — Z3400 Encounter for supervision of normal first pregnancy, unspecified trimester: Secondary | ICD-10-CM

## 2016-12-13 DIAGNOSIS — O219 Vomiting of pregnancy, unspecified: Secondary | ICD-10-CM

## 2016-12-13 MED ORDER — DOXYLAMINE-PYRIDOXINE 10-10 MG PO TBEC: DELAYED_RELEASE_TABLET | ORAL | 4 refills | Status: DC

## 2016-12-13 MED ORDER — ONDANSETRON HCL 8 MG PO TABS: 8.0000 mg | ORAL_TABLET | Freq: Three times a day (TID) | ORAL | 2 refills | Status: DC | PRN

## 2016-12-13 MED ORDER — BUTALBITAL-APAP-CAFFEINE 50-325-40 MG PO TABS: 1.0000 | ORAL_TABLET | Freq: Four times a day (QID) | ORAL | 4 refills | Status: DC | PRN

## 2016-12-13 NOTE — Progress Notes (Signed)
Patient reports she is doing well 

## 2016-12-13 NOTE — Progress Notes (Signed)
   PRENATAL VISIT NOTE  Subjective:  Marie Cabrera is a 21 y.o. G1P0 at [redacted]w[redacted]d being seen today for ongoing prenatal care.  She is currently monitored for the following issues for this low-risk pregnancy and has Hematochezia; Family history of inflammatory bowel disease; Family history of colonic polyps; Oppositional defiant disorder; Alcohol-induced mood disorder (HCC); Substance abuse; Supervision of normal pregnancy, antepartum; Chlamydia infection affecting pregnancy in first trimester; and Mild tetrahydrocannabinol (THC) abuse on her problem list.  Patient reports no complaints.  Contractions: Not present. Vag. Bleeding: None.   . Denies leaking of fluid.   The following portions of the patient's history were reviewed and updated as appropriate: allergies, current medications, past family history, past medical history, past social history, past surgical history and problem list. Problem list updated.  Objective:   Vitals:   12/13/16 1022  BP: 137/76  Pulse: 94  Weight: 121 lb (54.9 kg)    Fetal Status: Fetal Heart Rate (bpm): 155         General:  Alert, oriented and cooperative. Patient is in no acute distress.  Skin: Skin is warm and dry. No rash noted.   Cardiovascular: Normal heart rate noted  Respiratory: Normal respiratory effort, no problems with respiration noted  Abdomen: Soft, gravid, appropriate for gestational age. Pain/Pressure: Absent     Pelvic:  Cervical exam deferred        Extremities: Normal range of motion.  Edema: None  Mental Status: Normal mood and affect. Normal behavior. Normal judgment and thought content.   Assessment and Plan:  Pregnancy: G1P0 at [redacted]w[redacted]d  1. Supervision of normal first pregnancy, antepartum - MaterniT21 PLUS Core+SCA - [place] MFM OB COMP + 14 WK; Future  2. Chlamydia infection affecting pregnancy in first trimester - GC/Chlamydia probe amp ([place])not at [facility]  3. Nausea and vomiting during pregnancy prior to [redacted] weeks  gestation - Doxylamine-Pyridoxine (DICLEGIS) 10-10 MG TBEC; Take 1 tablet with breakfast and lunch.  Take 2 tablets at bedtime.  Dispense: 100 tablet; Refill: 4 - ondansetron (ZOFRAN) 8 MG tablet; Take 1 tablet (8 mg total) by mouth every 8 (eight) hours as needed for nausea or vomiting.  Dispense: 40 tablet; Refill: 2  4. Headache in pregnancy, antepartum, second trimester - butalbital-acetaminophen-caffeine (FIORICET, ESGIC) 50-325-40 MG tablet; Take 1-2 tablets by mouth every 6 (six) hours as needed.  Dispense: 45 tablet; Refill: 4  Preterm labor symptoms and general obstetric precautions including but not limited to vaginal bleeding, contractions, leaking of fluid and fetal movement were reviewed in detail with the patient. Please refer to After Visit Summary for other counseling recommendations.  4wrk rob f/up  Elinor Parkinson, Student-MidWife

## 2016-12-13 NOTE — Progress Notes (Signed)
PA for Diclegis submitted and approved. Pharmacy aware.

## 2016-12-14 LAB — GC/CHLAMYDIA PROBE AMP (~~LOC~~) NOT AT ARMC
CHLAMYDIA, DNA PROBE: NEGATIVE
NEISSERIA GONORRHEA: NEGATIVE

## 2016-12-19 ENCOUNTER — Other Ambulatory Visit: Payer: Self-pay | Admitting: Certified Nurse Midwife

## 2016-12-19 DIAGNOSIS — Z3400 Encounter for supervision of normal first pregnancy, unspecified trimester: Secondary | ICD-10-CM

## 2016-12-19 LAB — MATERNIT21 PLUS CORE+SCA
CHROMOSOME 21: NEGATIVE
Chromosome 13: NEGATIVE
Chromosome 18: NEGATIVE
Y Chromosome: DETECTED

## 2017-01-14 ENCOUNTER — Encounter: Payer: Medicaid Other | Admitting: Certified Nurse Midwife

## 2017-01-16 ENCOUNTER — Ambulatory Visit (HOSPITAL_COMMUNITY): Payer: Medicaid Other

## 2017-01-17 ENCOUNTER — Encounter: Payer: Medicaid Other | Admitting: Certified Nurse Midwife

## 2017-02-25 ENCOUNTER — Encounter: Payer: Self-pay | Admitting: Certified Nurse Midwife

## 2017-02-25 ENCOUNTER — Ambulatory Visit (INDEPENDENT_AMBULATORY_CARE_PROVIDER_SITE_OTHER): Payer: Medicaid Other | Admitting: Certified Nurse Midwife

## 2017-02-25 VITALS — BP 148/76 | HR 107 | Wt 129.8 lb

## 2017-02-25 DIAGNOSIS — O98811 Other maternal infectious and parasitic diseases complicating pregnancy, first trimester: Secondary | ICD-10-CM

## 2017-02-25 DIAGNOSIS — A749 Chlamydial infection, unspecified: Secondary | ICD-10-CM

## 2017-02-25 DIAGNOSIS — Z3400 Encounter for supervision of normal first pregnancy, unspecified trimester: Secondary | ICD-10-CM

## 2017-02-25 DIAGNOSIS — R011 Cardiac murmur, unspecified: Secondary | ICD-10-CM

## 2017-02-25 DIAGNOSIS — Z3482 Encounter for supervision of other normal pregnancy, second trimester: Secondary | ICD-10-CM

## 2017-02-25 DIAGNOSIS — O0932 Supervision of pregnancy with insufficient antenatal care, second trimester: Secondary | ICD-10-CM

## 2017-02-25 DIAGNOSIS — O162 Unspecified maternal hypertension, second trimester: Secondary | ICD-10-CM

## 2017-02-25 MED ORDER — PRENATE PIXIE 10-0.6-0.4-200 MG PO CAPS: 1.0000 | ORAL_CAPSULE | Freq: Every day | ORAL | 12 refills | Status: DC

## 2017-02-25 NOTE — Progress Notes (Signed)
PRENATAL VISIT NOTE  Subjective:  Marie Cabrera is a 21 y.o. G1P0 at [redacted]w[redacted]d being seen today for ongoing prenatal care.  She is currently monitored for the following issues for this low-risk pregnancy and has Hematochezia; Family history of inflammatory bowel disease; Family history of colonic polyps; Oppositional defiant disorder; Alcohol-induced mood disorder (HCC); Substance abuse; Supervision of normal pregnancy, antepartum; Chlamydia infection affecting pregnancy in first trimester; Mild tetrahydrocannabinol (THC) abuse; and Limited prenatal care in second trimester on her problem list.  Patient reports fatigue, no bleeding, no contractions, no cramping, no leaking and vertigo, hx of heart murmur.  Contractions: Not present. Vag. Bleeding: None.  Movement: Present. Denies leaking of fluid.   The following portions of the patient's history were reviewed and updated as appropriate: allergies, current medications, past family history, past medical history, past social history, past surgical history and problem list. Problem list updated.  Objective:   Vitals:   02/25/17 1559  BP: (!) 148/76  Pulse: (!) 107  Weight: 129 lb 12.8 oz (58.9 kg)    Fetal Status: Fetal Heart Rate (bpm): 159 Fundal Height: 23 cm Movement: Present     General:  Alert, oriented and cooperative. Patient is in no acute distress.  Skin: Skin is warm and dry. No rash noted.   Cardiovascular: Normal heart rate noted  Respiratory: Normal respiratory effort, no problems with respiration noted  Abdomen: Soft, gravid, appropriate for gestational age. Pain/Pressure: Absent     Pelvic:  Cervical exam deferred        Extremities: Normal range of motion.  Edema: None  Mental Status: Normal mood and affect. Normal behavior. Normal judgment and thought content.   Assessment and Plan:  Pregnancy: G1P0 at [redacted]w[redacted]d  1. Supervision of normal first pregnancy, antepartum      - US MFM OB COMP + 14 WK; Future  2. Chlamydia  infection affecting pregnancy in first trimester     TOC negative April.   3. Heart murmur     As a child, not mentioned until today, has not had f/u.   - Ambulatory referral to Cardiology  4. Limited prenatal care in second trimester     2 prenatal visits as of toay  5. Elevated blood pressure affecting pregnancy in second trimester, antepartum     No hx of HTN, diet discussed.  Increased water intake encouraged.  - CBC - Protein / creatinine ratio, urine - Comp Met (CMET) - Creatinine clearance, urine, 24 hour; Future - Protein, urine, 24 hour; Future  Preterm labor symptoms and general obstetric precautions including but not limited to vaginal bleeding, contractions, leaking of fluid and fetal movement were reviewed in detail with the patient. Please refer to After Visit Summary for other counseling recommendations.  Return in about 4 weeks (around 03/25/2017) for ROB, 2 hr OGTT. 1 week blood pressure check.     A , CNM  

## 2017-02-25 NOTE — Patient Instructions (Addendum)
AREA PEDIATRIC/FAMILY PRACTICE PHYSICIANS  [facility] [address], [address] [place], [place]  [address] Phone - [phone]   Fax - [phone]  [facility] [address] [place], [place] [address] Phone - [phone]   Fax - [phone]  J[facility] AM[facility] [address] [place], [place]  [address] Phone - [phone]   Fax - [phone]  [facility] [address] [place], [place]  [address] Phone - [phone]   Fax - [phone]  [facility] [address] [place], [place]  [address] Phone - [phone]   Fax - [phone]  [facility] [address] [place], [place]  [address] Phone - [phone]   Fax - [phone]  [facility] [address] [place], [place]  [address] Phone - [phone]   Fax - [phone]  [facility] [address], [address] [place], [place]  [address] Phone - [phone]   Fax - [phone]  [facility] [address] [place], [place]  [address] Phone - [phone]   Fax - [phone] [facility] [address] [place], [place]  [address] Phone - [phone]   Fax - [phone]  [facility] [address] [place], [place]  [address] Phone - [phone]   Fax - [phone]  [facility] [address] [place], [place]  [address] Phone - [phone]   Fax - [phone]  [facility] [address], [address] [place], [place]  [address] Phone - [phone]   Fax - [phone]  SHILPA GOSRANI [address] [place], [place]  [address] Phone - [phone]  [facility] [address] [place], [place]  [address] Phone - [phone]   Fax - [phone]  [facility] [address] [place], [place]  [address] Phone - [phone]   Fax - [phone]  [facility] [address] [place], [place]  [address] Phone - [phone]   Fax - [phone]  [facility] [address] [place], [place]  [address] Phone - [phone]   Fax - [phone]   [facility] [address] [place], [place]  [address] Phone - [phone]   Fax - [phone]  [facility] [address] [place], [place]  [address] Phone - [phone]   Fax - [phone]  DAVID RUBIN [address] [place], [place]  [address] Phone - [phone]   Fax - [phone]  [facility] [address] [place], [place]  [address] Phone - [phone]   Fax - [phone]  [facility] [address] , [place]  [address] Phone - [phone]   Fax - [phone] [facility] [address] [place], [place]  [address] Phone - [phone]   Fax - [phone]  [facility] [address] [place], [place]  [address] Phone - [phone]   Fax - [phone]  [facility] [address] [place],   [address] Phone - [phone]   Fax - [phone]  [facility] Charlene Flemming MD [address] [place]  [address] Phone [phone]  Fax [phone]  Contraception Choices Contraception (birth control) is the use of any methods or devices to prevent   pregnancy. Below are some methods to help avoid pregnancy. Hormonal methods  Contraceptive implant. This is a thin, plastic tube containing progesterone hormone. It does not contain estrogen hormone. Your health care provider inserts the tube in the inner part of the upper arm. The tube can remain in place for up to 3 years. After 3 years, the implant must be removed. The implant prevents the  ovaries from releasing an egg (ovulation), thickens the cervical mucus to prevent sperm from entering the uterus, and thins the lining of the inside of the uterus.  Progesterone-only injections. These injections are given every 3 months by your health care provider to prevent pregnancy. This synthetic progesterone hormone stops the ovaries from releasing eggs. It also thickens cervical mucus and changes the uterine lining. This makes it harder for sperm to survive in the uterus.  Birth control pills. These pills contain estrogen and progesterone hormone. They work by preventing the ovaries from releasing eggs (ovulation). They also cause the cervical mucus to thicken, preventing the sperm from entering the uterus. Birth control pills are prescribed by a health care provider.Birth control pills can also be used to treat heavy periods.  Minipill. This type of birth control pill contains only the progesterone hormone. They are taken every day of each month and must be prescribed by your health care provider.  Birth control patch. The patch contains hormones similar to those in birth control pills. It must be changed once a week and is prescribed by a health care provider.  Vaginal ring. The ring contains hormones similar to those in birth control pills. It is left in the vagina for 3 weeks, removed for 1 week, and then a new one is put back in place. The patient must be comfortable inserting and removing the ring from the vagina.A health care provider's prescription is necessary.  Emergency contraception. Emergency contraceptives prevent pregnancy after unprotected sexual intercourse. This pill can be taken right after sex or up to 5 days after unprotected sex. It is most effective the sooner you take the pills after having sexual intercourse. Most emergency contraceptive pills are available without a prescription. Check with your pharmacist. Do not use emergency contraception as your only form of birth  control. Barrier methods  Female condom. This is a thin sheath (latex or rubber) that is worn over the penis during sexual intercourse. It can be used with spermicide to increase effectiveness.  Female condom. This is a soft, loose-fitting sheath that is put into the vagina before sexual intercourse.  Diaphragm. This is a soft, latex, dome-shaped barrier that must be fitted by a health care provider. It is inserted into the vagina, along with a spermicidal jelly. It is inserted before intercourse. The diaphragm should be left in the vagina for 6 to 8 hours after intercourse.  Cervical cap. This is a round, soft, latex or plastic cup that fits over the cervix and must be fitted by a health care provider. The cap can be left in place for up to 48 hours after intercourse.  Sponge. This is a soft, circular piece of polyurethane foam. The sponge has spermicide in it. It is inserted into the vagina after wetting it and before sexual intercourse.  Spermicides. These are chemicals that kill or block sperm from entering the cervix and uterus. They come in the form of creams, jellies, suppositories, foam, or tablets. They do not require a prescription. They are inserted into the vagina with an applicator before having sexual intercourse. The process   must be repeated every time you have sexual intercourse. Intrauterine contraception  Intrauterine device (IUD). This is a T-shaped device that is put in a woman's uterus during a menstrual period to prevent pregnancy. There are 2 types: ? Copper IUD. This type of IUD is wrapped in copper wire and is placed inside the uterus. Copper makes the uterus and fallopian tubes produce a fluid that kills sperm. It can stay in place for 10 years. ? Hormone IUD. This type of IUD contains the hormone progestin (synthetic progesterone). The hormone thickens the cervical mucus and prevents sperm from entering the uterus, and it also thins the uterine lining to prevent  implantation of a fertilized egg. The hormone can weaken or kill the sperm that get into the uterus. It can stay in place for 3-5 years, depending on which type of IUD is used. Permanent methods of contraception  Female tubal ligation. This is when the woman's fallopian tubes are surgically sealed, tied, or blocked to prevent the egg from traveling to the uterus.  Hysteroscopic sterilization. This involves placing a small coil or insert into each fallopian tube. Your doctor uses a technique called hysteroscopy to do the procedure. The device causes scar tissue to form. This results in permanent blockage of the fallopian tubes, so the sperm cannot fertilize the egg. It takes about 3 months after the procedure for the tubes to become blocked. You must use another form of birth control for these 3 months.  Female sterilization. This is when the female has the tubes that carry sperm tied off (vasectomy).This blocks sperm from entering the vagina during sexual intercourse. After the procedure, the man can still ejaculate fluid (semen). Natural planning methods  Natural family planning. This is not having sexual intercourse or using a barrier method (condom, diaphragm, cervical cap) on days the woman could become pregnant.  Calendar method. This is keeping track of the length of each menstrual cycle and identifying when you are fertile.  Ovulation method. This is avoiding sexual intercourse during ovulation.  Symptothermal method. This is avoiding sexual intercourse during ovulation, using a thermometer and ovulation symptoms.  Post-ovulation method. This is timing sexual intercourse after you have ovulated. Regardless of which type or method of contraception you choose, it is important that you use condoms to protect against the transmission of sexually transmitted infections (STIs). Talk with your health care provider about which form of contraception is most appropriate for you. This information is not  intended to replace advice given to you by your health care provider. Make sure you discuss any questions you have with your health care provider. Document Released: 08/27/2005 Document Revised: 02/02/2016 Document Reviewed: 02/19/2013 Elsevier Interactive Patient Education  2017 Elsevier Inc.  

## 2017-02-25 NOTE — Progress Notes (Signed)
Patient complaining of episode of her heart beating quickly and dizziness. Patient reports she has heart murmur. Marie StammerJennifer Melayah Cabrera RNBSN

## 2017-02-26 LAB — COMPREHENSIVE METABOLIC PANEL
A/G RATIO: 1.4 (ref 1.2–2.2)
ALK PHOS: 71 IU/L (ref 39–117)
ALT: 52 IU/L — AB (ref 0–32)
AST: 36 IU/L (ref 0–40)
Albumin: 3.5 g/dL (ref 3.5–5.5)
BUN/Creatinine Ratio: 21 (ref 9–23)
BUN: 8 mg/dL (ref 6–20)
Bilirubin Total: <0.2 mg/dL (ref 0.0–1.2)
CHLORIDE: 106 mmol/L (ref 96–106)
CO2: 20 mmol/L (ref 20–29)
Calcium: 8.9 mg/dL (ref 8.7–10.2)
Creatinine, Ser: 0.39 mg/dL — ABNORMAL LOW (ref 0.57–1.00)
GFR calc non Af Amer: 150 mL/min/{1.73_m2} (ref >59)
GFR, EST AFRICAN AMERICAN: 172 mL/min/{1.73_m2} (ref >59)
Globulin, Total: 2.5 g/dL (ref 1.5–4.5)
Glucose: 94 mg/dL (ref 65–99)
POTASSIUM: 3.9 mmol/L (ref 3.5–5.2)
Sodium: 139 mmol/L (ref 134–144)
TOTAL PROTEIN: 6 g/dL (ref 6.0–8.5)

## 2017-02-26 LAB — CBC
Hematocrit: 30.5 % — ABNORMAL LOW (ref 34.0–46.6)
Hemoglobin: 10 g/dL — ABNORMAL LOW (ref 11.1–15.9)
MCH: 28.1 pg (ref 26.6–33.0)
MCHC: 32.8 g/dL (ref 31.5–35.7)
MCV: 86 fL (ref 79–97)
PLATELETS: 234 10*3/uL (ref 150–379)
RBC: 3.56 x10E6/uL — ABNORMAL LOW (ref 3.77–5.28)
RDW: 15 % (ref 12.3–15.4)
WBC: 11.5 10*3/uL — AB (ref 3.4–10.8)

## 2017-02-26 LAB — PROTEIN / CREATININE RATIO, URINE
Creatinine, Urine: 127.4 mg/dL
Protein, Ur: 13 mg/dL
Protein/Creat Ratio: 102 mg/g creat (ref 0–200)

## 2017-02-27 ENCOUNTER — Telehealth: Payer: Self-pay

## 2017-02-27 ENCOUNTER — Other Ambulatory Visit: Payer: Self-pay | Admitting: Certified Nurse Midwife

## 2017-02-27 DIAGNOSIS — O99013 Anemia complicating pregnancy, third trimester: Secondary | ICD-10-CM

## 2017-02-27 MED ORDER — CITRANATAL BLOOM 90-1 MG PO TABS: 1.0000 | ORAL_TABLET | Freq: Every day | ORAL | 12 refills | Status: DC

## 2017-02-27 NOTE — Telephone Encounter (Signed)
Advised of results and Bloom PNV that were sent to pharmacy.

## 2017-03-01 ENCOUNTER — Encounter: Payer: Self-pay | Admitting: Physician Assistant

## 2017-03-01 ENCOUNTER — Other Ambulatory Visit: Payer: Self-pay | Admitting: *Deleted

## 2017-03-01 DIAGNOSIS — Z3A24 24 weeks gestation of pregnancy: Secondary | ICD-10-CM

## 2017-03-05 ENCOUNTER — Ambulatory Visit: Payer: Medicaid Other

## 2017-03-05 VITALS — BP 129/72 | Wt 130.0 lb

## 2017-03-05 DIAGNOSIS — Z3400 Encounter for supervision of normal first pregnancy, unspecified trimester: Secondary | ICD-10-CM

## 2017-03-05 NOTE — Progress Notes (Signed)
Patient presents for BP check - BP 129/72 Pulse 95.  Consulted with attending Dr. Patient was advised that BP is good and she could go per Dr. Alysia PennaErvin.

## 2017-03-07 ENCOUNTER — Encounter (HOSPITAL_COMMUNITY): Payer: Self-pay

## 2017-03-07 ENCOUNTER — Ambulatory Visit (HOSPITAL_COMMUNITY)
Admission: RE | Admit: 2017-03-07 | Discharge: 2017-03-07 | Disposition: A | Discharge status: Home / Self Care | Payer: Medicaid Other | Source: Ambulatory Visit | Attending: Obstetrics | Admitting: Obstetrics

## 2017-03-07 ENCOUNTER — Other Ambulatory Visit: Payer: Self-pay | Admitting: Obstetrics

## 2017-03-07 ENCOUNTER — Ambulatory Visit (HOSPITAL_COMMUNITY): Admission: RE | Admit: 2017-03-07 | Payer: Medicaid Other | Source: Ambulatory Visit

## 2017-03-07 DIAGNOSIS — O132 Gestational [pregnancy-induced] hypertension without significant proteinuria, second trimester: Secondary | ICD-10-CM

## 2017-03-07 DIAGNOSIS — O0932 Supervision of pregnancy with insufficient antenatal care, second trimester: Secondary | ICD-10-CM | POA: Diagnosis not present

## 2017-03-07 DIAGNOSIS — Z3A25 25 weeks gestation of pregnancy: Secondary | ICD-10-CM

## 2017-03-07 DIAGNOSIS — Z369 Encounter for antenatal screening, unspecified: Secondary | ICD-10-CM

## 2017-03-07 DIAGNOSIS — O99322 Drug use complicating pregnancy, second trimester: Secondary | ICD-10-CM | POA: Diagnosis not present

## 2017-03-07 DIAGNOSIS — Z3400 Encounter for supervision of normal first pregnancy, unspecified trimester: Secondary | ICD-10-CM

## 2017-03-07 DIAGNOSIS — F1910 Other psychoactive substance abuse, uncomplicated: Secondary | ICD-10-CM | POA: Diagnosis not present

## 2017-03-07 DIAGNOSIS — Z3A24 24 weeks gestation of pregnancy: Secondary | ICD-10-CM

## 2017-03-07 DIAGNOSIS — Z363 Encounter for antenatal screening for malformations: Secondary | ICD-10-CM | POA: Insufficient documentation

## 2017-03-08 ENCOUNTER — Other Ambulatory Visit (HOSPITAL_COMMUNITY): Payer: Self-pay | Admitting: *Deleted

## 2017-03-08 DIAGNOSIS — IMO0002 Reserved for concepts with insufficient information to code with codable children: Secondary | ICD-10-CM

## 2017-03-08 DIAGNOSIS — Z0489 Encounter for examination and observation for other specified reasons: Secondary | ICD-10-CM

## 2017-03-20 ENCOUNTER — Encounter: Payer: Self-pay | Admitting: Physician Assistant

## 2017-03-20 ENCOUNTER — Encounter (INDEPENDENT_AMBULATORY_CARE_PROVIDER_SITE_OTHER): Payer: Self-pay

## 2017-03-20 ENCOUNTER — Ambulatory Visit (INDEPENDENT_AMBULATORY_CARE_PROVIDER_SITE_OTHER): Payer: Medicaid Other | Admitting: Physician Assistant

## 2017-03-20 VITALS — BP 122/74 | HR 104 | Ht <= 58 in | Wt 135.8 lb

## 2017-03-20 DIAGNOSIS — R42 Dizziness and giddiness: Secondary | ICD-10-CM

## 2017-03-20 DIAGNOSIS — R002 Palpitations: Secondary | ICD-10-CM | POA: Diagnosis not present

## 2017-03-20 DIAGNOSIS — Z3490 Encounter for supervision of normal pregnancy, unspecified, unspecified trimester: Secondary | ICD-10-CM

## 2017-03-20 DIAGNOSIS — R011 Cardiac murmur, unspecified: Secondary | ICD-10-CM | POA: Diagnosis not present

## 2017-03-20 NOTE — Progress Notes (Signed)
Cardiology Office Note    Date:  03/20/2017   ID:  Marie Cabrera, DOB September 14, 1995, MRN [id number]  PCP:  Patient, No Pcp Per  Cardiologist:  New to Dr. Excell Seltzerooper  Chief Complaint: Heart Murmur  History of Present Illness:    Marie Cabrera is a 21 y.o. 7 months pregnant female with hx of asthma, prior hx of polysubstance abuse (cocaine and marijuana),  ADHD and heart murmur as a child refereed by Roe Coombsachelle A Denney, CNM for evaluation of heart murmur.  First pregnancy. 2nd trimester. Recently noted elevated blood pressure. No proteinuria. Noted anemia and started on supplement.   Here today for further evaluation. First diagnosed with heart murmur when she was 21 years old. Noted to routine exam by PCP. Seen by cardiology once. Does not remember details. She was noted palpitation for the past 1-2 months, now improved. Occurs once/week. Resolves in few seconds. Has intermittent dizziness. No prior hx of syncope. Has mild intermittent LE edema.   MGM had MI at age 21. Mother had stroke and MI in her 4530s. No family hx of premature death.   TSH normal 11/12/16.   Past Medical History:  Diagnosis Date  . ADHD (attention deficit hyperactivity disorder)   . Amenorrhea    last menses 2007  . Asthma    last ospitalized >4 years ago.  NO recent problesm  . Heart murmur    [employer] [facility].  MD 2 d Echo 23012  . Rectal bleeding     Past Surgical History:  Procedure Laterality Date  . COLONOSCOPY  03/14/2012   Procedure: COLONOSCOPY;  Surgeon: Jon GillsJoseph H Clark, MD;  [facility];  Service: Gastroenterology;  Laterality: N/A;  . No surgical history      Current Medications: Prior to Admission medications   Medication Sig Start Date End Date Taking? Authorizing Provider  albuterol (PROVENTIL HFA;VENTOLIN HFA) 108 (90 Base) MCG/ACT inhaler Inhale 1 puff into the lungs every 6 (six) hours as needed for wheezing or shortness of breath (seasonal allergies).    [provider]  butalbital-acetaminophen-caffeine (FIORICET, ESGIC) 50-325-40 MG tablet Take 1-2 tablets by mouth every 6 (six) hours as needed. Patient not taking: Reported on 03/07/2017 12/13/16   Orvilla Cornwallenney, Rachelle A, CNM  Doxylamine-Pyridoxine (DICLEGIS) 10-10 MG TBEC Take 1 tablet with breakfast and lunch.  Take 2 tablets at bedtime. 12/13/16   Orvilla Cornwallenney, Rachelle A, CNM  ondansetron (ZOFRAN) 8 MG tablet Take 1 tablet (8 mg total) by mouth every 8 (eight) hours as needed for nausea or vomiting. Patient not taking: Reported on 03/07/2017 12/13/16   Orvilla Cornwallenney, Rachelle A, CNM  Prenat-FeAsp-Meth-FA-DHA w/o A (PRENATE PIXIE) 10-0.6-0.4-200 MG CAPS Take 1 tablet by mouth daily. 02/25/17   Denney, Rachelle A, CNM  Prenatal-DSS-FeCb-FeGl-FA (CITRANATAL BLOOM) 90-1 MG TABS Take 1 tablet by mouth daily. 02/27/17   Roe Coombsenney, Rachelle A, CNM    Allergies:   Patient has no known allergies.   Social History   Social History  . Marital status: Single    Spouse name: N/A  . Number of children: N/A  . Years of education: N/A   Occupational History  . student Minor     10th grade at [employer]   Social History Main Topics  . Smoking status: Never Smoker  . Smokeless tobacco: Never Used  . Alcohol use No  . Drug use: No     Comment: every two months  . Sexual activity: Yes    Partners: Male  Birth control/ protection: None     Comment: per patient   Other Topics Concern  . None   Social History Narrative  . None     Family History:  The patient's family history includes Alcohol abuse in her father, maternal grandmother, and mother; Colon cancer in her maternal grandfather; Colon polyps in her maternal aunt; Crohn's disease in her maternal aunt; Depression in her maternal grandmother and mother; Diabetes in her maternal grandmother; Drug abuse in her father and mother; Kidney disease in her maternal grandmother; Mental illness in her mother and sister. She was adopted.   ROS:   Please see the history of present  illness.    ROS All other systems reviewed and are negative.   PHYSICAL EXAM:   VS:  BP 122/74   Pulse (!) 104   Ht 4\' 9"  (1.448 m)   Wt 135 lb 12 oz (61.6 kg)   LMP 09/08/2016   SpO2 98%   BMI 29.38 kg/m    GEN: Well nourished, well developed pregnant female in no acute distress  HEENT: normal  Neck: no JVD, carotid bruits, or masses Cardiac: RRR; 3/6 systolic murmurs, rubs, or gallops, trace BL LE  edema  Respiratory:  clear to auscultation bilaterally, normal work of breathing GI: soft, nontender, nondistended, + BS MS: no deformity or atrophy  Skin: warm and dry, no rash Neuro:  Alert and Oriented x 3, Strength and sensation are intact Psych: euthymic mood, full affect  Wt Readings from Last 3 Encounters:  03/20/17 135 lb 12 oz (61.6 kg)  03/07/17 133 lb 6 oz (60.5 kg)  03/05/17 130 lb (59 kg)      Studies/Labs Reviewed:   EKG:  EKG is ordered today.  The ekg ordered today demonstrates sinus rhythm at rate of 99 bpm  Recent Labs: 11/12/2016: TSH 1.030 02/25/2017: ALT 52; BUN 8; Creatinine, Ser 0.39; Hemoglobin 10.0; Platelets 234; Potassium 3.9; Sodium 139   Lipid Panel No results found for: CHOL, TRIG, HDL, CHOLHDL, VLDL, LDLCALC, LDLDIRECT  Additional studies/ records that were reviewed today include:   As above  ASSESSMENT & PLAN:    1. Heart murmur - Suspects aortic stenosis vs flow.  No hx of syncope. Seems her palpitation and dizziness (now improved) are likely related to pregnancy. Advised to drink plenty of water. Will get echocardiogram.     Medication Adjustments/Labs and Tests Ordered: Current medicines are reviewed at length with the patient today.  Concerns regarding medicines are outlined above.  Medication changes, Labs and Tests ordered today are listed in the Patient Instructions below. Patient Instructions  Your physician recommends that you continue on your current medications as directed. Please refer to the Current Medication list given  to you today.  Your physician has requested that you have an echocardiogram. Echocardiography is a painless test that uses sound waves to create images of your heart. It provides your doctor with information about the size and shape of your heart and how well your heart's chambers and valves are working. This procedure takes approximately one hour. There are no restrictions for this procedure.    Your physician recommends that you schedule a follow-up appointment in:  AS NEEDED    Lorelei Pont, [place]  03/20/2017 3:04 PM    [facility] [address] [place], [place], [place]  [address] Phone: [phone]; Fax: [phone]   ADDENDUM: Pt seen with Vin Finola Rosal, PA-C. Exam demonstrates a pleasant woman in NAD. Heart RRR With 3/6  systolic murmur at the RUSB. I suspect she has a benign flow murmur accentuated by physiologic changes of pregnancy. Will check an echo to confirm. No other concerning features of her history or exam.   Tonny Bollman 03/20/2017 5:12 PM

## 2017-03-20 NOTE — Patient Instructions (Addendum)
Your physician recommends that you continue on your current medications as directed. Please refer to the Current Medication list given to you today.  Your physician has requested that you have an echocardiogram. Echocardiography is a painless test that uses sound waves to create images of your heart. It provides your doctor with information about the size and shape of your heart and how well your heart's chambers and valves are working. This procedure takes approximately one hour. There are no restrictions for this procedure.    Your physician recommends that you schedule a follow-up appointment in: AS  NEEDED  

## 2017-03-27 ENCOUNTER — Ambulatory Visit (INDEPENDENT_AMBULATORY_CARE_PROVIDER_SITE_OTHER): Payer: Medicaid Other | Admitting: Certified Nurse Midwife

## 2017-03-27 ENCOUNTER — Other Ambulatory Visit: Payer: Medicaid Other

## 2017-03-27 ENCOUNTER — Encounter: Payer: Self-pay | Admitting: Certified Nurse Midwife

## 2017-03-27 VITALS — BP 132/79 | HR 91 | Wt 136.6 lb

## 2017-03-27 DIAGNOSIS — Z3402 Encounter for supervision of normal first pregnancy, second trimester: Secondary | ICD-10-CM

## 2017-03-27 DIAGNOSIS — O99013 Anemia complicating pregnancy, third trimester: Secondary | ICD-10-CM

## 2017-03-27 DIAGNOSIS — O99012 Anemia complicating pregnancy, second trimester: Secondary | ICD-10-CM

## 2017-03-27 DIAGNOSIS — Z3400 Encounter for supervision of normal first pregnancy, unspecified trimester: Secondary | ICD-10-CM

## 2017-03-27 DIAGNOSIS — O98812 Other maternal infectious and parasitic diseases complicating pregnancy, second trimester: Secondary | ICD-10-CM

## 2017-03-27 DIAGNOSIS — O98811 Other maternal infectious and parasitic diseases complicating pregnancy, first trimester: Secondary | ICD-10-CM

## 2017-03-27 DIAGNOSIS — A749 Chlamydial infection, unspecified: Secondary | ICD-10-CM

## 2017-03-27 NOTE — Progress Notes (Signed)
   PRENATAL VISIT NOTE  Subjective:  Marie Cabrera is a 21 y.o. G1P0 at 4542w4d being seen today for ongoing prenatal care.  She is currently monitored for the following issues for this low-risk pregnancy and has Hematochezia; Family history of inflammatory bowel disease; Family history of colonic polyps; Oppositional defiant disorder; Alcohol-induced mood disorder (HCC); Substance abuse; Supervision of normal pregnancy, antepartum; Chlamydia infection affecting pregnancy in first trimester; Mild tetrahydrocannabinol (THC) abuse; Limited prenatal care in second trimester; and Anemia affecting pregnancy in third trimester on her problem list.  Patient reports no complaints.  Contractions: Irritability.  .  Movement: Present. Denies leaking of fluid.   The following portions of the patient's history were reviewed and updated as appropriate: allergies, current medications, past family history, past medical history, past social history, past surgical history and problem list. Problem list updated.  Objective:   Vitals:   03/27/17 0841  BP: 132/79  Pulse: 91  Weight: 136 lb 9.6 oz (62 kg)    Fetal Status: Fetal Heart Rate (bpm): 142 Fundal Height: 29 cm Movement: Present     General:  Alert, oriented and cooperative. Patient is in no acute distress.  Skin: Skin is warm and dry. No rash noted.   Cardiovascular: Normal heart rate noted  Respiratory: Normal respiratory effort, no problems with respiration noted  Abdomen: Soft, gravid, appropriate for gestational age.  Pain/Pressure: Present     Pelvic: Cervical exam deferred        Extremities: Normal range of motion.  Edema: Trace  Mental Status:  Normal mood and affect. Normal behavior. Normal judgment and thought content.   Assessment and Plan:  Pregnancy: G1P0 at 2942w4d  1. Supervision of normal first pregnancy, antepartum      Doing well.  Cardiology consult in process, has echo scheduled for hx of murmur.  - Glucose Tolerance, 2  Hours w/1 Hour - HIV antibody - RPR - CBC  2. Chlamydia infection affecting pregnancy in first trimester     TOC negative  3. Anemia affecting pregnancy in third trimester     Bloom ordered.  Preterm labor symptoms and general obstetric precautions including but not limited to vaginal bleeding, contractions, leaking of fluid and fetal movement were reviewed in detail with the patient. Please refer to After Visit Summary for other counseling recommendations.  Return in about 2 weeks (around 04/10/2017) for ROB.   Marie Cabrera, CNM

## 2017-03-27 NOTE — Progress Notes (Signed)
Patient reports good fetal movement and occasional uterine irritability and pressure.

## 2017-03-28 LAB — CBC
HEMATOCRIT: 32.4 % — AB (ref 34.0–46.6)
HEMOGLOBIN: 10.3 g/dL — AB (ref 11.1–15.9)
MCH: 27.5 pg (ref 26.6–33.0)
MCHC: 31.8 g/dL (ref 31.5–35.7)
MCV: 86 fL (ref 79–97)
Platelets: 223 10*3/uL (ref 150–379)
RBC: 3.75 x10E6/uL — ABNORMAL LOW (ref 3.77–5.28)
RDW: 14.7 % (ref 12.3–15.4)
WBC: 9.2 10*3/uL (ref 3.4–10.8)

## 2017-03-28 LAB — HIV ANTIBODY (ROUTINE TESTING W REFLEX): HIV SCREEN 4TH GENERATION: NONREACTIVE

## 2017-03-28 LAB — GLUCOSE TOLERANCE, 2 HOURS W/ 1HR
GLUCOSE, 1 HOUR: 149 mg/dL (ref 65–179)
GLUCOSE, FASTING: 78 mg/dL (ref 65–91)
Glucose, 2 hour: 156 mg/dL — ABNORMAL HIGH (ref 65–152)

## 2017-03-28 LAB — RPR: RPR Ser Ql: NONREACTIVE

## 2017-03-29 ENCOUNTER — Other Ambulatory Visit: Payer: Self-pay | Admitting: Certified Nurse Midwife

## 2017-03-29 DIAGNOSIS — O24419 Gestational diabetes mellitus in pregnancy, unspecified control: Secondary | ICD-10-CM | POA: Insufficient documentation

## 2017-03-29 DIAGNOSIS — O99013 Anemia complicating pregnancy, third trimester: Secondary | ICD-10-CM

## 2017-03-29 MED ORDER — CITRANATAL BLOOM 90-1 MG PO TABS: 1.0000 | ORAL_TABLET | Freq: Every day | ORAL | 12 refills | Status: DC

## 2017-04-01 ENCOUNTER — Other Ambulatory Visit (HOSPITAL_COMMUNITY): Payer: Medicaid Other

## 2017-04-02 ENCOUNTER — Telehealth: Payer: Self-pay

## 2017-04-02 DIAGNOSIS — O0990 Supervision of high risk pregnancy, unspecified, unspecified trimester: Secondary | ICD-10-CM

## 2017-04-02 MED ORDER — ACCU-CHEK FASTCLIX LANCETS MISC: 1.0000 | Freq: Four times a day (QID) | 12 refills | Status: DC

## 2017-04-02 MED ORDER — ACCU-CHEK GUIDE W/DEVICE KIT: 1.0000 | PACK | Freq: Four times a day (QID) | 0 refills | Status: DC

## 2017-04-02 MED ORDER — GLUCOSE BLOOD VI STRP: ORAL_STRIP | 12 refills | Status: DC

## 2017-04-02 NOTE — Telephone Encounter (Signed)
Attempted to contact, woman answered and stated that she was not there, but she would tell the pt to call us. Was unable to reach her at the other numbers on the chart.

## 2017-04-02 NOTE — Telephone Encounter (Signed)
Glucose supplies sent to pharmacy. 

## 2017-04-03 ENCOUNTER — Telehealth: Payer: Self-pay

## 2017-04-03 NOTE — Telephone Encounter (Signed)
Pt called, advised of results, medications, pt stated that she already has appt for diabetic teaching.

## 2017-04-05 ENCOUNTER — Other Ambulatory Visit (HOSPITAL_COMMUNITY): Payer: Medicaid Other

## 2017-04-10 ENCOUNTER — Ambulatory Visit (INDEPENDENT_AMBULATORY_CARE_PROVIDER_SITE_OTHER): Payer: Medicaid Other | Admitting: Certified Nurse Midwife

## 2017-04-10 VITALS — BP 129/75 | HR 102 | Wt 137.8 lb

## 2017-04-10 DIAGNOSIS — O9989 Other specified diseases and conditions complicating pregnancy, childbirth and the puerperium: Secondary | ICD-10-CM

## 2017-04-10 DIAGNOSIS — O99013 Anemia complicating pregnancy, third trimester: Secondary | ICD-10-CM

## 2017-04-10 DIAGNOSIS — O0993 Supervision of high risk pregnancy, unspecified, third trimester: Secondary | ICD-10-CM

## 2017-04-10 DIAGNOSIS — D649 Anemia, unspecified: Secondary | ICD-10-CM

## 2017-04-10 DIAGNOSIS — O26893 Other specified pregnancy related conditions, third trimester: Secondary | ICD-10-CM

## 2017-04-10 DIAGNOSIS — M549 Dorsalgia, unspecified: Secondary | ICD-10-CM

## 2017-04-10 MED ORDER — COMFORT FIT MATERNITY SUPP LG MISC
1.0000 [IU] | Freq: Every day | 0 refills | Status: DC
Start: 2017-04-10 — End: 2017-06-03

## 2017-04-10 NOTE — Progress Notes (Signed)
Patient reports good fetal movement, complains of constant back pain.

## 2017-04-11 NOTE — Progress Notes (Signed)
   PRENATAL VISIT NOTE  Subjective:  Marie Cabrera is a 21 y.o. G1P0 at 5174w5d being seen today for ongoing prenatal care.  She is currently monitored for the following issues for this high-risk pregnancy and has Hematochezia; Family history of inflammatory bowel disease; Family history of colonic polyps; Oppositional defiant disorder; Alcohol-induced mood disorder (HCC); Substance abuse; Supervision of normal pregnancy, antepartum; Chlamydia infection affecting pregnancy in first trimester; Mild tetrahydrocannabinol (THC) abuse; Limited prenatal care in second trimester; Anemia affecting pregnancy in third trimester; and GDM (gestational diabetes mellitus) on her problem list.  Patient reports backache, no bleeding, no contractions, no cramping and no leaking.  Contractions: Irregular. Vag. Bleeding: None.  Movement: Present. Denies leaking of fluid.   The following portions of the patient's history were reviewed and updated as appropriate: allergies, current medications, past family history, past medical history, past social history, past surgical history and problem list. Problem list updated.  Objective:   Vitals:   04/10/17 1556  BP: 129/75  Pulse: (!) 102  Weight: 137 lb 12.8 oz (62.5 kg)    Fetal Status: Fetal Heart Rate (bpm): 151 Fundal Height: 30 cm Movement: Present     General:  Alert, oriented and cooperative. Patient is in no acute distress.  Skin: Skin is warm and dry. No rash noted.   Cardiovascular: Normal heart rate noted  Respiratory: Normal respiratory effort, no problems with respiration noted  Abdomen: Soft, gravid, appropriate for gestational age.  Pain/Pressure: Present     Pelvic: Cervical exam deferred        Extremities: Normal range of motion.  Edema: Trace  Mental Status:  Normal mood and affect. Normal behavior. Normal judgment and thought content.   Assessment and Plan:  Pregnancy: G1P0 at 2474w5d  1. Anemia affecting pregnancy in third trimester  Taking Bloom  2. Supervision of high risk first pregnancy, antepartum       D/T GDM  3. Back pain complicating pregnancy in third trimester     - Elastic Bandages & Supports (COMFORT FIT MATERNITY SUPP LG) MISC; 1 Units by Does not apply route daily.  Dispense: 1 each; Refill: 0  Preterm labor symptoms and general obstetric precautions including but not limited to vaginal bleeding, contractions, leaking of fluid and fetal movement were reviewed in detail with the patient. Please refer to After Visit Summary for other counseling recommendations.  Return in about 2 weeks (around 04/24/2017) [facility], Needs to see FP MD here.   Roe Coombsachelle A Kelie Gainey, CNM

## 2017-04-15 ENCOUNTER — Encounter (HOSPITAL_COMMUNITY): Payer: Self-pay | Admitting: *Deleted

## 2017-04-15 ENCOUNTER — Inpatient Hospital Stay (HOSPITAL_COMMUNITY)
Admission: AD | Admit: 2017-04-15 | Discharge: 2017-04-15 | Disposition: A | Discharge status: Home / Self Care | Payer: Medicaid Other | Source: Ambulatory Visit | Attending: Obstetrics & Gynecology | Admitting: Obstetrics & Gynecology

## 2017-04-15 DIAGNOSIS — O0993 Supervision of high risk pregnancy, unspecified, third trimester: Secondary | ICD-10-CM

## 2017-04-15 DIAGNOSIS — O26893 Other specified pregnancy related conditions, third trimester: Secondary | ICD-10-CM | POA: Diagnosis not present

## 2017-04-15 DIAGNOSIS — R109 Unspecified abdominal pain: Secondary | ICD-10-CM | POA: Diagnosis not present

## 2017-04-15 DIAGNOSIS — Z3A31 31 weeks gestation of pregnancy: Secondary | ICD-10-CM | POA: Diagnosis not present

## 2017-04-15 LAB — URINALYSIS, ROUTINE W REFLEX MICROSCOPIC
Bilirubin Urine: NEGATIVE
GLUCOSE, UA: NEGATIVE mg/dL
HGB URINE DIPSTICK: NEGATIVE
Ketones, ur: NEGATIVE mg/dL
Nitrite: NEGATIVE
PH: 6 (ref 5.0–8.0)
PROTEIN: NEGATIVE mg/dL
Specific Gravity, Urine: 1.017 (ref 1.005–1.030)

## 2017-04-15 LAB — WET PREP, GENITAL
CLUE CELLS WET PREP: NONE SEEN
Sperm: NONE SEEN
Trich, Wet Prep: NONE SEEN
Yeast Wet Prep HPF POC: NONE SEEN

## 2017-04-15 MED ORDER — ACETAMINOPHEN 500 MG PO TABS
1000.0000 mg | ORAL_TABLET | Freq: Once | ORAL | Status: AC
Start: 2017-04-15 — End: 2017-04-15
  Administered 2017-04-15: 1000 mg via ORAL
  Filled 2017-04-15: qty 2

## 2017-04-15 NOTE — Discharge Instructions (Signed)

## 2017-04-15 NOTE — MAU Note (Signed)
Pt states that the sharp mid abd pain and low back that persisted all day and evening has subsided completely.  She had a short period of sharp epigastric pain approx 1 hour ago which is somewhat better now. She reports having 2 hot dogsfor dinner and states having drank at least 6 tall glasses of water without ice today. Reports nausea tonight but denies any vomiting or diarrhea.

## 2017-04-15 NOTE — MAU Provider Note (Signed)
History     CSN: [id number]  Arrival date and time: 04/15/17 2115   First Provider Initiated Contact with Patient 04/15/17 2250      Chief Complaint  Patient presents with  . Contractions   HPI  [name] is a 21 yo G1P0 at 31.[redacted] wks gestation presenting to MAU with complaints of intermittent abdominal pain since earlier today.  She is unsure if they are UC's.  She reports drinking about 6 tall glasses of water today.  She denies VB or LOF.  She also complains of a mild H/A.  She report good (+) FM all day today.  Past Medical History:  Diagnosis Date  . ADHD (attention deficit hyperactivity disorder)   . Amenorrhea    last menses 2007  . Asthma    last ospitalized >4 years ago.  NO recent problesm  . Heart murmur    [facility].  MD 2 d Echo 23012  . Rectal bleeding     Past Surgical History:  Procedure Laterality Date  . COLONOSCOPY  03/14/2012   Procedure: COLONOSCOPY;  Surgeon: Jon GillsJoseph H Clark, MD;  [facility];  Service: Gastroenterology;  Laterality: N/A;  . No surgical history      Family History  Problem Relation Age of Onset  . Adopted: Yes  . Crohn's disease Maternal Aunt   . Colon polyps Maternal Aunt   . Colon cancer Maternal Grandfather   . Alcohol abuse Mother   . Depression Mother   . Drug abuse Mother   . Mental illness Mother   . Alcohol abuse Father   . Drug abuse Father   . Mental illness Sister   . Alcohol abuse Maternal Grandmother   . Depression Maternal Grandmother   . Diabetes Maternal Grandmother   . Kidney disease Maternal Grandmother   . Other Unknown        Pt is adopted, only knows about maternal family    Social History  Substance Use Topics  . Smoking status: Never Smoker  . Smokeless tobacco: Never Used  . Alcohol use No    Allergies: No Known Allergies  No prescriptions prior to admission.    Review of Systems  Constitutional: Negative.   HENT: Negative.   Eyes: Negative.   Respiratory:  Negative.   Cardiovascular: Negative.   Gastrointestinal: Positive for abdominal pain (intermittent all day, radiates to lower back).  Endocrine: Negative.   Genitourinary: Positive for pelvic pain. Negative for vaginal bleeding.  Musculoskeletal: Positive for back pain.  Skin: Negative.   Allergic/Immunologic: Negative.   Neurological: Negative.   Psychiatric/Behavioral: Negative.    Physical Exam   Blood pressure 123/71, pulse 91, temperature 98.2 F (36.8 C), temperature source Oral, resp. rate 16, height 4\' 11"  (1.499 m), weight 63.5 kg (140 lb), last menstrual period 09/08/2016, SpO2 99 %.  Physical Exam  Constitutional: She is oriented to person, place, and time. She appears well-developed and well-nourished.  HENT:  Head: Normocephalic.  Eyes: Pupils are equal, round, and reactive to light.  Neck: Normal range of motion.  Cardiovascular: Normal rate, regular rhythm and normal heart sounds.   Respiratory: Effort normal and breath sounds normal.  GI: Soft. Bowel sounds are normal.  Genitourinary:  Genitourinary Comments: Uterus: gravid, S=D, cx; smooth, pink, no lesions, small amt of thick, clumpy, white d/c, closed/long/firm, no CMT or friability, no adnexal tenderness  Musculoskeletal: Normal range of motion.  Neurological: She is alert and oriented to person, place, and time.  Skin: Skin  is warm and dry.  Psychiatric: She has a normal mood and affect. Her behavior is normal. Judgment and thought content normal.    MAU Course  Procedures  MDM CCUA NST - FHR: 145 bpm / moderate variability / accels present / decels absent TOCO: UI with occ. UC's Wet Prep GC/CT  Results for orders placed or performed during the hospital encounter of 04/15/17 (from the past 24 hour(s))  Urinalysis, Routine w reflex microscopic     Status: Abnormal   Collection Time: 04/15/17  9:30 PM  Result Value Ref Range   Color, Urine YELLOW YELLOW   APPearance HAZY (A) CLEAR   Specific  Gravity, Urine 1.017 1.005 - 1.030   pH 6.0 5.0 - 8.0   Glucose, UA NEGATIVE NEGATIVE mg/dL   Hgb urine dipstick NEGATIVE NEGATIVE   Bilirubin Urine NEGATIVE NEGATIVE   Ketones, ur NEGATIVE NEGATIVE mg/dL   Protein, ur NEGATIVE NEGATIVE mg/dL   Nitrite NEGATIVE NEGATIVE   Leukocytes, UA SMALL (A) NEGATIVE   RBC / HPF 0-5 0 - 5 RBC/hpf   WBC, UA 0-5 0 - 5 WBC/hpf   Bacteria, UA RARE (A) NONE SEEN   Squamous Epithelial / LPF 0-5 (A) NONE SEEN   Mucous PRESENT   Wet prep, genital     Status: Abnormal   Collection Time: 04/15/17 11:05 PM  Result Value Ref Range   Yeast Wet Prep HPF POC NONE SEEN NONE SEEN   Trich, Wet Prep NONE SEEN NONE SEEN   Clue Cells Wet Prep HPF POC NONE SEEN NONE SEEN   WBC, Wet Prep HPF POC MODERATE (A) NONE SEEN   Sperm NONE SEEN    * Patient notified of WP results via TC @ 2338.  Assessment and Plan  Abdominal pain during pregnancy in third trimester - Advised to stay well-hydrated daily, rest as much as possible - Keep scheduled appt with [facility]  Discharge home Patient verbalized an understanding of the plan of care and agrees.   Marie Mora, MSN, CNM 04/15/2017, 11:01 PM

## 2017-04-15 NOTE — MAU Note (Signed)
Pt reports abdominal pain that started earlier today. States the pain is intermittent. Unsure if it is contractions. Radiates into back. Pt denies vaginal bleeding or vaginal discharge. Pt reports good fetal movement.

## 2017-04-16 LAB — GC/CHLAMYDIA PROBE AMP (~~LOC~~) NOT AT ARMC
CHLAMYDIA, DNA PROBE: NEGATIVE
NEISSERIA GONORRHEA: NEGATIVE

## 2017-04-17 ENCOUNTER — Ambulatory Visit: Payer: Medicaid Other | Admitting: Registered"

## 2017-04-18 ENCOUNTER — Other Ambulatory Visit (HOSPITAL_COMMUNITY): Payer: Self-pay | Admitting: Maternal and Fetal Medicine

## 2017-04-18 ENCOUNTER — Ambulatory Visit (HOSPITAL_COMMUNITY)
Admission: RE | Admit: 2017-04-18 | Discharge: 2017-04-18 | Disposition: A | Discharge status: Home / Self Care | Payer: Medicaid Other | Source: Ambulatory Visit | Attending: Obstetrics | Admitting: Obstetrics

## 2017-04-18 ENCOUNTER — Encounter (HOSPITAL_COMMUNITY): Payer: Self-pay

## 2017-04-18 DIAGNOSIS — R109 Unspecified abdominal pain: Secondary | ICD-10-CM

## 2017-04-18 DIAGNOSIS — Z3A31 31 weeks gestation of pregnancy: Secondary | ICD-10-CM

## 2017-04-18 DIAGNOSIS — Z0489 Encounter for examination and observation for other specified reasons: Secondary | ICD-10-CM

## 2017-04-18 DIAGNOSIS — IMO0002 Reserved for concepts with insufficient information to code with codable children: Secondary | ICD-10-CM

## 2017-04-18 DIAGNOSIS — O133 Gestational [pregnancy-induced] hypertension without significant proteinuria, third trimester: Secondary | ICD-10-CM | POA: Diagnosis not present

## 2017-04-18 DIAGNOSIS — O99323 Drug use complicating pregnancy, third trimester: Secondary | ICD-10-CM

## 2017-04-18 DIAGNOSIS — O24410 Gestational diabetes mellitus in pregnancy, diet controlled: Secondary | ICD-10-CM

## 2017-04-18 DIAGNOSIS — Z362 Encounter for other antenatal screening follow-up: Secondary | ICD-10-CM | POA: Diagnosis not present

## 2017-04-18 DIAGNOSIS — O26893 Other specified pregnancy related conditions, third trimester: Secondary | ICD-10-CM

## 2017-04-18 DIAGNOSIS — O0993 Supervision of high risk pregnancy, unspecified, third trimester: Secondary | ICD-10-CM

## 2017-04-25 ENCOUNTER — Ambulatory Visit (INDEPENDENT_AMBULATORY_CARE_PROVIDER_SITE_OTHER): Payer: Medicaid Other | Admitting: Obstetrics and Gynecology

## 2017-04-25 VITALS — BP 138/79 | HR 107 | Wt 140.0 lb

## 2017-04-25 DIAGNOSIS — O0932 Supervision of pregnancy with insufficient antenatal care, second trimester: Secondary | ICD-10-CM

## 2017-04-25 DIAGNOSIS — O24419 Gestational diabetes mellitus in pregnancy, unspecified control: Secondary | ICD-10-CM

## 2017-04-25 DIAGNOSIS — O0993 Supervision of high risk pregnancy, unspecified, third trimester: Secondary | ICD-10-CM

## 2017-04-25 NOTE — Progress Notes (Signed)
Patient rescheduled Diabetic Teaching  Appointment to 05/01/17 @4pm 

## 2017-04-25 NOTE — Progress Notes (Signed)
   PRENATAL VISIT NOTE  Subjective:  Marie Cabrera is a 21 y.o. G1P0 at 5952w5d being seen today for ongoing prenatal care.  She is currently monitored for the following issues for this high-risk pregnancy and has Hematochezia; Oppositional defiant disorder; Alcohol-induced mood disorder (HCC); Substance abuse; Supervision of high risk pregnancy, antepartum, third trimester; Chlamydia infection affecting pregnancy in first trimester; Mild tetrahydrocannabinol (THC) abuse; Limited prenatal care in second trimester; Anemia affecting pregnancy in third trimester; GDM (gestational diabetes mellitus); and Abdominal pain during pregnancy in third trimester on her problem list.  Patient reports no complaints.  Contractions: Irritability. Vag. Bleeding: None.  Movement: Present. Denies leaking of fluid.   The following portions of the patient's history were reviewed and updated as appropriate: allergies, current medications, past family history, past medical history, past social history, past surgical history and problem list. Problem list updated.  Objective:   Vitals:   04/25/17 1356  BP: 138/79  Pulse: (!) 107  Weight: 140 lb (63.5 kg)    Fetal Status: Fetal Heart Rate (bpm): 154 (Simultaneous filing. User may not have seen previous data.)   Movement: Present     General:  Alert, oriented and cooperative. Patient is in no acute distress.  Skin: Skin is warm and dry. No rash noted.   Cardiovascular: Normal heart rate noted  Respiratory: Normal respiratory effort, no problems with respiration noted  Abdomen: Soft, gravid, appropriate for gestational age.  Pain/Pressure: Absent     Pelvic: Cervical exam deferred        Extremities: Normal range of motion.  Edema: Trace  Mental Status:  Normal mood and affect. Normal behavior. Normal judgment and thought content.   Assessment and Plan:  Pregnancy: G1P0 at 2152w5d  1. Supervision of high risk pregnancy, antepartum, third trimester Patient is  doing well Maternity support belt helping with the back pain Patient is considering IUD or Nexplanon for contraception  2. Gestational diabetes mellitus (GDM) in third trimester, gestational diabetes method of control unspecified Patient scheduled to meet educator next week She has been testing and majority of CBGs within range  3. Limited prenatal care in second trimester   Preterm labor symptoms and general obstetric precautions including but not limited to vaginal bleeding, contractions, leaking of fluid and fetal movement were reviewed in detail with the patient. Please refer to After Visit Summary for other counseling recommendations.  Return in about 2 weeks (around 05/09/2017) for [facility].   Catalina AntiguaPeggy Flora Parks, MD

## 2017-05-01 ENCOUNTER — Ambulatory Visit: Payer: Medicaid Other | Admitting: Registered"

## 2017-05-01 ENCOUNTER — Encounter: Payer: Self-pay | Admitting: *Deleted

## 2017-05-06 ENCOUNTER — Encounter: Payer: Medicaid Other | Admitting: Obstetrics and Gynecology

## 2017-05-06 ENCOUNTER — Telehealth (HOSPITAL_COMMUNITY): Payer: Self-pay | Admitting: Physician Assistant

## 2017-05-06 NOTE — Telephone Encounter (Signed)
04/09/17 LMOM for patient to CB to schedule echo EVD  04/29/17 Called and spoke with pt's mother and she voiced that she would have the patient to call back to r/s .Marland KitchenRG  05/03/17 LMOM evd  05/06/17 Called pt and lmsg for her to CB.Marland KitchenRG  Pt has cx appts on 04/01/17 and no-showed on 04/05/17.  She will be removed from the workqueue.

## 2017-05-21 ENCOUNTER — Encounter: Payer: Self-pay | Admitting: Obstetrics and Gynecology

## 2017-05-21 ENCOUNTER — Ambulatory Visit (INDEPENDENT_AMBULATORY_CARE_PROVIDER_SITE_OTHER): Payer: Medicaid Other | Admitting: Obstetrics and Gynecology

## 2017-05-21 ENCOUNTER — Other Ambulatory Visit (HOSPITAL_COMMUNITY)
Admission: RE | Admit: 2017-05-21 | Discharge: 2017-05-21 | Disposition: A | Discharge status: Home / Self Care | Payer: Medicaid Other | Source: Ambulatory Visit | Attending: Obstetrics and Gynecology | Admitting: Obstetrics and Gynecology

## 2017-05-21 VITALS — BP 139/79 | HR 84 | Wt 143.3 lb

## 2017-05-21 DIAGNOSIS — O0993 Supervision of high risk pregnancy, unspecified, third trimester: Secondary | ICD-10-CM | POA: Diagnosis not present

## 2017-05-21 DIAGNOSIS — Z3A36 36 weeks gestation of pregnancy: Secondary | ICD-10-CM | POA: Diagnosis not present

## 2017-05-21 DIAGNOSIS — O24410 Gestational diabetes mellitus in pregnancy, diet controlled: Secondary | ICD-10-CM

## 2017-05-21 NOTE — Addendum Note (Signed)
Addended by: Dalphine HandingGARDNER, Lurine Imel L on: 05/21/2017 04:35 PM   Modules accepted: Orders

## 2017-05-21 NOTE — Patient Instructions (Signed)
Vaginal Delivery Vaginal delivery means that you will give birth by pushing your baby out of your birth canal (vagina). A team of health care providers will help you before, during, and after vaginal delivery. Birth experiences are unique for every woman and every pregnancy, and birth experiences vary depending on where you choose to give birth. What should I do to prepare for my baby's birth? Before your baby is born, it is important to talk with your health care provider about:  Your labor and delivery preferences. These may include: ? Medicines that you may be given. ? How you will manage your pain. This might include non-medical pain relief techniques or injectable pain relief such as epidural analgesia. ? How you and your baby will be monitored during labor and delivery. ? Who may be in the labor and delivery room with you. ? Your feelings about surgical delivery of your baby (cesarean delivery, or C-section) if this becomes necessary. ? Your feelings about receiving donated blood through an IV tube (blood transfusion) if this becomes necessary.  Whether you are able: ? To take pictures or videos of the birth. ? To eat during labor and delivery. ? To move around, walk, or change positions during labor and delivery.  What to expect after your baby is born, such as: ? Whether delayed umbilical cord clamping and cutting is offered. ? Who will care for your baby right after birth. ? Medicines or tests that may be recommended for your baby. ? Whether breastfeeding is supported in your hospital or birth center. ? How long you will be in the hospital or birth center.  How any medical conditions you have may affect your baby or your labor and delivery experience.  To prepare for your baby's birth, you should also:  Attend all of your health care visits before delivery (prenatal visits) as recommended by your health care provider. This is important.  Prepare your home for your baby's  arrival. Make sure that you have: ? Diapers. ? Baby clothing. ? Feeding equipment. ? Safe sleeping arrangements for you and your baby.  Install a car seat in your vehicle. Have your car seat checked by a certified car seat installer to make sure that it is installed safely.  Think about who will help you with your new baby at home for at least the first several weeks after delivery.  What can I expect when I arrive at the birth center or hospital? Once you are in labor and have been admitted into the hospital or birth center, your health care provider may:  Review your pregnancy history and any concerns you have.  Insert an IV tube into one of your veins. This is used to give you fluids and medicines.  Check your blood pressure, pulse, temperature, and heart rate (vital signs).  Check whether your bag of water (amniotic sac) has broken (ruptured).  Talk with you about your birth plan and discuss pain control options.  Monitoring Your health care provider may monitor your contractions (uterine monitoring) and your baby's heart rate (fetal monitoring). You may need to be monitored:  Often, but not continuously (intermittently).  All the time or for long periods at a time (continuously). Continuous monitoring may be needed if: ? You are taking certain medicines, such as medicine to relieve pain or make your contractions stronger. ? You have pregnancy or labor complications.  Monitoring may be done by:  Placing a special stethoscope or a handheld monitoring device on your abdomen to   check your baby's heartbeat, and feeling your abdomen for contractions. This method of monitoring does not continuously record your baby's heartbeat or your contractions.  Placing monitors on your abdomen (external monitors) to record your baby's heartbeat and the frequency and length of contractions. You may not have to wear external monitors all the time.  Placing monitors inside of your uterus  (internal monitors) to record your baby's heartbeat and the frequency, length, and strength of your contractions. ? Your health care provider may use internal monitors if he or she needs more information about the strength of your contractions or your baby's heart rate. ? Internal monitors are put in place by passing a thin, flexible wire through your vagina and into your uterus. Depending on the type of monitor, it may remain in your uterus or on your baby's head until birth. ? Your health care provider will discuss the benefits and risks of internal monitoring with you and will ask for your permission before inserting the monitors.  Telemetry. This is a type of continuous monitoring that can be done with external or internal monitors. Instead of having to stay in bed, you are able to move around during telemetry. Ask your health care provider if telemetry is an option for you.  Physical exam Your health care provider may perform a physical exam. This may include:  Checking whether your baby is positioned: ? With the head toward your vagina (head-down). This is most common. ? With the head toward the top of your uterus (head-up or breech). If your baby is in a breech position, your health care provider may try to turn your baby to a head-down position so you can deliver vaginally. If it does not seem that your baby can be born vaginally, your provider may recommend surgery to deliver your baby. In rare cases, you may be able to deliver vaginally if your baby is head-up (breech delivery). ? Lying sideways (transverse). Babies that are lying sideways cannot be delivered vaginally.  Checking your cervix to determine: ? Whether it is thinning out (effacing). ? Whether it is opening up (dilating). ? How low your baby has moved into your birth canal.  What are the three stages of labor and delivery?  Normal labor and delivery is divided into the following three stages: Stage 1  Stage 1 is the  longest stage of labor, and it can last for hours or days. Stage 1 includes: ? Early labor. This is when contractions may be irregular, or regular and mild. Generally, early labor contractions are more than 10 minutes apart. ? Active labor. This is when contractions get longer, more regular, more frequent, and more intense. ? The transition phase. This is when contractions happen very close together, are very intense, and may last longer than during any other part of labor.  Contractions generally feel mild, infrequent, and irregular at first. They get stronger, more frequent (about every 2-3 minutes), and more regular as you progress from early labor through active labor and transition.  Many women progress through stage 1 naturally, but you may need help to continue making progress. If this happens, your health care provider may talk with you about: ? Rupturing your amniotic sac if it has not ruptured yet. ? Giving you medicine to help make your contractions stronger and more frequent.  Stage 1 ends when your cervix is completely dilated to 4 inches (10 cm) and completely effaced. This happens at the end of the transition phase. Stage 2  Once   your cervix is completely effaced and dilated to 4 inches (10 cm), you may start to feel an urge to push. It is common for the body to naturally take a rest before feeling the urge to push, especially if you received an epidural or certain other pain medicines. This rest period may last for up to 1-2 hours, depending on your unique labor experience.  During stage 2, contractions are generally less painful, because pushing helps relieve contraction pain. Instead of contraction pain, you may feel stretching and burning pain, especially when the widest part of your baby's head passes through the vaginal opening (crowning).  Your health care provider will closely monitor your pushing progress and your baby's progress through the vagina during stage 2.  Your  health care provider may massage the area of skin between your vaginal opening and anus (perineum) or apply warm compresses to your perineum. This helps it stretch as the baby's head starts to crown, which can help prevent perineal tearing. ? In some cases, an incision may be made in your perineum (episiotomy) to allow the baby to pass through the vaginal opening. An episiotomy helps to make the opening of the vagina larger to allow more room for the baby to fit through.  It is very important to breathe and focus so your health care provider can control the delivery of your baby's head. Your health care provider may have you decrease the intensity of your pushing, to help prevent perineal tearing.  After delivery of your baby's head, the shoulders and the rest of the body generally deliver very quickly and without difficulty.  Once your baby is delivered, the umbilical cord may be cut right away, or this may be delayed for 1-2 minutes, depending on your baby's health. This may vary among health care providers, hospitals, and birth centers.  If you and your baby are healthy enough, your baby may be placed on your chest or abdomen to help maintain the baby's temperature and to help you bond with each other. Some mothers and babies start breastfeeding at this time. Your health care team will dry your baby and help keep your baby warm during this time.  Your baby may need immediate care if he or she: ? Showed signs of distress during labor. ? Has a medical condition. ? Was born too early (prematurely). ? Had a bowel movement before birth (meconium). ? Shows signs of difficulty transitioning from being inside the uterus to being outside of the uterus. If you are planning to breastfeed, your health care team will help you begin a feeding. Stage 3  The third stage of labor starts immediately after the birth of your baby and ends after you deliver the placenta. The placenta is an organ that develops  during pregnancy to provide oxygen and nutrients to your baby in the womb.  Delivering the placenta may require some pushing, and you may have mild contractions. Breastfeeding can stimulate contractions to help you deliver the placenta.  After the placenta is delivered, your uterus should tighten (contract) and become firm. This helps to stop bleeding in your uterus. To help your uterus contract and to control bleeding, your health care provider may: ? Give you medicine by injection, through an IV tube, by mouth, or through your rectum (rectally). ? Massage your abdomen or perform a vaginal exam to remove any blood clots that are left in your uterus. ? Empty your bladder by placing a thin, flexible tube (catheter) into your bladder. ? Encourage   you to breastfeed your baby. After labor is over, you and your baby will be monitored closely to ensure that you are both healthy until you are ready to go home. Your health care team will teach you how to care for yourself and your baby. This information is not intended to replace advice given to you by your health care provider. Make sure you discuss any questions you have with your health care provider. Document Released: 06/05/2008 Document Revised: 03/16/2016 Document Reviewed: 09/11/2015 [employer] Interactive Patient Education  2018 [employer]  

## 2017-05-21 NOTE — Progress Notes (Signed)
Subjective:  Marie Cabrera is a 21 y.o. G1P0 at 1268w3d being seen today for ongoing prenatal care.  She is currently monitored for the following issues for this high-risk pregnancy and has Hematochezia; Oppositional defiant disorder; Alcohol-induced mood disorder (HCC); Substance abuse; Supervision of high risk pregnancy, antepartum, third trimester; Mild tetrahydrocannabinol (THC) abuse; Limited prenatal care in second trimester; Anemia affecting pregnancy in third trimester; and GDM (gestational diabetes mellitus) on her problem list.  Patient reports no complaints.  Contractions: Irritability. Vag. Bleeding: None.  Movement: Present. Denies leaking of fluid.   The following portions of the patient's history were reviewed and updated as appropriate: allergies, current medications, past family history, past medical history, past social history, past surgical history and problem list. Problem list updated.  Objective:   Vitals:   05/21/17 1524  BP: 139/79  Pulse: 84  Weight: 143 lb 4.8 oz (65 kg)    Fetal Status: Fetal Heart Rate (bpm): 141   Movement: Present     General:  Alert, oriented and cooperative. Patient is in no acute distress.  Skin: Skin is warm and dry. No rash noted.   Cardiovascular: Normal heart rate noted  Respiratory: Normal respiratory effort, no problems with respiration noted  Abdomen: Soft, gravid, appropriate for gestational age. Pain/Pressure: Present     Pelvic:  Cervical exam performed        Extremities: Normal range of motion.  Edema: None  Mental Status: Normal mood and affect. Normal behavior. Normal judgment and thought content.   Urinalysis:      Assessment and Plan:  Pregnancy: G1P0 at 3368w3d  1. Supervision of high risk pregnancy, antepartum, third trimester Labor precautions - Strep Gp B NAA  2. Diet controlled gestational diabetes mellitus (GDM) in third trimester Did not bring BS readings but reports in goal range Importance of bringing BS  readings to all OV discussed U/S 8/9 59 % growth  Term labor symptoms and general obstetric precautions including but not limited to vaginal bleeding, contractions, leaking of fluid and fetal movement were reviewed in detail with the patient. Please refer to After Visit Summary for other counseling recommendations.  Return in about 1 week (around 05/28/2017) for OB visit.   Hermina StaggersErvin, Reagyn Facemire L, MD

## 2017-05-22 ENCOUNTER — Inpatient Hospital Stay (HOSPITAL_COMMUNITY)
Admission: AD | Admit: 2017-05-22 | Discharge: 2017-05-22 | Disposition: A | Discharge status: Home / Self Care | Payer: Medicaid Other | Source: Ambulatory Visit | Attending: Obstetrics and Gynecology | Admitting: Obstetrics and Gynecology

## 2017-05-22 ENCOUNTER — Encounter (HOSPITAL_COMMUNITY): Payer: Self-pay | Admitting: *Deleted

## 2017-05-22 DIAGNOSIS — Z3A36 36 weeks gestation of pregnancy: Secondary | ICD-10-CM | POA: Insufficient documentation

## 2017-05-22 DIAGNOSIS — R030 Elevated blood-pressure reading, without diagnosis of hypertension: Secondary | ICD-10-CM | POA: Diagnosis not present

## 2017-05-22 DIAGNOSIS — O26893 Other specified pregnancy related conditions, third trimester: Secondary | ICD-10-CM | POA: Insufficient documentation

## 2017-05-22 DIAGNOSIS — O479 False labor, unspecified: Secondary | ICD-10-CM

## 2017-05-22 DIAGNOSIS — O163 Unspecified maternal hypertension, third trimester: Secondary | ICD-10-CM

## 2017-05-22 DIAGNOSIS — O4703 False labor before 37 completed weeks of gestation, third trimester: Secondary | ICD-10-CM | POA: Diagnosis not present

## 2017-05-22 DIAGNOSIS — R102 Pelvic and perineal pain: Secondary | ICD-10-CM | POA: Diagnosis present

## 2017-05-22 LAB — COMPREHENSIVE METABOLIC PANEL
ALBUMIN: 3.1 g/dL — AB (ref 3.5–5.0)
ALT: 17 U/L (ref 14–54)
AST: 22 U/L (ref 15–41)
Alkaline Phosphatase: 165 U/L — ABNORMAL HIGH (ref 38–126)
Anion gap: 8 (ref 5–15)
BILIRUBIN TOTAL: 0.5 mg/dL (ref 0.3–1.2)
BUN: 8 mg/dL (ref 6–20)
CHLORIDE: 107 mmol/L (ref 101–111)
CO2: 24 mmol/L (ref 22–32)
Calcium: 8.6 mg/dL — ABNORMAL LOW (ref 8.9–10.3)
Creatinine, Ser: 0.65 mg/dL (ref 0.44–1.00)
GFR calc Af Amer: >60 mL/min (ref >60)
GFR calc non Af Amer: >60 mL/min (ref >60)
GLUCOSE: 85 mg/dL (ref 65–99)
POTASSIUM: 3.6 mmol/L (ref 3.5–5.1)
Sodium: 139 mmol/L (ref 135–145)
Total Protein: 6.4 g/dL — ABNORMAL LOW (ref 6.5–8.1)

## 2017-05-22 LAB — PROTEIN / CREATININE RATIO, URINE
Creatinine, Urine: 69 mg/dL
Total Protein, Urine: <6 mg/dL

## 2017-05-22 LAB — URIC ACID: Uric Acid, Serum: 4.1 mg/dL (ref 2.3–6.6)

## 2017-05-22 LAB — CERVICOVAGINAL ANCILLARY ONLY
Chlamydia: NEGATIVE
NEISSERIA GONORRHEA: NEGATIVE

## 2017-05-22 LAB — URINALYSIS, ROUTINE W REFLEX MICROSCOPIC
Bilirubin Urine: NEGATIVE
GLUCOSE, UA: NEGATIVE mg/dL
Hgb urine dipstick: NEGATIVE
Ketones, ur: NEGATIVE mg/dL
LEUKOCYTES UA: NEGATIVE
Nitrite: NEGATIVE
PH: 7 (ref 5.0–8.0)
PROTEIN: NEGATIVE mg/dL
SPECIFIC GRAVITY, URINE: 1.009 (ref 1.005–1.030)

## 2017-05-22 LAB — CBC
HEMATOCRIT: 29.8 % — AB (ref 36.0–46.0)
Hemoglobin: 10.1 g/dL — ABNORMAL LOW (ref 12.0–15.0)
MCH: 27.7 pg (ref 26.0–34.0)
MCHC: 33.9 g/dL (ref 30.0–36.0)
MCV: 81.9 fL (ref 78.0–100.0)
PLATELETS: 206 10*3/uL (ref 150–400)
RBC: 3.64 MIL/uL — AB (ref 3.87–5.11)
RDW: 14.8 % (ref 11.5–15.5)
WBC: 7.4 10*3/uL (ref 4.0–10.5)

## 2017-05-22 MED ORDER — LABETALOL HCL 5 MG/ML IV SOLN: 20.0000 mg | INTRAVENOUS | Status: DC | PRN

## 2017-05-22 MED ORDER — HYDRALAZINE HCL 20 MG/ML IJ SOLN: 10.0000 mg | Freq: Once | INTRAMUSCULAR | Status: DC | PRN

## 2017-05-22 NOTE — Discharge Instructions (Signed)

## 2017-05-22 NOTE — MAU Note (Signed)
Pt reports pressure and contractions

## 2017-05-22 NOTE — MAU Provider Note (Signed)
History     CSN: [id number]  Arrival date and time: 05/22/17 1758     Chief Complaint  Patient presents with  . Contractions   HPI :  Patient is a 21 year old G2P0 at 36 weeks 4 days who presents for contractions.  She reports contractions began earlier today.  She denies loss of fluid, vaginal bleeding.  She endorses adequate fetal movement.  She is noted to have mildly elevated blood pressure with systolic 300P and diastolic 23R.  She denies headache, vision changes, peripheral edema, RUQ or epigastric pain.  She has had no prior elevated documented blood pressures.    OB History    Gravida Para Term Preterm AB Living   2         0   SAB TAB Ectopic Multiple Live Births                  Past Medical History:  Diagnosis Date  . ADHD (attention deficit hyperactivity disorder)   . Amenorrhea    last menses 2007  . Asthma    last ospitalized >4 years ago.  NO recent problesm  . Heart murmur    [facility].  MD 2 d Echo 23012  . Rectal bleeding     Past Surgical History:  Procedure Laterality Date  . COLONOSCOPY  03/14/2012   Procedure: COLONOSCOPY;  Surgeon: Oletha Blend, MD;  Location: [place];  Service: Gastroenterology;  Laterality: N/A;  . No surgical history      Family History  Problem Relation Age of Onset  . Adopted: Yes  . Crohn's disease Maternal Aunt   . Colon polyps Maternal Aunt   . Colon cancer Maternal Grandfather   . Alcohol abuse Mother   . Depression Mother   . Drug abuse Mother   . Mental illness Mother   . Alcohol abuse Father   . Drug abuse Father   . Mental illness Sister   . Alcohol abuse Maternal Grandmother   . Depression Maternal Grandmother   . Diabetes Maternal Grandmother   . Kidney disease Maternal Grandmother   . Other Unknown        Pt is adopted, only knows about maternal family    Social History  Substance Use Topics  . Smoking status: Never Smoker  . Smokeless tobacco: Never Used  . Alcohol use No     Allergies: No Known Allergies  Prescriptions Prior to Admission  Medication Sig Dispense Refill Last Dose  . ACCU-CHEK FASTCLIX LANCETS MISC 1 Device by Percutaneous route 4 (four) times daily. 100 each 12 04/14/2017 at Unknown time  . albuterol (PROVENTIL HFA;VENTOLIN HFA) 108 (90 Base) MCG/ACT inhaler Inhale 1 puff into the lungs every 6 (six) hours as needed for wheezing or shortness of breath (seasonal allergies).   Taking  . Blood Glucose Monitoring Suppl (ACCU-CHEK GUIDE) w/Device KIT 1 kit by Does not apply route 4 (four) times daily. 1 kit 0 04/14/2017 at Unknown time  . butalbital-acetaminophen-caffeine (FIORICET, ESGIC) 50-325-40 MG tablet Take 1-2 tablets by mouth every 6 (six) hours as needed. (Patient not taking: Reported on 04/10/2017) 45 tablet 4 Not Taking  . Elastic Bandages & Supports (COMFORT FIT MATERNITY SUPP LG) MISC 1 Units by Does not apply route daily. (Patient not taking: Reported on 05/21/2017) 1 each 0 Not Taking  . glucose blood (ACCU-CHEK GUIDE) test strip Use as instructed 100 each 12 04/14/2017 at Unknown time  . Prenat-FeAsp-Meth-FA-DHA w/o A (PRENATE PIXIE) 10-0.6-0.4-200 MG CAPS  Take 1 tablet by mouth daily. (Patient not taking: Reported on 04/18/2017) 30 capsule 12 Not Taking  . Prenatal-DSS-FeCb-FeGl-FA (CITRANATAL BLOOM) 90-1 MG TABS Take 1 tablet by mouth daily. 30 tablet 12 Taking    Review of Systems Physical Exam   Blood pressure (!) 143/81, pulse 75, temperature 98.5 F (36.9 C), temperature source Oral, resp. rate 17, height 4' 11"  (1.499 m), weight 65.8 kg (145 lb), last menstrual period 09/08/2016, unknown if currently breastfeeding.  Physical Exam  Pleasant female, no distress Neck supple Heart regular rate and rhythm, no murmur Lungs clear to auscultation bilaterally, no wheeze Abdomen gravid, nontender Pelvic SVE closed/thick/high No peripheral edema   FHT: baseline rate 140, moderate variability, +acel, no decel  MAU Course   Procedures  MDM  Given elevated blood pressures, labs performed and unremarkable  FHT reassuring - category 1. Reactive NST  Tocometry with irregular contractions  Assessment and Plan  G2P0 at 36 weeks 4 days here for contractions and found to have mildly elevated blood pressure.   1. False Labor - no evidence of labor and fetal well being reassuring; return to care precautions given including loss of fluid, decreased fetal movement, increased frequency/severity of contractions, vaginal bleeding 2. Elevated BP in pregnancy -  Labs reassuring, no signs/symptoms of pre-eclampsia; will have BP check in office arranged tomorrow 05/23/17 to evaluate for gestational hypertension and possible need for induction at 37 weeks.  She is given return to care precautions including headache, vision changes, RUQ/epigastric pain, increased edema  She is discharged in stable condition.  Clyde Hill DO PGY-2 05/22/2017, 8:57 PM   OB FELLOW MAU DISCHARGE ATTESTATION  I have seen and examined this patient; I agree with above documentation in the resident's note.  - Reactive NST - BP 140s/80s, asymptomatic. [place] labs wnl. BP check in clinic tomorrow.  Gailen Shelter, MD OB Fellow

## 2017-05-23 ENCOUNTER — Telehealth: Payer: Self-pay | Admitting: *Deleted

## 2017-05-23 LAB — STREP GP B NAA: Strep Gp B NAA: POSITIVE — AB

## 2017-05-23 NOTE — Telephone Encounter (Signed)
-----   Message from Larene BeachMary K Key, DO sent at 05/22/2017  8:57 PM EDT ----- Regarding: blood pressure check Patient seen in MAU and noted to have elevated blood pressure.  Labs unremarkable.  She needs a blood pressure check tomorrow 05/23/17.  Please call patient and arrange this.  Thank you very much.

## 2017-05-23 NOTE — Telephone Encounter (Signed)
Telephone call to patient to see if she could come in this afternoon for a BP check.  Patient was not available.  Left message for her to call the office.  Patient will need to come in today or in the morning for a repeat BP check.

## 2017-05-30 ENCOUNTER — Ambulatory Visit (INDEPENDENT_AMBULATORY_CARE_PROVIDER_SITE_OTHER): Payer: Medicaid Other | Admitting: Certified Nurse Midwife

## 2017-05-30 ENCOUNTER — Encounter (HOSPITAL_COMMUNITY): Payer: Self-pay

## 2017-05-30 ENCOUNTER — Inpatient Hospital Stay (HOSPITAL_COMMUNITY)
Admission: AD | Admit: 2017-05-30 | Discharge: 2017-06-03 | DRG: 775 | Disposition: A | Discharge status: Home / Self Care | Payer: Medicaid Other | Source: Ambulatory Visit | Attending: Obstetrics & Gynecology | Admitting: Obstetrics & Gynecology

## 2017-05-30 VITALS — BP 140/93 | HR 89 | Wt 141.6 lb

## 2017-05-30 DIAGNOSIS — Z3490 Encounter for supervision of normal pregnancy, unspecified, unspecified trimester: Secondary | ICD-10-CM

## 2017-05-30 DIAGNOSIS — O24419 Gestational diabetes mellitus in pregnancy, unspecified control: Secondary | ICD-10-CM

## 2017-05-30 DIAGNOSIS — O24420 Gestational diabetes mellitus in childbirth, diet controlled: Secondary | ICD-10-CM | POA: Diagnosis present

## 2017-05-30 DIAGNOSIS — O1414 Severe pre-eclampsia complicating childbirth: Secondary | ICD-10-CM | POA: Diagnosis present

## 2017-05-30 DIAGNOSIS — D649 Anemia, unspecified: Secondary | ICD-10-CM | POA: Diagnosis present

## 2017-05-30 DIAGNOSIS — O99013 Anemia complicating pregnancy, third trimester: Secondary | ICD-10-CM | POA: Diagnosis present

## 2017-05-30 DIAGNOSIS — J45909 Unspecified asthma, uncomplicated: Secondary | ICD-10-CM | POA: Diagnosis present

## 2017-05-30 DIAGNOSIS — Z3A37 37 weeks gestation of pregnancy: Secondary | ICD-10-CM

## 2017-05-30 DIAGNOSIS — O0932 Supervision of pregnancy with insufficient antenatal care, second trimester: Secondary | ICD-10-CM

## 2017-05-30 DIAGNOSIS — R51 Headache: Secondary | ICD-10-CM

## 2017-05-30 DIAGNOSIS — O162 Unspecified maternal hypertension, second trimester: Secondary | ICD-10-CM

## 2017-05-30 DIAGNOSIS — O9952 Diseases of the respiratory system complicating childbirth: Secondary | ICD-10-CM | POA: Diagnosis present

## 2017-05-30 DIAGNOSIS — R030 Elevated blood-pressure reading, without diagnosis of hypertension: Secondary | ICD-10-CM | POA: Diagnosis present

## 2017-05-30 DIAGNOSIS — O133 Gestational [pregnancy-induced] hypertension without significant proteinuria, third trimester: Secondary | ICD-10-CM

## 2017-05-30 DIAGNOSIS — O9902 Anemia complicating childbirth: Secondary | ICD-10-CM | POA: Diagnosis present

## 2017-05-30 DIAGNOSIS — O99824 Streptococcus B carrier state complicating childbirth: Secondary | ICD-10-CM | POA: Diagnosis present

## 2017-05-30 DIAGNOSIS — O9081 Anemia of the puerperium: Secondary | ICD-10-CM | POA: Diagnosis present

## 2017-05-30 DIAGNOSIS — O0993 Supervision of high risk pregnancy, unspecified, third trimester: Secondary | ICD-10-CM

## 2017-05-30 DIAGNOSIS — O26893 Other specified pregnancy related conditions, third trimester: Secondary | ICD-10-CM

## 2017-05-30 DIAGNOSIS — O163 Unspecified maternal hypertension, third trimester: Secondary | ICD-10-CM

## 2017-05-30 LAB — GLUCOSE, CAPILLARY
Glucose-Capillary: 69 mg/dL (ref 65–99)
Glucose-Capillary: 59 mg/dL — ABNORMAL LOW (ref 65–99)
Glucose-Capillary: 111 mg/dL — ABNORMAL HIGH (ref 65–99)

## 2017-05-30 LAB — CBC
HEMATOCRIT: 33.1 % — AB (ref 36.0–46.0)
HEMOGLOBIN: 11.2 g/dL — AB (ref 12.0–15.0)
MCH: 27.2 pg (ref 26.0–34.0)
MCHC: 33.8 g/dL (ref 30.0–36.0)
MCV: 80.3 fL (ref 78.0–100.0)
Platelets: 206 10*3/uL (ref 150–400)
RBC: 4.12 MIL/uL (ref 3.87–5.11)
RDW: 14.6 % (ref 11.5–15.5)
WBC: 6.8 10*3/uL (ref 4.0–10.5)

## 2017-05-30 LAB — COMPREHENSIVE METABOLIC PANEL
ALBUMIN: 3.2 g/dL — AB (ref 3.5–5.0)
ALK PHOS: 202 U/L — AB (ref 38–126)
ALT: 19 U/L (ref 14–54)
ANION GAP: 8 (ref 5–15)
AST: 25 U/L (ref 15–41)
BILIRUBIN TOTAL: 0.7 mg/dL (ref 0.3–1.2)
BUN: 6 mg/dL (ref 6–20)
CALCIUM: 8.7 mg/dL — AB (ref 8.9–10.3)
CO2: 21 mmol/L — AB (ref 22–32)
CREATININE: 0.46 mg/dL (ref 0.44–1.00)
Chloride: 106 mmol/L (ref 101–111)
GFR calc non Af Amer: >60 mL/min (ref >60)
Glucose, Bld: 75 mg/dL (ref 65–99)
Potassium: 3.6 mmol/L (ref 3.5–5.1)
Sodium: 135 mmol/L (ref 135–145)
TOTAL PROTEIN: 7.3 g/dL (ref 6.5–8.1)

## 2017-05-30 LAB — TYPE AND SCREEN
ABO/RH(D): B POS
ANTIBODY SCREEN: NEGATIVE

## 2017-05-30 LAB — ABO/RH: ABO/RH(D): B POS

## 2017-05-30 LAB — PROTEIN / CREATININE RATIO, URINE
Creatinine, Urine: 112 mg/dL
PROTEIN CREATININE RATIO: 0.11 mg/mg{creat} (ref 0.00–0.15)
TOTAL PROTEIN, URINE: 12 mg/dL

## 2017-05-30 MED ORDER — OXYTOCIN BOLUS FROM INFUSION
500.0000 mL | Freq: Once | INTRAVENOUS | Status: AC
  Administered 2017-06-01: 500 mL via INTRAVENOUS

## 2017-05-30 MED ORDER — SOD CITRATE-CITRIC ACID 500-334 MG/5ML PO SOLN: 30.0000 mL | ORAL | Status: DC | PRN

## 2017-05-30 MED ORDER — ACETAMINOPHEN 325 MG PO TABS
650.0000 mg | ORAL_TABLET | Freq: Once | ORAL | Status: AC
  Administered 2017-05-30: 650 mg via ORAL
  Filled 2017-05-30: qty 2

## 2017-05-30 MED ORDER — ZOLPIDEM TARTRATE 5 MG PO TABS
5.0000 mg | ORAL_TABLET | Freq: Every evening | ORAL | Status: DC | PRN
  Administered 2017-05-30: 5 mg via ORAL
  Filled 2017-05-30: qty 1

## 2017-05-30 MED ORDER — OXYTOCIN 40 UNITS IN LACTATED RINGERS INFUSION - SIMPLE MED: 2.5000 [IU]/h | INTRAVENOUS | Status: DC

## 2017-05-30 MED ORDER — OXYCODONE-ACETAMINOPHEN 5-325 MG PO TABS
1.0000 | ORAL_TABLET | ORAL | Status: DC | PRN
  Administered 2017-06-02 (×2): 1 via ORAL
  Filled 2017-05-30 (×2): qty 1

## 2017-05-30 MED ORDER — HYDRALAZINE HCL 20 MG/ML IJ SOLN: 10.0000 mg | Freq: Once | INTRAMUSCULAR | Status: DC | PRN

## 2017-05-30 MED ORDER — TERBUTALINE SULFATE 1 MG/ML IJ SOLN: 0.2500 mg | Freq: Once | INTRAMUSCULAR | Status: DC | PRN

## 2017-05-30 MED ORDER — MISOPROSTOL 200 MCG PO TABS
50.0000 ug | ORAL_TABLET | ORAL | Status: DC | PRN
  Administered 2017-05-30 – 2017-05-31 (×2): 50 ug via ORAL
  Filled 2017-05-30 (×2): qty 1

## 2017-05-30 MED ORDER — LABETALOL HCL 5 MG/ML IV SOLN
20.0000 mg | INTRAVENOUS | Status: DC | PRN
  Administered 2017-05-31: 40 mg via INTRAVENOUS
  Administered 2017-05-31: 20 mg via INTRAVENOUS
  Filled 2017-05-30: qty 4
  Filled 2017-05-30: qty 8

## 2017-05-30 MED ORDER — LIDOCAINE HCL (PF) 1 % IJ SOLN
30.0000 mL | INTRAMUSCULAR | Status: DC | PRN
  Filled 2017-05-30: qty 30

## 2017-05-30 MED ORDER — OXYCODONE-ACETAMINOPHEN 5-325 MG PO TABS
2.0000 | ORAL_TABLET | ORAL | Status: DC | PRN
  Administered 2017-06-03: 2 via ORAL
  Filled 2017-05-30: qty 2

## 2017-05-30 MED ORDER — LACTATED RINGERS IV SOLN
INTRAVENOUS | Status: DC
  Administered 2017-05-30 – 2017-06-01 (×6): via INTRAVENOUS

## 2017-05-30 MED ORDER — LACTATED RINGERS IV SOLN: 500.0000 mL | INTRAVENOUS | Status: DC | PRN

## 2017-05-30 MED ORDER — ONDANSETRON HCL 4 MG/2ML IJ SOLN
4.0000 mg | Freq: Four times a day (QID) | INTRAMUSCULAR | Status: DC | PRN
  Administered 2017-05-31 – 2017-06-01 (×2): 4 mg via INTRAVENOUS
  Filled 2017-05-30 (×2): qty 2

## 2017-05-30 MED ORDER — FLEET ENEMA 7-19 GM/118ML RE ENEM: 1.0000 | ENEMA | RECTAL | Status: DC | PRN

## 2017-05-30 MED ORDER — ACETAMINOPHEN 325 MG PO TABS
650.0000 mg | ORAL_TABLET | ORAL | Status: DC | PRN
  Administered 2017-05-30 – 2017-06-01 (×3): 650 mg via ORAL
  Filled 2017-05-30 (×3): qty 2

## 2017-05-30 NOTE — Progress Notes (Signed)
Patient complains of headaches =6  X 2 days, denies dizziness and auroras. BP 140/93-R arm; Pulse 89   135/93- L arm; Pulse 92.

## 2017-05-30 NOTE — Progress Notes (Signed)
IOL for gHTN. BPs elevated with last record of 151/89. PreE labs normal. Has labetalol protocol prn. If sever pressure or symptoms will start Magnesium. Also has GDM with last CBG 69. Continue to monitor. Is being induced with FB and cytotec.

## 2017-05-30 NOTE — MAU Note (Signed)
Pt sent from MD office for elevated BP & HA.  Reports contractions, denies bleeding or LOF.

## 2017-05-30 NOTE — Progress Notes (Signed)
Discussed with patient elevated blood pressures, hx of GDM this pregnancy.  Elevated blood pressures started on 05/22/17.  Now has a headache.  Sent to MAU for evaluation.  Report called to [facility].

## 2017-05-30 NOTE — MAU Note (Signed)
MD sent pt over because of high blood pressure and headache.

## 2017-05-30 NOTE — H&P (Signed)
Marie Cabrera is a 21 y.o. female G2P0010 at [redacted]w[redacted]d sent from the office for the evaluation of elavated blood pressure and headache for the past 2 days. Patient has not taken anything for her headache. Patient denies visual changes, RUQ/epigastric pain, nausea or emesis. BP in the office today 140/93. Patient also had elevated BP on 9/12 (143/81) during an MAU visit. Patient had She reports some occasional contractions. She has had prenatal care at [facility] complicated by GDMA1.   OB History    Gravida Para Term Preterm AB Living   2         0   SAB TAB Ectopic Multiple Live Births                 Past Medical History:  Diagnosis Date  . ADHD (attention deficit hyperactivity disorder)   . Amenorrhea    last menses 2007  . Asthma    last ospitalized >4 years ago.  NO recent problesm  . Heart murmur    [facility].  MD 2 d Echo 23012  . Rectal bleeding    Past Surgical History:  Procedure Laterality Date  . COLONOSCOPY  03/14/2012   Procedure: COLONOSCOPY;  Surgeon: Jon Gills, MD;  Location: [facility] OR;  Service: Gastroenterology;  Laterality: N/A;  . No surgical history     Family History: family history includes Alcohol abuse in her father, maternal grandmother, and mother; Colon cancer in her maternal grandfather; Colon polyps in her maternal aunt; Crohn's disease in her maternal aunt; Depression in her maternal grandmother and mother; Diabetes in her maternal grandmother; Drug abuse in her father and mother; Kidney disease in her maternal grandmother; Mental illness in her mother and sister; Other in her unknown relative. She was adopted. Social History:  reports that she has never smoked. She has never used smokeless tobacco. She reports that she does not drink alcohol or use drugs.     Maternal Diabetes: Yes:  Diabetes Type:  Diet controlled Genetic Screening: Normal Maternal Ultrasounds/Referrals: Normal Fetal Ultrasounds or other Referrals:  None Maternal Substance  Abuse:  Yes:  Type: Marijuana Significant Maternal Medications:  None Significant Maternal Lab Results:  None Other Comments:  None  ROS  See pertinent in HPI History   Blood pressure (!) 158/99, pulse 95, temperature 98.5 F (36.9 C), temperature source Oral, resp. rate 18, last menstrual period 09/08/2016, unknown if currently breastfeeding. Exam Physical Exam  GENERAL: Well-developed, well-nourished female in no acute distress.  HEENT: Normocephalic, atraumatic. Sclerae anicteric.  LUNGS: Clear to auscultation bilaterally.  HEART: Regular rate and rhythm. ABDOMEN: Soft, nontender, gravid PELVIC: Deffered to birthing suite EXTREMITIES: No cyanosis, clubbing, or edema, 2+ distal pulses.  Prenatal labs: ABO, Rh: B/Positive/-- (03/05 1559) Antibody: Negative (03/05 1559) Rubella: 4.18 (03/05 1559) RPR: Non Reactive (07/18 1025)  HBsAg: Negative (03/05 1559)  HIV:    GBS: Positive (09/11 1606)   Results for orders placed or performed during the hospital encounter of 05/30/17 (from the past 24 hour(s))  Protein / creatinine ratio, urine     Status: None   Collection Time: 05/30/17  2:10 PM  Result Value Ref Range   Creatinine, Urine 112.00 mg/dL   Total Protein, Urine 12 mg/dL   Protein Creatinine Ratio 0.11 0.00 - 0.15 mg/mg[Cre]  CBC     Status: Abnormal   Collection Time: 05/30/17  2:23 PM  Result Value Ref Range   WBC 6.8 4.0 - 10.5 K/uL   RBC 4.12 3.87 -  5.11 MIL/uL   Hemoglobin 11.2 (L) 12.0 - 15.0 g/dL   HCT 96.0 (L) 45.4 - 09.8 %   MCV 80.3 78.0 - 100.0 fL   MCH 27.2 26.0 - 34.0 pg   MCHC 33.8 30.0 - 36.0 g/dL   RDW 11.9 14.7 - 82.9 %   Platelets 206 150 - 400 K/uL  Comprehensive metabolic panel     Status: Abnormal   Collection Time: 05/30/17  2:23 PM  Result Value Ref Range   Sodium 135 135 - 145 mmol/L   Potassium 3.6 3.5 - 5.1 mmol/L   Chloride 106 101 - 111 mmol/L   CO2 21 (L) 22 - 32 mmol/L   Glucose, Bld 75 65 - 99 mg/dL   BUN 6 6 - 20 mg/dL    Creatinine, Ser 5.62 0.44 - 1.00 mg/dL   Calcium 8.7 (L) 8.9 - 10.3 mg/dL   Total Protein 7.3 6.5 - 8.1 g/dL   Albumin 3.2 (L) 3.5 - 5.0 g/dL   AST 25 15 - 41 U/L   ALT 19 14 - 54 U/L   Alkaline Phosphatase 202 (H) 38 - 126 U/L   Total Bilirubin 0.7 0.3 - 1.2 mg/dL   GFR calc non Af Amer >60 >60 mL/min   GFR calc Af Amer >60 >60 mL/min   Anion gap 8 5 - 15    Assessment/Plan: 21 yo G2P0010 at [redacted]w[redacted]d with gestational hypertension - Admit to L&D - Plan for IOL - Monitor BP - Monitor CBG - GBS prophylaxis when in labor - Tylenol for her headache now - Labor team has been notified  Adrien Shankar 05/30/2017, 3:02 PM

## 2017-05-31 LAB — COMPREHENSIVE METABOLIC PANEL
ALBUMIN: 3.1 g/dL — AB (ref 3.5–5.0)
ALK PHOS: 201 U/L — AB (ref 38–126)
ALT: 20 U/L (ref 14–54)
AST: 29 U/L (ref 15–41)
Anion gap: 8 (ref 5–15)
BILIRUBIN TOTAL: 1.2 mg/dL (ref 0.3–1.2)
BUN: 8 mg/dL (ref 6–20)
CALCIUM: 8.8 mg/dL — AB (ref 8.9–10.3)
CO2: 23 mmol/L (ref 22–32)
CREATININE: 0.65 mg/dL (ref 0.44–1.00)
Chloride: 105 mmol/L (ref 101–111)
GFR calc non Af Amer: >60 mL/min (ref >60)
GLUCOSE: 79 mg/dL (ref 65–99)
Potassium: 3.5 mmol/L (ref 3.5–5.1)
Sodium: 136 mmol/L (ref 135–145)
TOTAL PROTEIN: 6.9 g/dL (ref 6.5–8.1)

## 2017-05-31 LAB — CBC
HCT: 32.3 % — ABNORMAL LOW (ref 36.0–46.0)
Hemoglobin: 10.8 g/dL — ABNORMAL LOW (ref 12.0–15.0)
MCH: 26.9 pg (ref 26.0–34.0)
MCHC: 33.4 g/dL (ref 30.0–36.0)
MCV: 80.5 fL (ref 78.0–100.0)
PLATELETS: 213 10*3/uL (ref 150–400)
RBC: 4.01 MIL/uL (ref 3.87–5.11)
RDW: 14.6 % (ref 11.5–15.5)
WBC: 11.4 10*3/uL — AB (ref 4.0–10.5)

## 2017-05-31 LAB — GLUCOSE, CAPILLARY
GLUCOSE-CAPILLARY: 86 mg/dL (ref 65–99)
GLUCOSE-CAPILLARY: 62 mg/dL — AB (ref 65–99)
GLUCOSE-CAPILLARY: 109 mg/dL — AB (ref 65–99)
GLUCOSE-CAPILLARY: 76 mg/dL (ref 65–99)
GLUCOSE-CAPILLARY: 90 mg/dL (ref 65–99)
GLUCOSE-CAPILLARY: 73 mg/dL (ref 65–99)
Glucose-Capillary: 80 mg/dL (ref 65–99)
Glucose-Capillary: 91 mg/dL (ref 65–99)

## 2017-05-31 LAB — PROTEIN / CREATININE RATIO, URINE
Creatinine, Urine: 62 mg/dL
PROTEIN CREATININE RATIO: 0.11 mg/mg{creat} (ref 0.00–0.15)
TOTAL PROTEIN, URINE: 7 mg/dL

## 2017-05-31 LAB — RPR: RPR: NONREACTIVE

## 2017-05-31 MED ORDER — PENICILLIN G POTASSIUM 5000000 UNITS IJ SOLR
5.0000 10*6.[IU] | Freq: Once | INTRAVENOUS | Status: AC
  Administered 2017-05-31: 5 10*6.[IU] via INTRAVENOUS
  Filled 2017-05-31: qty 5

## 2017-05-31 MED ORDER — MAGNESIUM SULFATE 40 G IN LACTATED RINGERS - SIMPLE
2.0000 g/h | INTRAVENOUS | Status: DC
  Administered 2017-05-31 – 2017-06-01 (×2): 2 g/h via INTRAVENOUS
  Filled 2017-05-31 (×2): qty 500

## 2017-05-31 MED ORDER — LABETALOL HCL 5 MG/ML IV SOLN: 20.0000 mg | INTRAVENOUS | Status: DC | PRN

## 2017-05-31 MED ORDER — HYDRALAZINE HCL 20 MG/ML IJ SOLN: 10.0000 mg | Freq: Once | INTRAMUSCULAR | Status: DC | PRN

## 2017-05-31 MED ORDER — MAGNESIUM SULFATE BOLUS VIA INFUSION
4.0000 g | Freq: Once | INTRAVENOUS | Status: AC
  Administered 2017-05-31: 4 g via INTRAVENOUS
  Filled 2017-05-31: qty 500

## 2017-05-31 MED ORDER — TERBUTALINE SULFATE 1 MG/ML IJ SOLN: 0.2500 mg | Freq: Once | INTRAMUSCULAR | Status: DC | PRN

## 2017-05-31 MED ORDER — FENTANYL CITRATE (PF) 100 MCG/2ML IJ SOLN
100.0000 ug | INTRAMUSCULAR | Status: DC | PRN
  Administered 2017-05-31 – 2017-06-01 (×6): 100 ug via INTRAVENOUS
  Filled 2017-05-31 (×7): qty 2

## 2017-05-31 MED ORDER — OXYTOCIN 40 UNITS IN LACTATED RINGERS INFUSION - SIMPLE MED
1.0000 m[IU]/min | INTRAVENOUS | Status: DC
  Administered 2017-05-31: 2 m[IU]/min via INTRAVENOUS
  Administered 2017-05-31: 12 m[IU]/min via INTRAVENOUS
  Administered 2017-06-01: 26 m[IU]/min via INTRAVENOUS
  Filled 2017-05-31 (×2): qty 1000

## 2017-05-31 MED ORDER — PENICILLIN G POT IN DEXTROSE 60000 UNIT/ML IV SOLN
3.0000 10*6.[IU] | INTRAVENOUS | Status: DC
  Administered 2017-05-31 – 2017-06-01 (×6): 3 10*6.[IU] via INTRAVENOUS
  Filled 2017-05-31 (×9): qty 50

## 2017-05-31 NOTE — Progress Notes (Signed)
Labor Progress Note  S: Patient seen & examined for progress of labor. Patient reports being uncomfortable with contractions, but IV pain medications are helping.  O: BP (!) 147/87   Pulse 86   Temp 98.1 F (36.7 C) (Oral)   Resp 15   Ht  (1.499 m)   Wt 64 kg (141 lb)   LMP 09/08/2016   BMI 28.48 kg/m   FHT: 135bpm, mod var, -accels, no decels TOCO: q1-59min, patient uncomfortable with contractions  CVE: Dilation: 6 Effacement (%): 70, 80 Cervical Position: Middle Station: -1 Presentation: Vertex Exam by:: middleton rn  A&P: 21 y.o. G2P0 [redacted]w[redacted]d here for IOL d/t gHTN. Also with a1GDM BPs in 140s-160s systolic with several severe range. Mag started 1430 CBGs appropriate.  Pitocin @ 16 mu/min; S/p cytotec and FB;  AROM when appropriate and consider IUPC for better contraction monitoring Labor support Anticipate SVD  Caryl Ada, DO OB Fellow 05/31/2017, 7:37 PM

## 2017-05-31 NOTE — Progress Notes (Signed)
Labor Progress Note  S: Patient seen & examined for progress of labor. Patient comfortable, foley bulb in place.   O: BP 129/68   Pulse 82   Temp 98.3 F (36.8 C) (Oral)   Resp 18   Ht  (1.499 m)   Wt 64 kg (141 lb)   LMP 09/08/2016   BMI 28.48 kg/m   FHT: 145bpm, mod var, +accels, no decels TOCO: q2-23min, patient looks comfortable during contractions  CVE: Dilation: 1 Effacement (%): 50 Station: -3 Presentation: Vertex Exam by:: dr Doroteo Glassman  A&P: 21 y.o. G2P0 [redacted]w[redacted]d here for IOL d/t gHTN, A1GDM. No severe range pressures; last BP was 129/68, however there have been repeated elevated pressures to the 140s-150/90s.  Will start labetalol protocol if severe range pressures develop. Glucose remains <120, no need for glucose stabilizer at this time S/p cytotec 50 mcg PO x 1 Foley bulb in place Will give another cytotec 50 mcg PO if uterine activity slows down Anticipate SVD  Lezlie Octave, MD [facility] Resident PGY-1 05/31/2017 1:59 AM

## 2017-05-31 NOTE — Progress Notes (Signed)
Labor Progress Note  S: Patient seen & examined for progress of labor. Patient comfortable but feels sick due to the magnesium.   O: BP (!) 161/95   Pulse 86   Temp 98.3 F (36.8 C) (Oral)   Resp 18   Ht  (1.499 m)   Wt 64 kg (141 lb)   LMP 09/08/2016   BMI 28.48 kg/m   FHT: 130bpm, mod var, +accels, no decels TOCO: q2-42min, patient looks comfortable during contractions  CVE: Dilation: 4 Effacement (%): 80 Cervical Position: Middle Station: -1 Presentation: Vertex Exam by:: Lorn Junes, RN  A&P: 21 y.o. G2P0 [redacted]w[redacted]d here for IOL for gHTN, now with preeclampsia.  Continues to have elevated pressures including some in the severe range, with last BP of 161/95.  On labetalol protocol for this.  Will continue to monitor. Blood sugars have been consistently less than 100. Currently on 20 milli-units of pitocin Anticipate SVD  Lezlie Octave, MD [facility] Resident PGY-1 05/31/2017 10:33 PM

## 2017-05-31 NOTE — Anesthesia Pain Management Evaluation Note (Signed)
  CRNA Pain Management Visit Note  Patient: Marie Cabrera, 21 y.o., female  "Hello I am a member of the anesthesia team at [facility]. We have an anesthesia team available at all times to provide care throughout the hospital, including epidural management and anesthesia for C-section. I don't know your plan for the delivery whether it a natural birth, water birth, IV sedation, nitrous supplementation, doula or epidural, but we want to meet your pain goals."   1.Was your pain managed to your expectations on prior hospitalizations?   No prior hospitalizations  2.What is your expectation for pain management during this hospitalization?     IV pain meds  3.How can we help you reach that goal? Support prn  Record the patient's initial score and the patient's pain goal.   Pain: 5  Pain Goal: 7 [facility] wants you to be able to say your pain was always managed very well.  [facility] 05/31/2017

## 2017-05-31 NOTE — Progress Notes (Signed)
Labor Progress Note  S: Patient seen & examined for progress of labor. Patient comfortable. Requesting to eat breakfast before pitocin started.  O: BP 123/68   Pulse 87   Temp 98.6 F (37 C) (Oral)   Resp 18   Ht  (1.499 m)   Wt 64 kg (141 lb)   LMP 09/08/2016   BMI 28.48 kg/m   FHT: 140bpm, mod var, +accels, no decels TOCO: q2-52min, patient looks comfortable during contractions  CVE: Dilation: 5.5 Effacement (%): 50 Cervical Position: Middle Station: -2 Presentation: Vertex Exam by:: middleton rn  A&P: 21 y.o. G2P0 [redacted]w[redacted]d here for IOL d/t gHTN. Also with a1GDM BPs stable; no sever range pressures CBGs appropriate. Will now space to q2hr due to active labor S/p cytotec and FB; will start Pitocin after she eats for augmentation AROM when appropriate Labor support Anticipate SVD  Caryl Ada, DO OB Fellow 05/31/2017, 9:54 AM

## 2017-05-31 NOTE — Progress Notes (Signed)
Labor Progress Note  S: Patient seen & examined for progress of labor. Patient comfortable, foley bulb remains in place.   O: BP (!) 154/92   Pulse 78   Temp 98.6 F (37 C) (Oral)   Resp 16   Ht  (1.499 m)   Wt 64 kg (141 lb)   LMP 09/08/2016   BMI 28.48 kg/m   FHT: 145bpm, mod var, +accels, no decels TOCO: q2-49min, patient looks comfortable during contractions  CVE: Dilation: 1 Effacement (%): 50 Station: -3 Presentation: Vertex Exam by:: dr Doroteo Glassman  A&P: 21 y.o. G2P0 [redacted]w[redacted]d here for IOL d/t gHTN, A1GDM. No severe range pressures; last BP was elevated at 154/92.  Will start labetalol protocol if severe range pressures develop. Glucose remains <120, no need for glucose stabilizer at this time S/p cytotec 50 mcg PO x 2 Foley bulb in place Anticipate SVD  Lezlie Octave, MD [facility] Resident PGY-1 05/31/2017 6:37 AM

## 2017-06-01 ENCOUNTER — Encounter (HOSPITAL_COMMUNITY): Payer: Self-pay

## 2017-06-01 ENCOUNTER — Inpatient Hospital Stay (HOSPITAL_COMMUNITY): Payer: Medicaid Other | Admitting: Anesthesiology

## 2017-06-01 DIAGNOSIS — Z3A37 37 weeks gestation of pregnancy: Secondary | ICD-10-CM

## 2017-06-01 DIAGNOSIS — O99824 Streptococcus B carrier state complicating childbirth: Secondary | ICD-10-CM

## 2017-06-01 LAB — CBC
HCT: 32.6 % — ABNORMAL LOW (ref 36.0–46.0)
HCT: 30.3 % — ABNORMAL LOW (ref 36.0–46.0)
Hemoglobin: 11 g/dL — ABNORMAL LOW (ref 12.0–15.0)
Hemoglobin: 10.4 g/dL — ABNORMAL LOW (ref 12.0–15.0)
MCH: 26.6 pg (ref 26.0–34.0)
MCH: 27.2 pg (ref 26.0–34.0)
MCHC: 33.7 g/dL (ref 30.0–36.0)
MCHC: 34.3 g/dL (ref 30.0–36.0)
MCV: 78.9 fL (ref 78.0–100.0)
MCV: 79.3 fL (ref 78.0–100.0)
PLATELETS: 198 10*3/uL (ref 150–400)
Platelets: 213 10*3/uL (ref 150–400)
RBC: 4.13 MIL/uL (ref 3.87–5.11)
RBC: 3.82 MIL/uL — AB (ref 3.87–5.11)
RDW: 14.5 % (ref 11.5–15.5)
RDW: 14.6 % (ref 11.5–15.5)
WBC: 14.6 10*3/uL — ABNORMAL HIGH (ref 4.0–10.5)
WBC: 15 10*3/uL — ABNORMAL HIGH (ref 4.0–10.5)

## 2017-06-01 LAB — GLUCOSE, CAPILLARY
GLUCOSE-CAPILLARY: 93 mg/dL (ref 65–99)
GLUCOSE-CAPILLARY: 88 mg/dL (ref 65–99)
Glucose-Capillary: 105 mg/dL — ABNORMAL HIGH (ref 65–99)
Glucose-Capillary: 116 mg/dL — ABNORMAL HIGH (ref 65–99)
Glucose-Capillary: 87 mg/dL (ref 65–99)
Glucose-Capillary: 87 mg/dL (ref 65–99)
Glucose-Capillary: 90 mg/dL (ref 65–99)

## 2017-06-01 MED ORDER — IBUPROFEN 600 MG PO TABS
600.0000 mg | ORAL_TABLET | Freq: Four times a day (QID) | ORAL | Status: DC
  Administered 2017-06-02 – 2017-06-03 (×6): 600 mg via ORAL
  Filled 2017-06-01 (×6): qty 1

## 2017-06-01 MED ORDER — SIMETHICONE 80 MG PO CHEW: 80.0000 mg | CHEWABLE_TABLET | ORAL | Status: DC | PRN

## 2017-06-01 MED ORDER — EPHEDRINE 5 MG/ML INJ: 10.0000 mg | INTRAVENOUS | Status: DC | PRN

## 2017-06-01 MED ORDER — MISOPROSTOL 200 MCG PO TABS
ORAL_TABLET | ORAL | Status: AC
  Filled 2017-06-01: qty 4

## 2017-06-01 MED ORDER — LACTATED RINGERS IV SOLN
500.0000 mL | Freq: Once | INTRAVENOUS | Status: AC
  Administered 2017-06-01: 500 mL via INTRAVENOUS

## 2017-06-01 MED ORDER — HYDRALAZINE HCL 20 MG/ML IJ SOLN: 10.0000 mg | Freq: Once | INTRAMUSCULAR | Status: DC | PRN

## 2017-06-01 MED ORDER — LABETALOL HCL 5 MG/ML IV SOLN
20.0000 mg | INTRAVENOUS | Status: DC | PRN
Start: 2017-06-01 — End: 2017-06-03

## 2017-06-01 MED ORDER — TETANUS-DIPHTH-ACELL PERTUSSIS 5-2.5-18.5 LF-MCG/0.5 IM SUSP: 0.5000 mL | Freq: Once | INTRAMUSCULAR | Status: DC

## 2017-06-01 MED ORDER — ZOLPIDEM TARTRATE 5 MG PO TABS: 5.0000 mg | ORAL_TABLET | Freq: Every evening | ORAL | Status: DC | PRN

## 2017-06-01 MED ORDER — FENTANYL 2.5 MCG/ML BUPIVACAINE 1/10 % EPIDURAL INFUSION (WH - ANES)
14.0000 mL/h | INTRAMUSCULAR | Status: DC | PRN
Start: 2017-06-01 — End: 2017-06-01
  Administered 2017-06-01 (×2): 14 mL/h via EPIDURAL
  Filled 2017-06-01 (×2): qty 100

## 2017-06-01 MED ORDER — ALBUTEROL SULFATE (2.5 MG/3ML) 0.083% IN NEBU: 2.5000 mg | INHALATION_SOLUTION | Freq: Four times a day (QID) | RESPIRATORY_TRACT | Status: DC | PRN

## 2017-06-01 MED ORDER — ACETAMINOPHEN 325 MG PO TABS: 650.0000 mg | ORAL_TABLET | ORAL | Status: DC | PRN

## 2017-06-01 MED ORDER — PHENYLEPHRINE 40 MCG/ML (10ML) SYRINGE FOR IV PUSH (FOR BLOOD PRESSURE SUPPORT): 80.0000 ug | PREFILLED_SYRINGE | INTRAVENOUS | Status: DC | PRN

## 2017-06-01 MED ORDER — ONDANSETRON HCL 4 MG PO TABS: 4.0000 mg | ORAL_TABLET | ORAL | Status: DC | PRN

## 2017-06-01 MED ORDER — DIBUCAINE 1 % RE OINT: 1.0000 "application " | TOPICAL_OINTMENT | RECTAL | Status: DC | PRN

## 2017-06-01 MED ORDER — COCONUT OIL OIL
1.0000 "application " | TOPICAL_OIL | Status: DC | PRN
  Administered 2017-06-02: 1 via TOPICAL
  Filled 2017-06-01: qty 120

## 2017-06-01 MED ORDER — DIPHENHYDRAMINE HCL 25 MG PO CAPS: 25.0000 mg | ORAL_CAPSULE | Freq: Four times a day (QID) | ORAL | Status: DC | PRN

## 2017-06-01 MED ORDER — ONDANSETRON HCL 4 MG/2ML IJ SOLN: 4.0000 mg | INTRAMUSCULAR | Status: DC | PRN

## 2017-06-01 MED ORDER — MAGNESIUM SULFATE 40 G IN LACTATED RINGERS - SIMPLE
2.0000 g/h | INTRAVENOUS | Status: AC
  Administered 2017-06-02: 2 g/h via INTRAVENOUS
  Filled 2017-06-01: qty 40

## 2017-06-01 MED ORDER — PRENATAL MULTIVITAMIN CH
1.0000 | ORAL_TABLET | Freq: Every day | ORAL | Status: DC
  Administered 2017-06-02: 1 via ORAL
  Filled 2017-06-01: qty 1

## 2017-06-01 MED ORDER — SENNOSIDES-DOCUSATE SODIUM 8.6-50 MG PO TABS
2.0000 | ORAL_TABLET | ORAL | Status: DC
  Administered 2017-06-02: 2 via ORAL
  Filled 2017-06-01 (×2): qty 2

## 2017-06-01 MED ORDER — MISOPROSTOL 200 MCG PO TABS
800.0000 ug | ORAL_TABLET | Freq: Once | ORAL | Status: AC
  Administered 2017-06-01: 800 ug via BUCCAL

## 2017-06-01 MED ORDER — PHENYLEPHRINE 40 MCG/ML (10ML) SYRINGE FOR IV PUSH (FOR BLOOD PRESSURE SUPPORT)
80.0000 ug | PREFILLED_SYRINGE | INTRAVENOUS | Status: DC | PRN
  Filled 2017-06-01: qty 10

## 2017-06-01 MED ORDER — LABETALOL HCL 5 MG/ML IV SOLN
20.0000 mg | Freq: Once | INTRAVENOUS | Status: DC
  Filled 2017-06-01: qty 4

## 2017-06-01 MED ORDER — WITCH HAZEL-GLYCERIN EX PADS: 1.0000 "application " | MEDICATED_PAD | CUTANEOUS | Status: DC | PRN

## 2017-06-01 MED ORDER — BENZOCAINE-MENTHOL 20-0.5 % EX AERO
1.0000 "application " | INHALATION_SPRAY | CUTANEOUS | Status: DC | PRN
  Administered 2017-06-02: 1 via TOPICAL
  Filled 2017-06-01: qty 56

## 2017-06-01 MED ORDER — DIPHENHYDRAMINE HCL 50 MG/ML IJ SOLN: 12.5000 mg | INTRAMUSCULAR | Status: DC | PRN

## 2017-06-01 NOTE — Progress Notes (Signed)
Labor Progress Note Marie Cabrera is a 21 y.o. G2P0 at [redacted]w[redacted]d presented for IOL 2/2 preeclampsia, on magnesium  S: Patient says she feels "alot of pressure" and feels like she has to "poop"  O:  BP (!) 141/86   Pulse 89   Temp 98.2 F (36.8 C) (Axillary)   Resp 18   Ht  (1.499 m)   Wt 64 kg (141 lb)   LMP 09/08/2016   SpO2 99%   BMI 28.48 kg/m    EFM: 150/pos acels/no decels. Ctx infrequent  DTEs: 2/4 bilateral upper and lower extremities  CVE: Dilation: 7 Effacement (%): 80 Cervical Position: Anterior Station: +1 Presentation: Vertex Exam by:: dr Janai Maudlin   A&P: 21 y.o. G2P0 [redacted]w[redacted]d here for IOL 2/2 preeclampsia, on magnesium #Labor: Progressing well. Continue to monitor. Recheck when patient feels more uncomfortable or need to push #preeclampsia: overall bps well controlled. Most recent bp 141/86. Continue magnesium, continue to monitor #Pain: well controlled with epidural #GBS positive   Rolm Bookbinder, DO 12:53 PM

## 2017-06-01 NOTE — Anesthesia Preprocedure Evaluation (Signed)
Anesthesia Evaluation  Patient identified by MRN, date of birth, ID band Patient awake    Airway Mallampati: II       Dental no notable dental hx.    Pulmonary neg shortness of breath, asthma , neg COPD, neg recent URI,    Pulmonary exam normal        Cardiovascular hypertension, negative cardio ROS Normal cardiovascular exam+ Valvular Problems/Murmurs      Neuro/Psych    GI/Hepatic negative GI ROS, Neg liver ROS,   Endo/Other  diabetes  Renal/GU negative Renal ROS  negative genitourinary   Musculoskeletal negative musculoskeletal ROS (+)   Abdominal Normal abdominal exam  (+)   Peds negative pediatric ROS (+)  Hematology negative hematology ROS (+) anemia ,   Anesthesia Other Findings   Reproductive/Obstetrics (+) Pregnancy                             Anesthesia Physical Anesthesia Plan  ASA: II  Anesthesia Plan: Epidural   Post-op Pain Management:    Induction:   PONV Risk Score and Plan: 0  Airway Management Planned:   Additional Equipment:   Intra-op Plan:   Post-operative Plan:   Informed Consent: I have reviewed the patients History and Physical, chart, labs and discussed the procedure including the risks, benefits and alternatives for the proposed anesthesia with the patient or authorized representative who has indicated his/her understanding and acceptance.     Plan Discussed with:   Anesthesia Plan Comments:         Anesthesia Quick Evaluation

## 2017-06-01 NOTE — Progress Notes (Signed)
Labor Progress Note Marie Cabrera is a 21 y.o. G2P0 at [redacted]w[redacted]d presented for IOL 2/2 preeclampsia, on magnesium   S: Patient has no complaints  O:  BP 133/79   Pulse 91   Temp 98.2 F (36.8 C) (Axillary)   Resp 18   Ht  (1.499 m)   Wt 64 kg (141 lb)   LMP 09/08/2016   SpO2 99%   BMI 28.48 kg/m   Bedside ultrasound showed cephalic position, occiput posterior  CVE: Dilation: 6.5 Effacement (%): 80 Cervical Position: Middle Station: 0 Presentation: Vertex Exam by:: dr Christy Ehrsam   FHT: 150bpm/pos acels/no decels. Ctx q69min   A&P: 21 y.o. G2P0 [redacted]w[redacted]d here for IOL 2/2 preeclampsia #Labor: Progressing well. Peanut ball and position change to try to change baby's position #Pain: epidural #GBS positive #preeclampsia: recent blood pressures well controlled. Urine output adequate. Continue magnesium.  Rolm Bookbinder, DO 10:54 AM

## 2017-06-01 NOTE — Progress Notes (Signed)
Labor Progress Note  S: Patient seen & examined for progress of labor. Patient more uncomfortable now, requesting pain medication.   O: BP (!) 151/91 (BP Location: Left Arm)   Pulse 87   Temp 98.1 F (36.7 C) (Oral)   Resp 18   Ht  (1.499 m)   Wt 64 kg (141 lb)   LMP 09/08/2016   BMI 28.48 kg/m   FHT: 130bpm, mod var, +accels, no decels TOCO: q2-72min, patient looks comfortable during contractions  CVE: Dilation: 4 Effacement (%): 80 Cervical Position: Middle Station: -1 Presentation: Vertex Exam by:: Philipp Deputy, CNM   A&P: 21 y.o. G2P0 [redacted]w[redacted]d here for IOL for gHTN, now with preeclampsia.  Continues to have elevated pressures including some in the severe range, with last BP of 151/91.  On labetalol protocol for this.  Will continue to monitor. Blood sugars have been consistently less than 100. Currently on 20 milli-units of pitocin; will continue at this rate due to patient's lack of response to uptitration AROM @ 0056 w/ clear fluid.  Hopefully this will help labor progress. Anticipate SVD  Lezlie Octave, MD [facility] Resident PGY-1 06/01/2017 1:15 AM

## 2017-06-01 NOTE — Progress Notes (Signed)
Evaluated patient at bedside. She still complains of pressure.  Cervix: 7.5/80/+2 FHT: 150/pos acels/no decels  Placed IUPC. Increase pitocin. Anticipate SVD.  Rolm Bookbinder, DO [place] Fellow

## 2017-06-01 NOTE — Progress Notes (Signed)
Called MD to re-order labetalol protocol. Still waiting on pharmacy to verify med.

## 2017-06-01 NOTE — Progress Notes (Signed)
Patient ID: Marie Cabrera, female   DOB: April 10, 1996, 21 y.o.   MRN: [id number]  Comfortable w/ epidural; on mag  BP 129/78, other VSS FHR 130s, mod LTV, some 10x10accels, occ variables Ctx q 3-4 mins w/ Pit@ 43mu/min Cx 6/90/0  IUP@term  Early active labor GDMA1 Pre-e  Plan to check cx in 2 hrs or sooner prn Anticipate SVD  Clelia Croft, [facility] 06/01/2017

## 2017-06-01 NOTE — Anesthesia Procedure Notes (Signed)
Epidural Patient location during procedure: OB Start time: 06/01/2017 3:25 AM  Staffing Anesthesiologist: Odette Fraction Performed: anesthesiologist   Preanesthetic Checklist Completed: patient identified, site marked, surgical consent, pre-op evaluation, timeout performed, IV checked, risks and benefits discussed and monitors and equipment checked  Epidural Patient position: sitting Prep: DuraPrep Patient monitoring: heart rate, continuous pulse ox and blood pressure Injection technique: LOR saline  Needle:  Needle type: Tuohy  Needle gauge: 17 G Needle length: 9 cm and 9 Needle insertion depth: 6 cm Catheter type: closed end flexible Catheter size: 20 Guage Catheter at skin depth: 12 cm Test dose: negative and 2% lidocaine with Epi 1:200 K  Assessment Sensory level: T8 Events: blood not aspirated, injection not painful, no injection resistance, negative IV test and no paresthesia  Additional Notes Patient identified. Risks/Benefits/Options discussed with patient including but not limited to bleeding, infection, nerve damage, paralysis, failed block, incomplete pain control, headache, blood pressure changes, nausea, vomiting, reactions to medication both or allergic, itching and postpartum back pain. Confirmed with bedside nurse the patient's most recent platelet count. Confirmed with patient that they are not currently taking any anticoagulation, have any bleeding history or any family history of bleeding disorders. Patient expressed understanding and wished to proceed. All questions were answered. Sterile technique was used throughout the entire procedure. Please see nursing notes for vital signs. Test dose was given through epidural needle and negative prior to continuing to dose epidural or start infusion.    All questions and concerns addressed with instructions to call with any issues.

## 2017-06-02 ENCOUNTER — Encounter (HOSPITAL_COMMUNITY): Payer: Self-pay

## 2017-06-02 DIAGNOSIS — O9081 Anemia of the puerperium: Secondary | ICD-10-CM | POA: Diagnosis present

## 2017-06-02 LAB — CBC
HCT: 26.3 % — ABNORMAL LOW (ref 36.0–46.0)
Hemoglobin: 9.2 g/dL — ABNORMAL LOW (ref 12.0–15.0)
MCH: 27.2 pg (ref 26.0–34.0)
MCHC: 35 g/dL (ref 30.0–36.0)
MCV: 77.8 fL — ABNORMAL LOW (ref 78.0–100.0)
PLATELETS: 204 10*3/uL (ref 150–400)
RBC: 3.38 MIL/uL — ABNORMAL LOW (ref 3.87–5.11)
RDW: 14.5 % (ref 11.5–15.5)
WBC: 15.4 10*3/uL — ABNORMAL HIGH (ref 4.0–10.5)

## 2017-06-02 LAB — GLUCOSE, CAPILLARY: GLUCOSE-CAPILLARY: 96 mg/dL (ref 65–99)

## 2017-06-02 MED ORDER — FERROUS SULFATE 325 (65 FE) MG PO TABS
325.0000 mg | ORAL_TABLET | Freq: Two times a day (BID) | ORAL | Status: DC
  Administered 2017-06-02 – 2017-06-03 (×2): 325 mg via ORAL
  Filled 2017-06-02 (×2): qty 1

## 2017-06-02 MED ORDER — AMLODIPINE BESYLATE 10 MG PO TABS
10.0000 mg | ORAL_TABLET | Freq: Every day | ORAL | Status: DC
  Administered 2017-06-02 – 2017-06-03 (×2): 10 mg via ORAL
  Filled 2017-06-02 (×2): qty 1

## 2017-06-02 NOTE — Progress Notes (Signed)
CSW received consult for hx of marijuana use.  Referral was screened out due to the following: ~[name] had no documented substance use after initial prenatal visit/+UPT. ~[name] had no positive drug screens after initial prenatal visit/+UPT. ~Baby's UDS is negative.  Please consult CSW if current concerns arise or by [name]'s request.  CSW will monitor CDS results and make report to Child Protective Services if warranted.  Emmelia Holdsworth, MSW, LCSW-A Clinical Social Worker  [facility]  Office: [phone]   

## 2017-06-02 NOTE — Anesthesia Postprocedure Evaluation (Signed)
Anesthesia Post Note  Patient: Marie Cabrera  Procedure(s) Performed: * No procedures listed *     Patient location during evaluation: [facility] Unit Anesthesia Type: Epidural Level of consciousness: awake and alert and oriented Pain management: pain level controlled Vital Signs Assessment: post-procedure vital signs reviewed and stable Respiratory status: spontaneous breathing and nonlabored ventilation Cardiovascular status: stable Postop Assessment: no headache, patient able to bend at knees, no backache, no apparent nausea or vomiting, epidural receding and adequate PO intake Anesthetic complications: no    Last Vitals:  Vitals:   06/02/17 0500 06/02/17 0600  BP:    Pulse:    Resp: 17 16  Temp:    SpO2:      Last Pain:  Vitals:   06/02/17 0600  TempSrc:   PainSc: 3    Pain Goal: Patients Stated Pain Goal: 3 (06/02/17 0600)               [name]

## 2017-06-02 NOTE — Lactation Note (Signed)
This note was copied from a baby's chart. Lactation Consultation Note  Patient Name: Marie Cabrera ONGEX'B Date: 06/02/2017 Reason for consult: Follow-up assessment;1st time breastfeeding   Follow up with mom of 16 hour old infant to set up DEBP. DEBP set up with instructions of assembling, disassembling and cleaning of pump parts. Reviewed colostrum, milk coming to volume and what to expect with pumping. Enc mom to pump about every 3 hours or around infant feeding times. Mom is aware to give formula until EBM is in. Mom was shown how to how to hand express, mom returned demonstration. Enc mom to pump every 2-3 hours on Initiate setting followed by hand expression. Mom was pumping when LC left room. Mom to call out for assistance as needed. Reviewed breast milk storage with mom.    Maternal Data Formula Feeding for Exclusion: No Has patient been taught Hand Expression?: Yes Does the patient have breastfeeding experience prior to this delivery?: No  Feeding    LATCH Score                   Interventions    Lactation Tools Discussed/Used WIC Program: Yes Pump Review: Setup, frequency, and cleaning;Milk Storage Initiated by:: Noralee Stain, RN, IBCLC Date initiated:: 06/02/17   Consult Status Consult Status: Follow-up Date: 06/03/17 Follow-up type: In-patient    Silas Flood Waino Mounsey 06/02/2017, 10:18 AM

## 2017-06-02 NOTE — Progress Notes (Signed)
Post Partum Day 1 s/p SVD in the setting of severe preeclampsia, A1GDM Subjective: Denies headache, vision changes, RUQ/epigastric pain.  Still on magnesium sulfate. Not OOB much yet.    Objective: Blood pressure (!) 142/90, pulse 78, temperature 97.7 F (36.5 C), temperature source Oral, resp. rate 17, height  (1.499 m), weight 141 lb (64 kg), last menstrual period 09/08/2016, SpO2 98 %, unknown if currently breastfeeding.  Patient Vitals for the past 24 hrs:  BP Temp Temp src Pulse Resp SpO2  06/02/17 0831 (!) 142/90 97.7 F (36.5 C) Oral 78 17 98 %  06/02/17 0600 - - - - 16 -  06/02/17 0500 - - - - 17 -  06/02/17 0400 (!) 144/77 98.7 F (37.1 C) Oral 91 18 97 %  06/02/17 0300 - - - - 17 -  06/02/17 0200 - - - - 18 -  06/02/17 0100 - - - - 18 -  06/02/17 0001 (!) 155/90 99.3 F (37.4 C) Oral 90 18 98 %  06/01/17 2300 - - - - 17 -  06/01/17 2200 - - - - 18 -  06/01/17 2115 (!) 120/94 99.5 F (37.5 C) Oral (!) 113 - -  06/01/17 2048 (!) 162/96 98.3 F (36.8 C) - 81 18 100 %  06/01/17 2015 (!) 141/89 - - 84 - -  06/01/17 2002 (!) 152/90 - - 78 - -  06/01/17 2000 (!) 152/90 - - 78 - -  06/01/17 1945 (!) 145/92 - - 83 - -  06/01/17 1934 (!) 150/92 - - 83 18 -  06/01/17 1915 (!) 150/89 - - 87 - -  06/01/17 1900 133/86 - - 87 18 -  06/01/17 1845 (!) 141/89 - - 88 18 -  06/01/17 1829 (!) 145/87 98 F (36.7 C) Oral 95 18 -  06/01/17 1801 (!) 146/88 - - 89 18 -  06/01/17 1731 (!) 146/89 - - 86 18 -  06/01/17 1709 (!) 144/91 - - 89 - -  06/01/17 1631 (!) 149/72 - - 94 18 -  06/01/17 1601 140/84 97.9 F (36.6 C) Axillary 86 18 -  06/01/17 1531 132/83 - - 87 18 -  06/01/17 1501 132/82 - - 88 - -  06/01/17 1400 134/79 98.2 F (36.8 C) Axillary 96 18 -  06/01/17 1331 127/75 - - 93 - -  06/01/17 1301 134/80 - - 91 18 -  06/01/17 1255 - 97.9 F (36.6 C) Oral - - -  06/01/17 1231 137/83 - - 91 18 -  06/01/17 1201 (!) 141/86 - - 89 18 -  06/01/17 1130 130/78 - - 94 18 -   06/01/17 1100 125/74 - - 93 18 -  06/01/17 1031 133/79 - - 91 18 -  06/01/17 1005 128/79 98.2 F (36.8 C) Axillary 97 18 -     Intake/Output Summary (Last 24 hours) at 06/02/17 0905 Last data filed at 06/02/17 1610  Gross per 24 hour  Intake          3562.69 ml  Output             8169 ml  Net         -4606.31 ml    Physical Exam:  General: alert and no distress Lochia: appropriate Uterine Fundus: firm DVT Evaluation: No evidence of DVT seen on physical exam. Negative Homan's sign.   Results for orders placed or performed during the hospital encounter of 05/30/17 (from the past 24 hour(s))  Glucose, capillary  Status: None   Collection Time: 06/01/17 10:02 AM  Result Value Ref Range   Glucose-Capillary 93 65 - 99 mg/dL  Glucose, capillary     Status: None   Collection Time: 06/01/17 12:11 PM  Result Value Ref Range   Glucose-Capillary 88 65 - 99 mg/dL  Glucose, capillary     Status: None   Collection Time: 06/01/17  4:25 PM  Result Value Ref Range   Glucose-Capillary 87 65 - 99 mg/dL  CBC     Status: Abnormal   Collection Time: 06/01/17  7:48 PM  Result Value Ref Range   WBC 15.0 (H) 4.0 - 10.5 K/uL   RBC 3.82 (L) 3.87 - 5.11 MIL/uL   Hemoglobin 10.4 (L) 12.0 - 15.0 g/dL   HCT 95.6 (L) 21.3 - 08.6 %   MCV 79.3 78.0 - 100.0 fL   MCH 27.2 26.0 - 34.0 pg   MCHC 34.3 30.0 - 36.0 g/dL   RDW 57.8 46.9 - 62.9 %   Platelets 198 150 - 400 K/uL  CBC     Status: Abnormal   Collection Time: 06/02/17  5:29 AM  Result Value Ref Range   WBC 15.4 (H) 4.0 - 10.5 K/uL   RBC 3.38 (L) 3.87 - 5.11 MIL/uL   Hemoglobin 9.2 (L) 12.0 - 15.0 g/dL   HCT 52.8 (L) 41.3 - 24.4 %   MCV 77.8 (L) 78.0 - 100.0 fL   MCH 27.2 26.0 - 34.0 pg   MCHC 35.0 30.0 - 36.0 g/dL   RDW 01.0 27.2 - 53.6 %   Platelets 204 150 - 400 K/uL  Glucose, capillary     Status: None   Collection Time: 06/02/17  6:12 AM  Result Value Ref Range   Glucose-Capillary 96 65 - 99 mg/dL    Assessment/Plan: Amlodipine 10 mg started for BP control; will monitor s/p magnesium sulfate which will be off at 1800. Oral iron therapy started for anemia. Normal CBGs, no therapy needed Routine postpartum care.   LOS: 3 days   Jaynie Collins, MD 06/02/2017, 9:01 AM

## 2017-06-02 NOTE — Lactation Note (Signed)
This note was copied from a baby's chart. Lactation Consultation Note  Patient Name: Boy Niamh Rada MMITV'I Date: 06/02/2017 Reason for consult: Initial assessment;Infant < 6lbs;Early term 37-38.6wks;1st time breastfeeding   Initial consult with mom of 16 hour old infant. Infant with 1 BF attempt, 2 formula feeds of 11-14 cc, 1 void, 2 stools, and 1 emesis since birth. Infant weight 5 lb 15.1 oz with 3% weight loss since birth.   Mom was asleep when LC entered room. She was easily arousable mom is on MgSO4. Mom reports she would like to pump and bottle feed. Infant is currently asleep in the crib.   Enc mom to feed infant every 3 hours. She has supplementation amount sheet in the room and reviewed when  Not putting infant to breast she should follow 2nd column for amounts to feed infant.   Pump and pump kit in the room to set up when mom gets up, Zelvia RN was informed of mom's plans and that pump needs to be set up today. Discussed pumping every 2-3 hours for 15-20 minutes, followed by hand expression. Mom will need to be taught to hand express.   BF Resources Handout and [place] Brochure given, mom informed of IP/OP Services, BF Support Groups and [place] phone #. Mom is a [facility] Client and does not have a pump. [place] referral faxed to [facility] office for pump with mom's verbal permission.    Maternal Data Formula Feeding for Exclusion: Yes  Feeding    LATCH Score                   Interventions    Lactation Tools Discussed/Used WIC Program: Yes   Consult Status Consult Status: Follow-up Date: 06/03/17 Follow-up type: In-patient    Debby Freiberg Jannelle Notaro 06/02/2017, 7:34 AM

## 2017-06-03 ENCOUNTER — Other Ambulatory Visit: Payer: Self-pay | Admitting: Certified Nurse Midwife

## 2017-06-03 LAB — GLUCOSE, CAPILLARY: Glucose-Capillary: 107 mg/dL — ABNORMAL HIGH (ref 65–99)

## 2017-06-03 MED ORDER — IBUPROFEN 600 MG PO TABS: 600.0000 mg | ORAL_TABLET | Freq: Four times a day (QID) | ORAL | 0 refills | Status: DC

## 2017-06-03 MED ORDER — AMLODIPINE BESYLATE 10 MG PO TABS: 10.0000 mg | ORAL_TABLET | Freq: Every day | ORAL | 2 refills | Status: DC

## 2017-06-03 MED ORDER — FERROUS SULFATE 325 (65 FE) MG PO TABS: 325.0000 mg | ORAL_TABLET | Freq: Two times a day (BID) | ORAL | 3 refills | Status: DC

## 2017-06-03 NOTE — Progress Notes (Signed)
Pt discharged with printed instructions. Pt verbalized an understanding. No concerns noted. Khristine Verno L Nashira Mcglynn, RN 

## 2017-06-03 NOTE — Discharge Summary (Signed)
OB Discharge Summary     Patient Name: Marie Cabrera DOB: [birth year] MRN: [id number]  Date of admission: 05/30/2017 Delivering MD: Dannielle Huh   Date of discharge: 06/03/2017  Admitting diagnosis: 37WKS,BP,HA Intrauterine pregnancy: [redacted]w[redacted]d    Secondary diagnosis:  Principal Problem:   Hypertension in pregnancy, preeclampsia, severe, delivered Active Problems:   Limited prenatal care in second trimester   Anemia affecting pregnancy in third trimester   GDM (gestational diabetes mellitus)   NSVD (normal spontaneous vaginal delivery)   Postpartum anemia  Additional problems: None     Discharge diagnosis: Term Pregnancy Delivered, Preeclampsia (severe) and GDM A2                                                                         Post partum procedures:Magnesium sulfate eclampsia prophylaxis  Augmentation: AROM, Pitocin, Cytotec and Foley Balloon  Complications: None  Hospital course:  Induction of Labor With Vaginal Delivery   21 y.o. yo G2P1001 at 318w0das admitted to the hospital 05/30/2017 for induction of labor.  Indication for induction: Preeclampsia.  Patient had an uncomplicated labor course as follows: Membrane Rupture Time/Date: 12:56 AM ,06/01/2017   Intrapartum Procedures: Episiotomy: None [1]                                         Lacerations:  1st degree [2]  Patient had delivery of a Viable infant.  Information for the patient's newborn:  AtJaz, [place]0[[id number]]Delivery Method: Vag-Spont  06/01/2017  Details of delivery can be found in separate delivery note.  Patient was on magnesium sulfate eclampsia prophylaxis as per protocol. Was started on Amlodipine 10 mg daily for BP control.  Stable CBGs.  Otherwise, she had a routine postpartum course. Patient is discharged home 06/03/17.  Physical exam  Vitals:   06/02/17 2033 06/03/17 0003 06/03/17 0338 06/03/17 0831  BP: (!) 122/91 139/80 135/75 133/83  Pulse: 78 78 85 83  Resp: 18 17 18 18    Temp: 98.3 F (36.8 C)  98.8 F (37.1 C) 98.2 F (36.8 C)  TempSrc: Oral  Oral Oral  SpO2: 100% 100% 100% 100%  Weight:      Height:       General: alert, cooperative and no distress Lochia: appropriate Uterine Fundus: firm Incision: N/A DVT Evaluation: No evidence of DVT seen on physical exam. Negative Homan's sign. No cords or calf tenderness. Labs: Lab Results  Component Value Date   WBC 15.4 (H) 06/02/2017   HGB 9.2 (L) 06/02/2017   HCT 26.3 (L) 06/02/2017   MCV 77.8 (L) 06/02/2017   PLT 204 06/02/2017   CMP Latest Ref Rng & Units 05/31/2017  Glucose 65 - 99 mg/dL 79  BUN 6 - 20 mg/dL 8  Creatinine 0.44 - 1.00 mg/dL 0.65  Sodium 135 - 145 mmol/L 136  Potassium 3.5 - 5.1 mmol/L 3.5  Chloride 101 - 111 mmol/L 105  CO2 22 - 32 mmol/L 23  Calcium 8.9 - 10.3 mg/dL 8.8(L)  Total Protein 6.5 - 8.1 g/dL 6.9  Total Bilirubin 0.3 - 1.2 mg/dL 1.2  Alkaline Phos 38 -  126 U/L 201(H)  AST 15 - 41 U/L 29  ALT 14 - 54 U/L 20    Discharge instruction: per After Visit Summary and "Baby and Me Booklet".  After visit meds:  Allergies as of 06/03/2017   No Known Allergies     Medication List    STOP taking these medications   ACCU-CHEK FASTCLIX LANCETS Misc   ACCU-CHEK GUIDE w/Device Kit   butalbital-acetaminophen-caffeine 50-325-40 MG tablet Commonly known as:  FIORICET, ESGIC   CITRANATAL BLOOM 90-1 MG Tabs   COMFORT FIT MATERNITY SUPP LG Misc   glucose blood test strip Commonly known as:  ACCU-CHEK GUIDE     TAKE these medications   albuterol 108 (90 Base) MCG/ACT inhaler Commonly known as:  PROVENTIL HFA;VENTOLIN HFA Inhale 1 puff into the lungs every 6 (six) hours as needed for wheezing or shortness of breath (seasonal allergies).   amLODipine 10 MG tablet Commonly known as:  NORVASC Take 1 tablet (10 mg total) by mouth daily.   ferrous sulfate 325 (65 FE) MG tablet Take 1 tablet (325 mg total) by mouth 2 (two) times daily with a meal.   ibuprofen 600  MG tablet Commonly known as:  ADVIL,MOTRIN Take 1 tablet (600 mg total) by mouth every 6 (six) hours.   PRENATE PIXIE 10-0.6-0.4-200 MG Caps Take 1 tablet by mouth daily.            Discharge Care Instructions        Start     Ordered   06/03/17 0000  ferrous sulfate 325 (65 FE) MG tablet  2 times daily with meals     06/03/17 0852   06/03/17 0000  ibuprofen (ADVIL,MOTRIN) 600 MG tablet  Every 6 hours     06/03/17 0852   06/03/17 0000  amLODipine (NORVASC) 10 MG tablet  Daily     06/03/17 0852      Diet: routine diet  Activity: Advance as tolerated. Pelvic rest for 6 weeks.   Outpatient follow up: Needs 1 week BP check (may need additional visits after)  Needs appt at 6 weeks for PP check and 2 hr GTT (postpartum)  Follow up Appt: Future Appointments Date Time Provider [place]  07/15/2017 8:15 AM [facility] None  07/15/2017 8:45 AM Constant, Vickii Chafe, MD [place] None   Follow up Visit:No Follow-up on file.  Postpartum contraception: Undecided  Newborn Data: Live born female  Birth Weight: 6 lb 1.9 oz (2775 g) APGAR: 7, 9  Baby Feeding: Breast Disposition:home with mother   06/03/2017 Verita Schneiders, MD

## 2017-06-03 NOTE — Lactation Note (Addendum)
This note was copied from a baby's chart. Lactation Consultation Note  Patient Name: Marie Cabrera RUEAV'W Date: 06/03/2017 Reason for consult: Initial assessment;Infant < 6lbs;1st time breastfeeding   Follow up with mom of 41 hour old infant. Infant was asleep in dad's arms. Mom reports she has not been pumping as it took too long. She reports infant is latching now so she plans to latch infant. Advised mom to latch infant to breast with each feeding and then follow with supplementation of EBM or formula. Advised mom to pump every 2-3 hours post BF to offer supplement and to prevent engorgement. Mom is a [facility] client and reports she has an appt soon. She has not spoken to [facility] this hospital stay. Mom declined [facility] loaner pump. Was shown how to pump with single and double manual pump. Mom plans to call WIC. Pump parts were packed up and enc mom to take with her.   Mom reports she is latching infant and it is painful at time. Attempted to latch infant to the breast before d/c. Infant was sleepy and was not interested in latching. Enc mom to use good head and pillow support with latch. Mom with soft compressible breasts and areola with small short shaft everted nipples. Worked with mom on positioning and basics.   Reviewed I/O, Engorgement prevention/treatment and breast milk handling and storage. Offered mom OP appt and she declined. She has LC phone # to call for questions/concerns or to make OP appt. Mom is aware of Support Groups. Mom was informed WIC is a BF resource for her also. Mom without further questions/concerns at this time.    Maternal Data Formula Feeding for Exclusion: No Has patient been taught Hand Expression?: Yes Does the patient have breastfeeding experience prior to this delivery?: No  Feeding Feeding Type: Breast Fed  LATCH Score Latch: Too sleepy or reluctant, no latch achieved, no sucking elicited.  Audible Swallowing: None  Type of Nipple: Everted at rest and after  stimulation  Comfort (Breast/Nipple): Soft / non-tender  Hold (Positioning): Assistance needed to correctly position infant at breast and maintain latch.  LATCH Score: 5  Interventions Interventions: Breast feeding basics reviewed;Support pillows;Assisted with latch;Position options;Skin to skin  Lactation Tools Discussed/Used WIC Program: Yes Pump Review: Setup, frequency, and cleaning;Milk Storage Initiated by:: reviewed and encouraged every 3 hours   Consult Status Consult Status: Complete Follow-up type: Call as needed    Ed Blalock 06/03/2017, 11:21 AM

## 2017-06-03 NOTE — Discharge Instructions (Signed)
Vaginal Delivery, Care After °Refer to this sheet in the next few weeks. These instructions provide you with information about caring for yourself after vaginal delivery. Your health care provider may also give you more specific instructions. Your treatment has been planned according to current medical practices, but problems sometimes occur. Call your health care provider if you have any problems or questions. °What can I expect after the procedure? °After vaginal delivery, it is common to have: °· Some bleeding from your vagina. °· Soreness in your abdomen, your vagina, and the area of skin between your vaginal opening and your anus (perineum). °· Pelvic cramps. °· Fatigue. ° °Follow these instructions at home: °Medicines °· Take over-the-counter and prescription medicines only as told by your health care provider. °· If you were prescribed an antibiotic medicine, take it as told by your health care provider. Do not stop taking the antibiotic until it is finished. °Driving ° °· Do not drive or operate heavy machinery while taking prescription pain medicine. °· Do not drive for 24 hours if you received a sedative. °Lifestyle °· Do not drink alcohol. This is especially important if you are breastfeeding or taking medicine to relieve pain. °· Do not use tobacco products, including cigarettes, chewing tobacco, or e-cigarettes. If you need help quitting, ask your health care provider. °Eating and drinking °· Drink at least 8 eight-ounce glasses of water every day unless you are told not to by your health care provider. If you choose to breastfeed your baby, you may need to drink more water than this. °· Eat high-fiber foods every day. These foods may help prevent or relieve constipation. High-fiber foods include: °? Whole grain cereals and breads. °? Brown rice. °? Beans. °? Fresh fruits and vegetables. °Activity °· Return to your normal activities as told by your health care provider. Ask your health care provider  what activities are safe for you. °· Rest as much as possible. Try to rest or take a nap when your baby is sleeping. °· Do not lift anything that is heavier than your baby or 10 lb (4.5 kg) until your health care provider says that it is safe. °· Talk with your health care provider about when you can engage in sexual activity. This may depend on your: °? Risk of infection. °? Rate of healing. °? Comfort and desire to engage in sexual activity. °Vaginal Care °· If you have an episiotomy or a vaginal tear, check the area every day for signs of infection. Check for: °? More redness, swelling, or pain. °? More fluid or blood. °? Warmth. °? Pus or a bad smell. °· Do not use tampons or douches until your health care provider says this is safe. °· Watch for any blood clots that may pass from your vagina. These may look like clumps of dark red, brown, or black discharge. °General instructions °· Keep your perineum clean and dry as told by your health care provider. °· Wear loose, comfortable clothing. °· Wipe from front to back when you use the toilet. °· Ask your health care provider if you can shower or take a bath. If you had an episiotomy or a perineal tear during labor and delivery, your health care provider may tell you not to take baths for a certain length of time. °· Wear a bra that supports your breasts and fits you well. °· If possible, have someone help you with household activities and help care for your baby for at least a few days after   you leave the hospital. °· Keep all follow-up visits for you and your baby as told by your health care provider. This is important. °Contact a health care provider if: °· You have: °? Vaginal discharge that has a bad smell. °? Difficulty urinating. °? Pain when urinating. °? A sudden increase or decrease in the frequency of your bowel movements. °? More redness, swelling, or pain around your episiotomy or vaginal tear. °? More fluid or blood coming from your episiotomy or  vaginal tear. °? Pus or a bad smell coming from your episiotomy or vaginal tear. °? A fever. °? A rash. °? Little or no interest in activities you used to enjoy. °? Questions about caring for yourself or your baby. °· Your episiotomy or vaginal tear feels warm to the touch. °· Your episiotomy or vaginal tear is separating or does not appear to be healing. °· Your breasts are painful, hard, or turn red. °· You feel unusually sad or worried. °· You feel nauseous or you vomit. °· You pass large blood clots from your vagina. If you pass a blood clot from your vagina, save it to show to your health care provider. Do not flush blood clots down the toilet without having your health care provider look at them. °· You urinate more than usual. °· You are dizzy or light-headed. °· You have not breastfed at all and you have not had a menstrual period for 12 weeks after delivery. °· You have stopped breastfeeding and you have not had a menstrual period for 12 weeks after you stopped breastfeeding. °Get help right away if: °· You have: °? Pain that does not go away or does not get better with medicine. °? Chest pain. °? Difficulty breathing. °? Blurred vision or spots in your vision. °? Thoughts about hurting yourself or your baby. °· You develop pain in your abdomen or in one of your legs. °· You develop a severe headache. °· You faint. °· You bleed from your vagina so much that you fill two sanitary pads in one hour. °This information is not intended to replace advice given to you by your health care provider. Make sure you discuss any questions you have with your health care provider. °Document Released: 08/24/2000 Document Revised: 02/08/2016 Document Reviewed: 09/11/2015 °Elsevier Interactive Patient Education © 2017 [employer] ° ° ° °Preeclampsia and Eclampsia °Preeclampsia is a serious condition that develops only during pregnancy. It is also called toxemia of pregnancy. This condition causes high blood pressure along  with other symptoms, such as swelling and headaches. These symptoms may develop as the condition gets worse. Preeclampsia may occur at 20 weeks of pregnancy or later. °Diagnosing and treating preeclampsia early is very important. If not treated early, it can cause serious problems for you and your baby. One problem it can lead to is eclampsia, which is a condition that causes muscle jerking or shaking (convulsions or seizures) in the mother. Delivering your baby is the best treatment for preeclampsia or eclampsia. Preeclampsia and eclampsia symptoms usually go away after your baby is born. °What are the causes? °The cause of preeclampsia is not known. °What increases the risk? °The following risk factors make you more likely to develop preeclampsia: °· Being pregnant for the first time. °· Having had preeclampsia during a past pregnancy. °· Having a family history of preeclampsia. °· Having high blood pressure. °· Being pregnant with twins or triplets. °· Being 35 or older. °· Being African-American. °· Having kidney disease or diabetes. °·   Having medical conditions such as lupus or blood diseases. °· Being very overweight (obese). ° °What are the signs or symptoms? °The earliest signs of preeclampsia are: °· High blood pressure. °· Increased protein in your urine. Your health care provider will check for this at every visit before you give birth (prenatal visit). ° °Other symptoms that may develop as the condition gets worse include: °· Severe headaches. °· Sudden weight gain. °· Swelling of the hands, face, legs, and feet. °· Nausea and vomiting. °· Vision problems, such as blurred or double vision. °· Numbness in the face, arms, legs, and feet. °· Urinating less than usual. °· Dizziness. °· Slurred speech. °· Abdominal pain, especially upper abdominal pain. °· Convulsions or seizures. ° °Symptoms generally go away after giving birth. °How is this diagnosed? °There are no screening tests for preeclampsia. Your  health care provider will ask you about symptoms and check for signs of preeclampsia during your prenatal visits. You may also have tests that include: °· Urine tests. °· Blood tests. °· Checking your blood pressure. °· Monitoring your baby’s heart rate. °· Ultrasound. ° °How is this treated? °You and your health care provider will determine the treatment approach that is best for you. Treatment may include: °· Having more frequent prenatal exams to check for signs of preeclampsia, if you have an increased risk for preeclampsia. °· Bed rest. °· Reducing how much salt (sodium) you eat. °· Medicine to lower your blood pressure. °· Staying in the hospital, if your condition is severe. There, treatment will focus on controlling your blood pressure and the amount of fluids in your body (fluid retention). °· You may need to take medicine (magnesium sulfate) to prevent seizures. This medicine may be given as an injection or through an IV tube. °· Delivering your baby early, if your condition gets worse. You may have your labor started with medicine (induced), or you may have a cesarean delivery. ° °Follow these instructions at home: °Eating and drinking ° °· Drink enough fluid to keep your urine clear or pale yellow. °· Eat a healthy diet that is low in sodium. Do not add salt to your food. Check nutrition labels to see how much sodium a food or beverage contains. °· Avoid caffeine. °Lifestyle °· Do not use any products that contain nicotine or tobacco, such as cigarettes and e-cigarettes. If you need help quitting, ask your health care provider. °· Do not use alcohol or drugs. °· Avoid stress as much as possible. Rest and get plenty of sleep. °General instructions °· Take over-the-counter and prescription medicines only as told by your health care provider. °· When lying down, lie on your side. This keeps pressure off of your baby. °· When sitting or lying down, raise (elevate) your feet. Try putting some pillows  underneath your lower legs. °· Exercise regularly. Ask your health care provider what kinds of exercise are best for you. °· Keep all follow-up and prenatal visits as told by your health care provider. This is important. °How is this prevented? °To prevent preeclampsia or eclampsia from developing during another pregnancy: °· Get proper medical care during pregnancy. Your health care provider may be able to prevent preeclampsia or diagnose and treat it early. °· Your health care provider may have you take a low-dose aspirin or a calcium supplement during your next pregnancy. °· You may have tests of your blood pressure and kidney function after giving birth. °· Maintain a healthy weight. Ask your health care provider for   help managing weight gain during pregnancy. °· Work with your health care provider to manage any long-term (chronic) health conditions you have, such as diabetes or kidney problems. ° °Contact a health care provider if: °· You gain more weight than expected. °· You have headaches. °· You have nausea or vomiting. °· You have abdominal pain. °· You feel dizzy or light-headed. °Get help right away if: °· You develop sudden or severe swelling anywhere in your body. This usually happens in the legs. °· You gain 5 lbs (2.3 kg) or more during one week. °· You have severe: °? Abdominal pain. °? Headaches. °? Dizziness. °? Vision problems. °? Confusion. °? Nausea or vomiting. °· You have a seizure. °· You have trouble moving any part of your body. °· You develop numbness in any part of your body. °· You have trouble speaking. °· You have any abnormal bleeding. °· You pass out. °This information is not intended to replace advice given to you by your health care provider. Make sure you discuss any questions you have with your health care provider. °Document Released: 08/24/2000 Document Revised: 04/24/2016 Document Reviewed: 04/02/2016 °Elsevier Interactive Patient Education © 2018 Elsevier Inc. ° °

## 2017-06-04 ENCOUNTER — Encounter: Payer: Medicaid Other | Admitting: Obstetrics and Gynecology

## 2017-06-06 ENCOUNTER — Ambulatory Visit: Payer: Medicaid Other

## 2017-06-06 VITALS — BP 134/88

## 2017-06-06 DIAGNOSIS — O1414 Severe pre-eclampsia complicating childbirth: Secondary | ICD-10-CM

## 2017-06-06 NOTE — Progress Notes (Signed)
Subjective:  Marie Cabrera is a 21 y.o. female with hypertension. Current Outpatient Prescriptions  Medication Sig Dispense Refill  . amLODipine (NORVASC) 10 MG tablet Take 1 tablet (10 mg total) by mouth daily. 30 tablet 2  . ferrous sulfate 325 (65 FE) MG tablet Take 1 tablet (325 mg total) by mouth 2 (two) times daily with a meal. 60 tablet 3  . ibuprofen (ADVIL,MOTRIN) 600 MG tablet Take 1 tablet (600 mg total) by mouth every 6 (six) hours. 30 tablet 0   No current facility-administered medications for this visit.     Hypertension ROS: taking medications as instructed, no medication side effects noted, no TIA's, no chest pain on exertion, no dyspnea on exertion and no swelling of ankles.  New concerns: None per pt.   Objective:  BP 134/88   Appearance alert, well appearing, and in no distress. General exam BP noted to be well controlled today in office.    Assessment:   Hypertension well controlled.   Plan:  Current treatment plan is effective, no change in therapy.  Keep upcoming PP visit

## 2017-07-15 ENCOUNTER — Encounter: Payer: Self-pay | Admitting: Obstetrics and Gynecology

## 2017-07-15 ENCOUNTER — Other Ambulatory Visit: Payer: Medicaid Other

## 2017-07-15 ENCOUNTER — Ambulatory Visit (INDEPENDENT_AMBULATORY_CARE_PROVIDER_SITE_OTHER): Payer: Medicaid Other | Admitting: Obstetrics and Gynecology

## 2017-07-15 DIAGNOSIS — Z1389 Encounter for screening for other disorder: Secondary | ICD-10-CM

## 2017-07-15 MED ORDER — AMLODIPINE BESYLATE 10 MG PO TABS: 10.0000 mg | ORAL_TABLET | Freq: Every day | ORAL | 2 refills | Status: DC

## 2017-07-15 MED ORDER — MEDROXYPROGESTERONE ACETATE 150 MG/ML IM SUSP: 150.0000 mg | INTRAMUSCULAR | 5 refills | Status: DC

## 2017-07-15 NOTE — Progress Notes (Signed)
Post Partum Exam  Marie Cabrera is a 21 y.o. [id number] female who presents for a postpartum visit. She is 6 weeks postpartum following a spontaneous vaginal delivery. I have fully reviewed the prenatal and intrapartum course. [facility]redacted[facility]  Anesthesia: epidural. Postpartum course has been unremarkable. Baby's course has been unremarkable. Baby is feeding by both breast and bottle - Similac Alimentum. Bleeding no bleeding. Bowel function is normal. Bladder function is normal. Patient is sexually active. Contraception method is condoms. Postpartum depression screening:neg. Pt wants to start DEPO. Pt did not take BP meds today.      Review of Systems Pertinent items are noted in HPI.    Objective:  unknown if currently breastfeeding.  General:  alert, cooperative and no distress   Breasts:  inspection negative, no nipple discharge or bleeding, no masses or nodularity palpable  Lungs: clear to auscultation bilaterally  Heart:  regular rate and rhythm  Abdomen: soft, non-tender; bowel sounds normal; no masses,  no organomegaly   Vulva:  normal  Vagina: normal vagina, no discharge, exudate, lesion, or erythema  Cervix:  multiparous appearance  Corpus: normal size, contour, position, consistency, mobility, non-tender  Adnexa:  no mass, fullness, tenderness  Rectal Exam: Not performed.        Assessment:    Normal postpartum exam. Pap smear 11/12/16 NORMAL.   Plan:   1. Contraception: condoms. Patient will return in 2 weeks for depo-provera 2. Patient is medically cleared to resume all activities of daily living. Glucola today due to GDM. Patient had a episode of emesis and thus will return for repeat testing.  Patient with persistent HTN. Continue Norvasc (refill provided) and follow up with PCP 3. Follow up in: 2 weeks for depo-provera and for annual exam in march 2019 or as needed.

## 2017-07-22 ENCOUNTER — Other Ambulatory Visit: Payer: Medicaid Other

## 2017-07-29 ENCOUNTER — Ambulatory Visit: Payer: Medicaid Other

## 2017-07-30 ENCOUNTER — Telehealth: Payer: Self-pay

## 2017-07-30 ENCOUNTER — Telehealth: Payer: Self-pay | Admitting: *Deleted

## 2017-07-30 NOTE — Telephone Encounter (Signed)
Lft msg with Mother to have patient call us to find out plans or reschedule.

## 2017-07-30 NOTE — Telephone Encounter (Signed)
TC from patient requesting letter stating she is cleared to return to work. Per Dr.Constant pt cleared at Post Partum Visit on 07-15-17. Letter written and placed at front desk for pick up.

## 2017-08-06 ENCOUNTER — Other Ambulatory Visit: Payer: Medicaid Other

## 2017-10-03 ENCOUNTER — Ambulatory Visit: Payer: Medicaid Other | Admitting: Obstetrics & Gynecology

## 2018-03-10 ENCOUNTER — Other Ambulatory Visit: Payer: Self-pay

## 2018-03-10 ENCOUNTER — Ambulatory Visit (HOSPITAL_COMMUNITY)
Admission: EM | Admit: 2018-03-10 | Discharge: 2018-03-10 | Disposition: A | Discharge status: Home / Self Care | Payer: Medicaid Other | Attending: Family Medicine | Admitting: Family Medicine

## 2018-03-10 ENCOUNTER — Encounter (HOSPITAL_COMMUNITY): Payer: Self-pay | Admitting: Emergency Medicine

## 2018-03-10 DIAGNOSIS — J45909 Unspecified asthma, uncomplicated: Secondary | ICD-10-CM | POA: Diagnosis not present

## 2018-03-10 DIAGNOSIS — Z3202 Encounter for pregnancy test, result negative: Secondary | ICD-10-CM | POA: Diagnosis not present

## 2018-03-10 DIAGNOSIS — R1084 Generalized abdominal pain: Secondary | ICD-10-CM

## 2018-03-10 DIAGNOSIS — R110 Nausea: Secondary | ICD-10-CM | POA: Diagnosis present

## 2018-03-10 DIAGNOSIS — R109 Unspecified abdominal pain: Secondary | ICD-10-CM | POA: Diagnosis present

## 2018-03-10 DIAGNOSIS — F909 Attention-deficit hyperactivity disorder, unspecified type: Secondary | ICD-10-CM | POA: Diagnosis not present

## 2018-03-10 LAB — COMPREHENSIVE METABOLIC PANEL
ALBUMIN: 3.9 g/dL (ref 3.5–5.0)
ALK PHOS: 86 U/L (ref 38–126)
ALT: 19 U/L (ref 0–44)
AST: 25 U/L (ref 15–41)
Anion gap: 7 (ref 5–15)
BUN: 12 mg/dL (ref 6–20)
CALCIUM: 9.2 mg/dL (ref 8.9–10.3)
CHLORIDE: 109 mmol/L (ref 98–111)
CO2: 26 mmol/L (ref 22–32)
CREATININE: 0.68 mg/dL (ref 0.44–1.00)
GFR calc Af Amer: >60 mL/min (ref >60)
GFR calc non Af Amer: >60 mL/min (ref >60)
GLUCOSE: 80 mg/dL (ref 70–99)
Potassium: 4.1 mmol/L (ref 3.5–5.1)
Sodium: 142 mmol/L (ref 135–145)
Total Bilirubin: 0.6 mg/dL (ref 0.3–1.2)
Total Protein: 7.2 g/dL (ref 6.5–8.1)

## 2018-03-10 LAB — CBC WITH DIFFERENTIAL/PLATELET
ABS IMMATURE GRANULOCYTES: 0 10*3/uL (ref 0.0–0.1)
BASOS PCT: 1 %
Basophils Absolute: 0 10*3/uL (ref 0.0–0.1)
Eosinophils Absolute: 0.2 10*3/uL (ref 0.0–0.7)
Eosinophils Relative: 3 %
HCT: 40.2 % (ref 36.0–46.0)
HEMOGLOBIN: 12.8 g/dL (ref 12.0–15.0)
IMMATURE GRANULOCYTES: 0 %
LYMPHS PCT: 43 %
Lymphs Abs: 2.3 10*3/uL (ref 0.7–4.0)
MCH: 26.8 pg (ref 26.0–34.0)
MCHC: 31.8 g/dL (ref 30.0–36.0)
MCV: 84.1 fL (ref 78.0–100.0)
Monocytes Absolute: 0.4 10*3/uL (ref 0.1–1.0)
Monocytes Relative: 7 %
NEUTROS ABS: 2.5 10*3/uL (ref 1.7–7.7)
NEUTROS PCT: 46 %
PLATELETS: 215 10*3/uL (ref 150–400)
RBC: 4.78 MIL/uL (ref 3.87–5.11)
RDW: 14.6 % (ref 11.5–15.5)
WBC: 5.4 10*3/uL (ref 4.0–10.5)

## 2018-03-10 LAB — POCT URINALYSIS DIP (DEVICE)
Bilirubin Urine: NEGATIVE
Glucose, UA: NEGATIVE mg/dL
KETONES UR: NEGATIVE mg/dL
Leukocytes, UA: NEGATIVE
Nitrite: NEGATIVE
PROTEIN: NEGATIVE mg/dL
Specific Gravity, Urine: >=1.03 (ref 1.005–1.030)
UROBILINOGEN UA: 0.2 mg/dL (ref 0.0–1.0)
pH: 5.5 (ref 5.0–8.0)

## 2018-03-10 LAB — POCT PREGNANCY, URINE: Preg Test, Ur: NEGATIVE

## 2018-03-10 LAB — LIPASE, BLOOD: Lipase: 28 U/L (ref 11–51)

## 2018-03-10 MED ORDER — GI COCKTAIL ~~LOC~~
ORAL | Status: AC
  Filled 2018-03-10: qty 30

## 2018-03-10 MED ORDER — IBUPROFEN 400 MG PO TABS: 400.0000 mg | ORAL_TABLET | Freq: Four times a day (QID) | ORAL | 0 refills | Status: DC | PRN

## 2018-03-10 MED ORDER — GI COCKTAIL ~~LOC~~
30.0000 mL | Freq: Once | ORAL | Status: AC
  Administered 2018-03-10: 30 mL via ORAL

## 2018-03-10 MED ORDER — KETOROLAC TROMETHAMINE 60 MG/2ML IM SOLN
INTRAMUSCULAR | Status: AC
  Filled 2018-03-10: qty 2

## 2018-03-10 MED ORDER — KETOROLAC TROMETHAMINE 60 MG/2ML IM SOLN
60.0000 mg | Freq: Once | INTRAMUSCULAR | Status: AC
Start: 2018-03-10 — End: 2018-03-10
  Administered 2018-03-10: 60 mg via INTRAMUSCULAR

## 2018-03-10 MED ORDER — OMEPRAZOLE 20 MG PO CPDR: 20.0000 mg | DELAYED_RELEASE_CAPSULE | Freq: Every day | ORAL | 0 refills | Status: DC

## 2018-03-10 NOTE — ED Notes (Signed)
Sent for a dirty and clean urine with instructions.

## 2018-03-10 NOTE — Discharge Instructions (Signed)
Will call you with any positive findings with your labs which are pending.  Daily omeprazole.  Plenty of water and fiber in diet to ensure no constipation. May use miralax as needed if constipation.  Ibuprofen as needed for cramping pain.  Please follow up with gynecology and/or your primary care provider if symptoms persist. If worsening of pain, fevers, vomiting, or otherwise worsening please go to Er.

## 2018-03-10 NOTE — ED Provider Notes (Signed)
[facility]    CSN: [id number] Arrival date & time: 03/10/18  1410     History   Chief Complaint Chief Complaint  Patient presents with  . Abdominal Pain    HPI Marie Cabrera is a 22 y.o. female.   Marie Cabrera presents with complaints of abdominal pain which is sharp and stabbing, across entire abdomen, comes and goes for the past three days. No exacerbating factors. Has been eating and drinking. Normal urination. Last BM today and was normal, denies constipation. Some vaginal spotting today. Has not had a period in approximately 6 months. States prior to her son- born in September- she had gone years without a period. No other vaginal symptoms. She is not breastfeeding. Not on birth control. No back pain. Has had some nausea with the pain but no vomiting. No diarrhea or fevers. Does not follow with a PCP or gynecologist. Hx of adhd, amenorrhea, asthma, substance abuse.     ROS per HPI.      Past Medical History:  Diagnosis Date  . ADHD (attention deficit hyperactivity disorder)   . Amenorrhea    last menses 2007  . Asthma    last ospitalized >4 years ago.  NO recent problesm  . Heart murmur    [facility].  MD 2 d Echo [id number]  . Rectal bleeding     Patient Active Problem List   Diagnosis Date Noted  . Postpartum anemia 06/02/2017  . NSVD (normal spontaneous vaginal delivery) 06/01/2017  . Hypertension in pregnancy, preeclampsia, severe, delivered 05/30/2017  . GDM (gestational diabetes mellitus) 03/29/2017  . Anemia affecting pregnancy in third trimester 02/27/2017  . Limited prenatal care in second trimester 02/25/2017  . Mild tetrahydrocannabinol (THC) abuse 11/19/2016  . Alcohol-induced mood disorder (HCC) 01/08/2016  . Substance abuse (HCC)   . Oppositional defiant disorder 05/22/2012  . Hematochezia     Past Surgical History:  Procedure Laterality Date  . COLONOSCOPY  03/14/2012   Procedure: COLONOSCOPY;  Surgeon: Jon Gills, MD;   Location: [facility] OR;  Service: Gastroenterology;  Laterality: N/A;  . NO PAST SURGERIES    . No surgical history      OB History    Gravida  1   Para  1   Term  1   Preterm      AB      Living  1     SAB      TAB      Ectopic      Multiple      Live Births  1            Home Medications    Prior to Admission medications   Medication Sig Start Date End Date Taking? Authorizing Provider  ibuprofen (ADVIL,MOTRIN) 400 MG tablet Take 1 tablet (400 mg total) by mouth every 6 (six) hours as needed. 03/10/18   Georgetta Haber, NP  omeprazole (PRILOSEC) 20 MG capsule Take 1 capsule (20 mg total) by mouth daily. 03/10/18   Georgetta Haber, NP    Family History Family History  Adopted: Yes  Problem Relation Age of Onset  . Crohn's disease Maternal Aunt   . Colon polyps Maternal Aunt   . Colon cancer Maternal Grandfather   . Alcohol abuse Mother   . Depression Mother   . Drug abuse Mother   . Mental illness Mother   . Alcohol abuse Father   . Drug abuse Father   . Mental illness Sister   .  Alcohol abuse Maternal Grandmother   . Depression Maternal Grandmother   . Diabetes Maternal Grandmother   . Kidney disease Maternal Grandmother   . Other Unknown        Pt is adopted, only knows about maternal family    Social History Social History   Tobacco Use  . Smoking status: Never Smoker  . Smokeless tobacco: Never Used  Substance Use Topics  . Alcohol use: No  . Drug use: No    Types: Marijuana    Comment: last use June 2018     Allergies   Patient has no known allergies.   Review of Systems Review of Systems   Physical Exam Triage Vital Signs ED Triage Vitals  Enc Vitals Group     BP 03/10/18 1457 130/82     Pulse Rate 03/10/18 1457 72     Resp 03/10/18 1457 18     Temp 03/10/18 1457 98.2 F (36.8 C)     Temp Source 03/10/18 1457 Temporal     SpO2 03/10/18 1457 100 %     Weight --      Height --      Head Circumference --      Peak Flow --       Pain Score 03/10/18 1454 7     Pain Loc --      Pain Edu? --      Excl. in GC? --    No data found.  Updated Vital Signs BP 130/82 (BP Location: Right Arm)   Pulse 72   Temp 98.2 F (36.8 C) (Temporal)   Resp 18   SpO2 100%    Physical Exam  Constitutional: She is oriented to person, place, and time. She appears well-developed and well-nourished. No distress.  Cardiovascular: Normal rate, regular rhythm and normal heart sounds.  Pulmonary/Chest: Effort normal and breath sounds normal.  Abdominal: Soft. Normal appearance. Bowel sounds are decreased. There is tenderness in the epigastric area and periumbilical area. There is no rigidity, no rebound, no guarding, no CVA tenderness, no tenderness at McBurney's point and negative Murphy's sign.  Neurological: She is alert and oriented to person, place, and time.  Skin: Skin is warm and dry.     UC Treatments / Results  Labs (all labs ordered are listed, but only abnormal results are displayed) Labs Reviewed  POCT URINALYSIS DIP (DEVICE) - Abnormal; Notable for the following components:      Result Value   Hgb urine dipstick MODERATE (*)    All other components within normal limits  CBC WITH DIFFERENTIAL/PLATELET  COMPREHENSIVE METABOLIC PANEL  LIPASE, BLOOD  POCT PREGNANCY, URINE  URINE CYTOLOGY ANCILLARY ONLY    EKG None  Radiology No results found.  Procedures Procedures (including critical care time)  Medications Ordered in UC Medications  ketorolac (TORADOL) injection 60 mg (has no administration in time range)  gi cocktail (Maalox,Lidocaine,Donnatal) (has no administration in time range)    Initial Impression / Assessment and Plan / UC Course  I have reviewed the triage vital signs and the nursing notes.  Pertinent labs & imaging results that were available during my care of the patient were reviewed by me and considered in my medical decision making (see chart for details).     ua without  indications of UTI, negative pregnancy. Onset of spotting, question pms pain? toradol provided. However, pain primarily epigastric on exam, gi cocktail provided and script for omeprazole. Labs collected, will call patient with any concerning results. Urine cytology  pending. Encouraged recheck with pcp and/or gyne. Return precautions provided. Patient verbalized understanding and agreeable to plan.   Final Clinical Impressions(s) / UC Diagnoses   Final diagnoses:  Generalized abdominal pain     Discharge Instructions     Will call you with any positive findings with your labs which are pending.  Daily omeprazole.  Plenty of water and fiber in diet to ensure no constipation. May use miralax as needed if constipation.  Ibuprofen as needed for cramping pain.  Please follow up with gynecology and/or your primary care provider if symptoms persist. If worsening of pain, fevers, vomiting, or otherwise worsening please go to Er.    ED Prescriptions    Medication Sig Dispense Auth. Provider   omeprazole (PRILOSEC) 20 MG capsule Take 1 capsule (20 mg total) by mouth daily. 30 capsule Linus Mako B, NP   ibuprofen (ADVIL,MOTRIN) 400 MG tablet Take 1 tablet (400 mg total) by mouth every 6 (six) hours as needed. 30 tablet Georgetta Haber, NP     Controlled Substance Prescriptions [place] Controlled Substance Registry consulted? Not Applicable   Georgetta Haber, NP 03/10/18 1537

## 2018-03-10 NOTE — ED Triage Notes (Signed)
Abdominal pain started 3 days ago.  No period in 6 months, spotting now.    Denies burning with urination.  Denies vaginal discharge,  Last bm was this am and normal per patient.  Pain is lower abdomen

## 2018-03-11 ENCOUNTER — Telehealth (HOSPITAL_COMMUNITY): Payer: Self-pay

## 2018-03-11 LAB — URINE CYTOLOGY ANCILLARY ONLY
Chlamydia: NEGATIVE
Neisseria Gonorrhea: NEGATIVE
TRICH (WINDOWPATH): NEGATIVE

## 2018-03-11 NOTE — Telephone Encounter (Signed)
Attempted to reach patient regarding normal labs. No answer at this time.

## 2018-03-12 LAB — URINE CYTOLOGY ANCILLARY ONLY: Candida vaginitis: NEGATIVE

## 2018-03-14 ENCOUNTER — Telehealth (HOSPITAL_COMMUNITY): Payer: Self-pay

## 2018-03-14 NOTE — Telephone Encounter (Signed)
Bacterial vaginosis is positive. This was not treated at the urgent care visit.  Attempted to reach patient to assess need for Flagyl. No answer at this time. 

## 2018-09-10 NOTE — L&D Delivery Note (Signed)
OB/GYN Faculty Practice Delivery Note  CERENITI CURB is a 23 y.o. G2P1001 s/p VD at [redacted]w[redacted]d. She was admitted for IOL for worsening cHTN.   ROM: 2h 86m with clear fluid GBS Status:  --Henderson Cloud (11/18 0450) Maximum Maternal Temperature: 98.80F  Labor Progress: . Initial SVE: 1.5/60/-3. Patient received Cytotec, Pitocin and AROM. Received epidural. She then progressed to complete.   Delivery Date/Time: 12/2 @ [id number] Delivery: Called to room and patient was complete and pushing. Head delivered in LOA position. No nuchal cord present. Shoulder and body delivered in usual fashion. Infant with spontaneous cry, placed on mother's abdomen, dried and stimulated. Cord clamped x 2 after 1-minute delay, and cut by [name]. Cord blood drawn. Placenta delivered spontaneously with gentle cord traction. Fundus firm with massage and Pitocin. Labia, perineum, vagina, and cervix inspected inspected with no lacerations.  Baby Weight: pending  Placenta: Sent to L&D Complications: None Lacerations: None EBL: 100 mL Analgesia: Epidural   Infant: APGAR (1 MIN): 8   APGAR (5 MINS): 9   APGAR (10 MINS):     Barrington Ellison, MD OB Family Medicine Fellow, [facility], [facility] 08/12/2019, 4:11 AM

## 2018-09-21 ENCOUNTER — Emergency Department (HOSPITAL_COMMUNITY)
Admission: EM | Admit: 2018-09-21 | Discharge: 2018-09-21 | Disposition: A | Discharge status: Home / Self Care | Payer: Medicaid Other | Attending: Emergency Medicine | Admitting: Emergency Medicine

## 2018-09-21 ENCOUNTER — Other Ambulatory Visit: Payer: Self-pay

## 2018-09-21 ENCOUNTER — Emergency Department (HOSPITAL_COMMUNITY): Payer: Medicaid Other

## 2018-09-21 ENCOUNTER — Encounter (HOSPITAL_COMMUNITY): Payer: Self-pay | Admitting: Emergency Medicine

## 2018-09-21 DIAGNOSIS — F909 Attention-deficit hyperactivity disorder, unspecified type: Secondary | ICD-10-CM | POA: Diagnosis not present

## 2018-09-21 DIAGNOSIS — J45909 Unspecified asthma, uncomplicated: Secondary | ICD-10-CM | POA: Diagnosis not present

## 2018-09-21 DIAGNOSIS — B349 Viral infection, unspecified: Secondary | ICD-10-CM

## 2018-09-21 DIAGNOSIS — F913 Oppositional defiant disorder: Secondary | ICD-10-CM | POA: Diagnosis not present

## 2018-09-21 DIAGNOSIS — Z79899 Other long term (current) drug therapy: Secondary | ICD-10-CM | POA: Diagnosis not present

## 2018-09-21 DIAGNOSIS — R1110 Vomiting, unspecified: Secondary | ICD-10-CM | POA: Diagnosis present

## 2018-09-21 LAB — CBC WITH DIFFERENTIAL/PLATELET
ABS IMMATURE GRANULOCYTES: 0.02 10*3/uL (ref 0.00–0.07)
Basophils Absolute: 0 10*3/uL (ref 0.0–0.1)
Basophils Relative: 0 %
EOS PCT: 2 %
Eosinophils Absolute: 0.1 10*3/uL (ref 0.0–0.5)
HCT: 40.9 % (ref 36.0–46.0)
HEMOGLOBIN: 12.9 g/dL (ref 12.0–15.0)
Immature Granulocytes: 0 %
LYMPHS ABS: 1.7 10*3/uL (ref 0.7–4.0)
LYMPHS PCT: 32 %
MCH: 26.5 pg (ref 26.0–34.0)
MCHC: 31.5 g/dL (ref 30.0–36.0)
MCV: 84.2 fL (ref 80.0–100.0)
MONOS PCT: 15 %
Monocytes Absolute: 0.8 10*3/uL (ref 0.1–1.0)
Neutro Abs: 2.6 10*3/uL (ref 1.7–7.7)
Neutrophils Relative %: 51 %
PLATELETS: 183 10*3/uL (ref 150–400)
RBC: 4.86 MIL/uL (ref 3.87–5.11)
RDW: 14.4 % (ref 11.5–15.5)
WBC: 5.2 10*3/uL (ref 4.0–10.5)
nRBC: 0 % (ref 0.0–0.2)

## 2018-09-21 LAB — COMPREHENSIVE METABOLIC PANEL
ALBUMIN: 3.7 g/dL (ref 3.5–5.0)
ALK PHOS: 81 U/L (ref 38–126)
ALT: 17 U/L (ref 0–44)
AST: 23 U/L (ref 15–41)
Anion gap: 8 (ref 5–15)
BUN: 9 mg/dL (ref 6–20)
CALCIUM: 8.3 mg/dL — AB (ref 8.9–10.3)
CHLORIDE: 108 mmol/L (ref 98–111)
CO2: 23 mmol/L (ref 22–32)
CREATININE: 0.7 mg/dL (ref 0.44–1.00)
GFR calc Af Amer: >60 mL/min (ref >60)
GFR calc non Af Amer: >60 mL/min (ref >60)
GLUCOSE: 82 mg/dL (ref 70–99)
Potassium: 3.5 mmol/L (ref 3.5–5.1)
SODIUM: 139 mmol/L (ref 135–145)
Total Bilirubin: 0.6 mg/dL (ref 0.3–1.2)
Total Protein: 6.7 g/dL (ref 6.5–8.1)

## 2018-09-21 LAB — HCG, SERUM, QUALITATIVE: PREG SERUM: NEGATIVE

## 2018-09-21 LAB — LIPASE, BLOOD: LIPASE: 25 U/L (ref 11–51)

## 2018-09-21 MED ORDER — ONDANSETRON 4 MG PO TBDP
4.0000 mg | ORAL_TABLET | Freq: Once | ORAL | Status: AC
  Administered 2018-09-21: 4 mg via ORAL
  Filled 2018-09-21: qty 1

## 2018-09-21 MED ORDER — KETOROLAC TROMETHAMINE 15 MG/ML IJ SOLN
15.0000 mg | Freq: Once | INTRAMUSCULAR | Status: AC
  Administered 2018-09-21: 15 mg via INTRAVENOUS
  Filled 2018-09-21: qty 1

## 2018-09-21 MED ORDER — METOCLOPRAMIDE HCL 5 MG/ML IJ SOLN
10.0000 mg | Freq: Once | INTRAMUSCULAR | Status: AC
Start: 2018-09-21 — End: 2018-09-21
  Administered 2018-09-21: 10 mg via INTRAVENOUS
  Filled 2018-09-21: qty 2

## 2018-09-21 MED ORDER — DIPHENHYDRAMINE HCL 50 MG/ML IJ SOLN
25.0000 mg | Freq: Once | INTRAMUSCULAR | Status: AC
  Administered 2018-09-21: 25 mg via INTRAVENOUS
  Filled 2018-09-21: qty 1

## 2018-09-21 NOTE — ED Triage Notes (Signed)
Patient c/o chills, cold like symptoms, emesis, diarrhea and cough. Patient states symptoms began about a week ago. Tried mucinx and theraflu at home. Also having migraines x 1 week.

## 2018-09-21 NOTE — ED Notes (Signed)
Pt stated she is feeling better and has not had any emesis episodes after drinking fluids

## 2018-09-21 NOTE — ED Notes (Signed)
ED Provider at bedside. 

## 2018-09-21 NOTE — ED Notes (Signed)
Patient transported to X-ray 

## 2018-09-21 NOTE — ED Notes (Signed)
Patient verbalizes understanding of discharge instructions. Opportunity for questioning and answers were provided. Armband removed by staff, pt discharged from ED ambulatory.   

## 2018-09-21 NOTE — ED Notes (Signed)
Pt observed to be ambulatory to the bathroom, steady gait and no difficulty noted.

## 2018-09-21 NOTE — ED Provider Notes (Signed)
[facility] EMERGENCY DEPARTMENT Provider Note   CSN: [id number] Arrival date & time: 09/21/18  1700     History   Chief Complaint Chief Complaint  Patient presents with  . Emesis  . Cough    HPI Marie Cabrera is a 23 y.o. female.  23 y.o female with a PMH of ADHD, Amenorrhea presents to the ED with a chief complaint of vomiting, migraines, body aches for 1 week.  She ports her body aches are worse in the morning along with multiple episodes of emesis and nausea.  She has tried TheraFlu, Aleve, Mucinex and reports no help with symptoms.  Reports a productive cough with green sputum.  Reports taking multiple pregnancy tests at home and having them all been negative, also endorses some chills.  She denies any chest pain, fever, shortness of breath, abdominal pain or urinary symptoms.  She denies any birth control at this time.     Past Medical History:  Diagnosis Date  . ADHD (attention deficit hyperactivity disorder)   . Amenorrhea    last menses 2007  . Asthma    last ospitalized >4 years ago.  NO recent problesm  . Heart murmur    [facility].  MD 2 d Echo 23012  . Rectal bleeding     Patient Active Problem List   Diagnosis Date Noted  . Postpartum anemia 06/02/2017  . NSVD (normal spontaneous vaginal delivery) 06/01/2017  . Hypertension in pregnancy, preeclampsia, severe, delivered 05/30/2017  . GDM (gestational diabetes mellitus) 03/29/2017  . Anemia affecting pregnancy in third trimester 02/27/2017  . Limited prenatal care in second trimester 02/25/2017  . Mild tetrahydrocannabinol (THC) abuse 11/19/2016  . Alcohol-induced mood disorder (HCC) 01/08/2016  . Substance abuse (HCC)   . Oppositional defiant disorder 05/22/2012  . Hematochezia     Past Surgical History:  Procedure Laterality Date  . COLONOSCOPY  03/14/2012   Procedure: COLONOSCOPY;  Surgeon: Jon Gills, MD;  Location: [facility] OR;  Service: Gastroenterology;   Laterality: N/A;  . NO PAST SURGERIES    . No surgical history       OB History    Gravida  1   Para  1   Term  1   Preterm      AB      Living  1     SAB      TAB      Ectopic      Multiple      Live Births  1            Home Medications    Prior to Admission medications   Medication Sig Start Date End Date Taking? Authorizing Provider  ibuprofen (ADVIL,MOTRIN) 400 MG tablet Take 1 tablet (400 mg total) by mouth every 6 (six) hours as needed. 03/10/18   Georgetta Haber, NP  omeprazole (PRILOSEC) 20 MG capsule Take 1 capsule (20 mg total) by mouth daily. 03/10/18   Georgetta Haber, NP    Family History Family History  Adopted: Yes  Problem Relation Age of Onset  . Crohn's disease Maternal Aunt   . Colon polyps Maternal Aunt   . Colon cancer Maternal Grandfather   . Alcohol abuse Mother   . Depression Mother   . Drug abuse Mother   . Mental illness Mother   . Alcohol abuse Father   . Drug abuse Father   . Mental illness Sister   . Alcohol abuse Maternal Grandmother   .  Depression Maternal Grandmother   . Diabetes Maternal Grandmother   . Kidney disease Maternal Grandmother   . Other Other        Pt is adopted, only knows about maternal family    Social History Social History   Tobacco Use  . Smoking status: Never Smoker  . Smokeless tobacco: Never Used  Substance Use Topics  . Alcohol use: No  . Drug use: No    Types: Marijuana    Comment: last use June 2018     Allergies   Patient has no known allergies.   Review of Systems Review of Systems  Constitutional: Negative for chills and fever.  HENT: Negative for ear pain and sore throat.   Eyes: Negative for pain and visual disturbance.  Respiratory: Negative for cough and shortness of breath.   Cardiovascular: Negative for chest pain and palpitations.  Gastrointestinal: Positive for abdominal pain, nausea and vomiting. Negative for diarrhea.  Genitourinary: Negative for dysuria and  hematuria.  Musculoskeletal: Positive for myalgias. Negative for arthralgias and back pain.  Skin: Negative for color change and rash.  Neurological: Negative for seizures and syncope.  All other systems reviewed and are negative.    Physical Exam Updated Vital Signs BP 138/88   Pulse 79   Temp 99.3 F (37.4 C) (Oral)   Resp 14   LMP 07/11/2018   SpO2 100%   Physical Exam Vitals signs and nursing note reviewed.  Constitutional:      General: She is not in acute distress.    Appearance: Normal appearance. She is well-developed. She is not ill-appearing.     Comments: Ill-appearing, nontoxic appearing.  HENT:     Head: Normocephalic and atraumatic.     Mouth/Throat:     Pharynx: No oropharyngeal exudate.  Eyes:     Pupils: Pupils are equal, round, and reactive to light.  Neck:     Musculoskeletal: Normal range of motion.  Cardiovascular:     Rate and Rhythm: Regular rhythm.     Heart sounds: Normal heart sounds.  Pulmonary:     Effort: Pulmonary effort is normal. No respiratory distress.     Breath sounds: Normal breath sounds.  Abdominal:     General: Bowel sounds are normal. There is no distension.     Palpations: Abdomen is soft.     Tenderness: There is abdominal tenderness.     Comments: Generalized.   Musculoskeletal:        General: No tenderness or deformity.     Right lower leg: No edema.     Left lower leg: No edema.  Skin:    General: Skin is warm and dry.  Neurological:     Mental Status: She is alert and oriented to person, place, and time.      ED Treatments / Results  Labs (all labs ordered are listed, but only abnormal results are displayed) Labs Reviewed  COMPREHENSIVE METABOLIC PANEL - Abnormal; Notable for the following components:      Result Value   Calcium 8.3 (*)    All other components within normal limits  HCG, SERUM, QUALITATIVE  CBC WITH DIFFERENTIAL/PLATELET  LIPASE, BLOOD    EKG None  Radiology Dg Chest 2  View  Result Date: 09/21/2018 CLINICAL DATA:  Cough, chills, and vomiting for 1 week. EXAM: CHEST - 2 VIEW COMPARISON:  None. FINDINGS: The heart size and mediastinal contours are within normal limits. Both lungs are clear. The visualized skeletal structures are unremarkable. IMPRESSION: Negative.  No  active cardiopulmonary disease. Electronically Signed   By: Myles RosenthalJohn  Stahl M.D.   On: 09/21/2018 18:49    Procedures Procedures (including critical care time)  Medications Ordered in ED Medications  ondansetron (ZOFRAN-ODT) disintegrating tablet 4 mg (4 mg Oral Given 09/21/18 1951)  ketorolac (TORADOL) 15 MG/ML injection 15 mg (15 mg Intravenous Given 09/21/18 2004)  metoCLOPramide (REGLAN) injection 10 mg (10 mg Intravenous Given 09/21/18 2003)  diphenhydrAMINE (BENADRYL) injection 25 mg (25 mg Intravenous Given 09/21/18 2004)     Initial Impression / Assessment and Plan / ED Course  I have reviewed the triage vital signs and the nursing notes.  Pertinent labs & imaging results that were available during my care of the patient were reviewed by me and considered in my medical decision making (see chart for details).    CBC showed no leukocytosis, hemoglobin is within normal limits.  CMP showed no electrolyte abnormality, LFTs are within normal limits.  hCG is negative at this time, lipase level is within normal limits.  DG chest 2 view showed no acute abnormality. She has had a cough for over a week.   8:51 PM Reports improvement of headache with HA cocktail, requesting discharge from the ED. Vitals signs are stable, patient stable for discharge.   Final Clinical Impressions(s) / ED Diagnoses   Final diagnoses:  Viral illness    ED Discharge Orders    None       Claude MangesSoto, Cassie Henkels, Cordelia Poche-C 09/21/18 2054    Little, Ambrose Finlandachel Morgan, MD 09/21/18 [id number]

## 2018-09-21 NOTE — ED Notes (Signed)
Pt returned from xray

## 2018-09-21 NOTE — Discharge Instructions (Signed)
Please follow up with your primary care as needed. If you experience any chest pain, shortness of breath or worsening symptoms you may return to the ED.

## 2018-09-21 NOTE — ED Notes (Signed)
Pt called out with c/o a headache. Pt informed Raynelle Fanning, RN would be in with nausea medication soon. Raynelle Fanning, RN also made aware of pts request.

## 2019-01-09 ENCOUNTER — Emergency Department (HOSPITAL_COMMUNITY)
Admission: EM | Admit: 2019-01-09 | Discharge: 2019-01-09 | Discharge status: Against Medical Advice | Payer: Medicaid Other | Attending: Emergency Medicine | Admitting: Emergency Medicine

## 2019-01-09 ENCOUNTER — Encounter (HOSPITAL_COMMUNITY): Payer: Self-pay | Admitting: Emergency Medicine

## 2019-01-09 ENCOUNTER — Other Ambulatory Visit: Payer: Self-pay

## 2019-01-09 DIAGNOSIS — O219 Vomiting of pregnancy, unspecified: Secondary | ICD-10-CM

## 2019-01-09 DIAGNOSIS — Z5320 Procedure and treatment not carried out because of patient's decision for unspecified reasons: Secondary | ICD-10-CM | POA: Diagnosis not present

## 2019-01-09 DIAGNOSIS — O26891 Other specified pregnancy related conditions, first trimester: Secondary | ICD-10-CM

## 2019-01-09 DIAGNOSIS — O210 Mild hyperemesis gravidarum: Secondary | ICD-10-CM | POA: Insufficient documentation

## 2019-01-09 DIAGNOSIS — Z3A00 Weeks of gestation of pregnancy not specified: Secondary | ICD-10-CM | POA: Diagnosis not present

## 2019-01-09 DIAGNOSIS — R1030 Lower abdominal pain, unspecified: Secondary | ICD-10-CM | POA: Diagnosis not present

## 2019-01-09 DIAGNOSIS — R109 Unspecified abdominal pain: Secondary | ICD-10-CM

## 2019-01-09 LAB — CBC WITH DIFFERENTIAL/PLATELET
Abs Immature Granulocytes: 0.02 10*3/uL (ref 0.00–0.07)
Basophils Absolute: 0 10*3/uL (ref 0.0–0.1)
Basophils Relative: 0 %
Eosinophils Absolute: 0.1 10*3/uL (ref 0.0–0.5)
Eosinophils Relative: 1 %
HCT: 37.1 % (ref 36.0–46.0)
Hemoglobin: 12.2 g/dL (ref 12.0–15.0)
Immature Granulocytes: 0 %
Lymphocytes Relative: 17 %
Lymphs Abs: 1.5 10*3/uL (ref 0.7–4.0)
MCH: 27.6 pg (ref 26.0–34.0)
MCHC: 32.9 g/dL (ref 30.0–36.0)
MCV: 83.9 fL (ref 80.0–100.0)
Monocytes Absolute: 0.4 10*3/uL (ref 0.1–1.0)
Monocytes Relative: 5 %
Neutro Abs: 6.7 10*3/uL (ref 1.7–7.7)
Neutrophils Relative %: 77 %
Platelets: 231 10*3/uL (ref 150–400)
RBC: 4.42 MIL/uL (ref 3.87–5.11)
RDW: 14.2 % (ref 11.5–15.5)
WBC: 8.9 10*3/uL (ref 4.0–10.5)
nRBC: 0 % (ref 0.0–0.2)

## 2019-01-09 LAB — URINALYSIS, ROUTINE W REFLEX MICROSCOPIC
Bilirubin Urine: NEGATIVE
Glucose, UA: NEGATIVE mg/dL
Hgb urine dipstick: NEGATIVE
Ketones, ur: NEGATIVE mg/dL
Leukocytes,Ua: NEGATIVE
Nitrite: NEGATIVE
Protein, ur: NEGATIVE mg/dL
Specific Gravity, Urine: 1.018 (ref 1.005–1.030)
pH: 7 (ref 5.0–8.0)

## 2019-01-09 LAB — COMPREHENSIVE METABOLIC PANEL
ALT: 21 U/L (ref 0–44)
AST: 20 U/L (ref 15–41)
Albumin: 4.1 g/dL (ref 3.5–5.0)
Alkaline Phosphatase: 74 U/L (ref 38–126)
Anion gap: 11 (ref 5–15)
BUN: 5 mg/dL — ABNORMAL LOW (ref 6–20)
CO2: 22 mmol/L (ref 22–32)
Calcium: 9.5 mg/dL (ref 8.9–10.3)
Chloride: 104 mmol/L (ref 98–111)
Creatinine, Ser: 0.63 mg/dL (ref 0.44–1.00)
GFR calc Af Amer: >60 mL/min (ref >60)
GFR calc non Af Amer: >60 mL/min (ref >60)
Glucose, Bld: 82 mg/dL (ref 70–99)
Potassium: 3.5 mmol/L (ref 3.5–5.1)
Sodium: 137 mmol/L (ref 135–145)
Total Bilirubin: 0.4 mg/dL (ref 0.3–1.2)
Total Protein: 7.2 g/dL (ref 6.5–8.1)

## 2019-01-09 LAB — ABO/RH: ABO/RH(D): B POS

## 2019-01-09 LAB — POC URINE PREG, ED: Preg Test, Ur: POSITIVE — AB

## 2019-01-09 LAB — LIPASE, BLOOD: Lipase: 27 U/L (ref 11–51)

## 2019-01-09 LAB — HCG, QUANTITATIVE, PREGNANCY: hCG, Beta Chain, Quant, S: 108211 m[IU]/mL — ABNORMAL HIGH (ref <5)

## 2019-01-09 MED ORDER — SODIUM CHLORIDE 0.9 % IV BOLUS
1000.0000 mL | Freq: Once | INTRAVENOUS | Status: AC
  Administered 2019-01-09: 13:00:00 1000 mL via INTRAVENOUS

## 2019-01-09 MED ORDER — METOCLOPRAMIDE HCL 5 MG/ML IJ SOLN
10.0000 mg | Freq: Once | INTRAMUSCULAR | Status: AC
  Administered 2019-01-09: 10 mg via INTRAVENOUS
  Filled 2019-01-09: qty 2

## 2019-01-09 MED ORDER — PRENATAL VITAMINS 28-0.8 MG PO TABS: 1.0000 | ORAL_TABLET | Freq: Every day | ORAL | 0 refills | Status: DC

## 2019-01-09 NOTE — Discharge Instructions (Addendum)
You are leaving AGAINST MEDICAL ADVICE today and we cannot rule out emergent conditions.  Please understand the risks of leaving up to and including death of yourself or your fetus.  Please return directly to [facility] for further work-up of your abdominal pain and vomiting. Begin taking prenatal vitamins daily.

## 2019-01-09 NOTE — ED Triage Notes (Addendum)
Pt here for N/V and sharp abdominal pain x1 week. Denies fevers. Pt states her last menstrual period was at the end of February. Pt states she has taken two pregnancy tests and one was negative and one was positive.

## 2019-01-09 NOTE — ED Provider Notes (Signed)
[facility] EMERGENCY DEPARTMENT Provider Note   CSN: [id number] Arrival date & time: 01/09/19  1145    History   Chief Complaint No chief complaint on file.   HPI Marie Cabrera is a 23 y.o. G1P1 female who presents with a one-week history of intermittent abdominal pain as well as nausea and vomiting.  Patient has not been able to tolerate oral fluids.  She denies any diarrhea.  She describes her abdominal pain is sharp and intermittent.  She reports this mostly in her lower abdomen.  She denies any abnormal vaginal bleeding or discharge.  LMP sometime in February.  Patient reports she could be pregnant.  Patient reports she had preeclampsia with her first pregnancy, otherwise no complications.  She has been taking Tylenol at home.     HPI  Past Medical History:  Diagnosis Date  . ADHD (attention deficit hyperactivity disorder)   . Amenorrhea    last menses 2007  . Asthma    last ospitalized >4 years ago.  NO recent problesm  . Heart murmur    [facility].  MD 2 d Echo 23012  . Rectal bleeding     Patient Active Problem List   Diagnosis Date Noted  . Postpartum anemia 06/02/2017  . NSVD (normal spontaneous vaginal delivery) 06/01/2017  . Hypertension in pregnancy, preeclampsia, severe, delivered 05/30/2017  . GDM (gestational diabetes mellitus) 03/29/2017  . Anemia affecting pregnancy in third trimester 02/27/2017  . Limited prenatal care in second trimester 02/25/2017  . Mild tetrahydrocannabinol (THC) abuse 11/19/2016  . Alcohol-induced mood disorder (HCC) 01/08/2016  . Substance abuse (HCC)   . Oppositional defiant disorder 05/22/2012  . Hematochezia     Past Surgical History:  Procedure Laterality Date  . COLONOSCOPY  03/14/2012   Procedure: COLONOSCOPY;  Surgeon: Jon Gills, MD;  Location: [facility] OR;  Service: Gastroenterology;  Laterality: N/A;  . NO PAST SURGERIES    . No surgical history       OB History    Gravida  1   Para  1   Term  1   Preterm      AB      Living  1     SAB      TAB      Ectopic      Multiple      Live Births  1            Home Medications    Prior to Admission medications   Medication Sig Start Date End Date Taking? Authorizing Provider  omeprazole (PRILOSEC) 20 MG capsule Take 1 capsule (20 mg total) by mouth daily. Patient not taking: Reported on 01/09/2019 03/10/18   Georgetta Haber, NP  Prenatal Vit-Fe Fumarate-FA (PRENATAL VITAMINS) 28-0.8 MG TABS Take 1 tablet by mouth daily. 01/09/19   Emi Holes, PA-C    Family History Family History  Adopted: Yes  Problem Relation Age of Onset  . Crohn's disease Maternal Aunt   . Colon polyps Maternal Aunt   . Colon cancer Maternal Grandfather   . Alcohol abuse Mother   . Depression Mother   . Drug abuse Mother   . Mental illness Mother   . Alcohol abuse Father   . Drug abuse Father   . Mental illness Sister   . Alcohol abuse Maternal Grandmother   . Depression Maternal Grandmother   . Diabetes Maternal Grandmother   . Kidney disease Maternal Grandmother   . Other Other  Pt is adopted, only knows about maternal family    Social History Social History   Tobacco Use  . Smoking status: Never Smoker  . Smokeless tobacco: Never Used  Substance Use Topics  . Alcohol use: No  . Drug use: No    Types: Marijuana    Comment: last use June 2018     Allergies   Patient has no known allergies.   Review of Systems Review of Systems  Constitutional: Negative for chills and fever.  HENT: Negative for facial swelling and sore throat.   Respiratory: Negative for shortness of breath.   Cardiovascular: Negative for chest pain.  Gastrointestinal: Positive for abdominal pain, nausea and vomiting. Negative for diarrhea.  Genitourinary: Negative for dysuria, vaginal bleeding and vaginal discharge.  Musculoskeletal: Negative for back pain.  Skin: Negative for rash and wound.  Neurological: Negative  for headaches.  Psychiatric/Behavioral: The patient is not nervous/anxious.      Physical Exam Updated Vital Signs BP 136/83 (BP Location: Right Arm)   Pulse 71   Temp 98.8 F (37.1 C) (Oral)   Resp 16   Ht 5' (1.524 m)   Wt 59 kg   SpO2 100%   BMI 25.39 kg/m   Physical Exam Vitals signs and nursing note reviewed.  Constitutional:      General: She is not in acute distress.    Appearance: She is well-developed. She is not diaphoretic.  HENT:     Head: Normocephalic and atraumatic.     Mouth/Throat:     Pharynx: No oropharyngeal exudate.  Eyes:     General: No scleral icterus.       Right eye: No discharge.        Left eye: No discharge.     Conjunctiva/sclera: Conjunctivae normal.     Pupils: Pupils are equal, round, and reactive to light.  Neck:     Musculoskeletal: Normal range of motion and neck supple.     Thyroid: No thyromegaly.  Cardiovascular:     Rate and Rhythm: Normal rate and regular rhythm.     Heart sounds: Normal heart sounds. No murmur. No friction rub. No gallop.   Pulmonary:     Effort: Pulmonary effort is normal. No respiratory distress.     Breath sounds: Normal breath sounds. No stridor. No wheezing or rales.  Abdominal:     General: Bowel sounds are normal. There is no distension.     Palpations: Abdomen is soft.     Tenderness: There is no abdominal tenderness. There is no guarding or rebound.  Lymphadenopathy:     Cervical: No cervical adenopathy.  Skin:    General: Skin is warm and dry.     Coloration: Skin is not pale.     Findings: No rash.  Neurological:     Mental Status: She is alert.     Coordination: Coordination normal.      ED Treatments / Results  Labs (all labs ordered are listed, but only abnormal results are displayed) Labs Reviewed  URINALYSIS, ROUTINE W REFLEX MICROSCOPIC - Abnormal; Notable for the following components:      Result Value   APPearance CLOUDY (*)    All other components within normal limits   COMPREHENSIVE METABOLIC PANEL - Abnormal; Notable for the following components:   BUN 5 (*)    All other components within normal limits  HCG, QUANTITATIVE, PREGNANCY - Abnormal; Notable for the following components:   hCG, Beta Chain, Quant, S 108,211 (*)  All other components within normal limits  POC URINE PREG, ED - Abnormal; Notable for the following components:   Preg Test, Ur POSITIVE (*)    All other components within normal limits  LIPASE, BLOOD  CBC WITH DIFFERENTIAL/PLATELET  ABO/RH    EKG None  Radiology No results found.  Procedures Procedures (including critical care time)  Medications Ordered in ED Medications  sodium chloride 0.9 % bolus 1,000 mL (0 mLs Intravenous Stopped 01/09/19 1506)  metoCLOPramide (REGLAN) injection 10 mg (10 mg Intravenous Given 01/09/19 1302)     Initial Impression / Assessment and Plan / ED Course  I have reviewed the triage vital signs and the nursing notes.  Pertinent labs & imaging results that were available during my care of the patient were reviewed by me and considered in my medical decision making (see chart for details).        Patient presenting with abdominal pain that is intermittent as well as nausea and vomiting for the past week.  She is found to be pregnant.  hCG quant [id number]. Patient was going to be transferred to the MAU for further work-up of ectopic pregnancy after discussing with APP Victorino DikeJennifer, however she needed to leave AGAINST MEDICAL ADVICE.  She is advised [facility].  She understands the risks of leaving including and up to death of herself or fetus.  Final Clinical Impressions(s) / ED Diagnoses   Final diagnoses:  Abdominal pain during pregnancy in first trimester  Vomiting pregnancy    ED Discharge Orders         Ordered    Prenatal Vit-Fe Fumarate-FA (PRENATAL VITAMINS) 28-0.8 MG TABS  Daily     01/09/19 1500           Emi HolesLaw,  Dawnya Grams M, [place]PA-C 01/09/19 1521    Maia PlanLong, Joshua G, MD 01/09/19 2109

## 2019-02-09 ENCOUNTER — Other Ambulatory Visit: Payer: Self-pay

## 2019-02-09 ENCOUNTER — Encounter: Payer: Self-pay | Admitting: Obstetrics and Gynecology

## 2019-02-09 ENCOUNTER — Ambulatory Visit (INDEPENDENT_AMBULATORY_CARE_PROVIDER_SITE_OTHER): Payer: Medicaid Other | Admitting: Obstetrics and Gynecology

## 2019-02-09 ENCOUNTER — Other Ambulatory Visit (HOSPITAL_COMMUNITY)
Admission: RE | Admit: 2019-02-09 | Discharge: 2019-02-09 | Disposition: A | Discharge status: Home / Self Care | Payer: Medicaid Other | Source: Ambulatory Visit | Attending: Obstetrics and Gynecology | Admitting: Obstetrics and Gynecology

## 2019-02-09 VITALS — BP 147/93 | HR 97 | Wt 139.5 lb

## 2019-02-09 DIAGNOSIS — J45909 Unspecified asthma, uncomplicated: Secondary | ICD-10-CM | POA: Insufficient documentation

## 2019-02-09 DIAGNOSIS — O0990 Supervision of high risk pregnancy, unspecified, unspecified trimester: Secondary | ICD-10-CM | POA: Insufficient documentation

## 2019-02-09 DIAGNOSIS — Z3481 Encounter for supervision of other normal pregnancy, first trimester: Secondary | ICD-10-CM

## 2019-02-09 DIAGNOSIS — B9689 Other specified bacterial agents as the cause of diseases classified elsewhere: Secondary | ICD-10-CM

## 2019-02-09 DIAGNOSIS — N760 Acute vaginitis: Secondary | ICD-10-CM

## 2019-02-09 DIAGNOSIS — O09299 Supervision of pregnancy with other poor reproductive or obstetric history, unspecified trimester: Secondary | ICD-10-CM

## 2019-02-09 DIAGNOSIS — Z3A12 12 weeks gestation of pregnancy: Secondary | ICD-10-CM

## 2019-02-09 DIAGNOSIS — R030 Elevated blood-pressure reading, without diagnosis of hypertension: Secondary | ICD-10-CM

## 2019-02-09 DIAGNOSIS — O0991 Supervision of high risk pregnancy, unspecified, first trimester: Secondary | ICD-10-CM

## 2019-02-09 DIAGNOSIS — O09291 Supervision of pregnancy with other poor reproductive or obstetric history, first trimester: Secondary | ICD-10-CM

## 2019-02-09 DIAGNOSIS — Z8632 Personal history of gestational diabetes: Secondary | ICD-10-CM

## 2019-02-09 MED ORDER — BLOOD PRESSURE KIT: 1.0000 | PACK | 0 refills | Status: DC

## 2019-02-09 MED ORDER — VITAFOL GUMMIES 3.33-0.333-34.8 MG PO CHEW: 1.0000 | CHEWABLE_TABLET | Freq: Every day | ORAL | 5 refills | Status: DC

## 2019-02-09 MED ORDER — DOXYLAMINE-PYRIDOXINE 10-10 MG PO TBEC: 2.0000 | DELAYED_RELEASE_TABLET | Freq: Every day | ORAL | 5 refills | Status: DC

## 2019-02-09 NOTE — Progress Notes (Signed)
Pt is in the office for NOB, pt states that she thinks she is farther along in the pregnancy, pt reports feeling fetal flutter movements, denies pain.Pt denies any abnormal symptoms with BP being elevated today.

## 2019-02-09 NOTE — Progress Notes (Signed)
INITIAL PRENATAL VISIT NOTE  Subjective:  Marie Cabrera is a 23 y.o. G1P1001 at 46w1dby LMP  [facility] [place] [facility] being seen today for her initial prenatal visit. This is an unplanned pregnancy. She and partner are happy with the pregnancy. She was using nothing for birth control previously. She has an obstetric history significant for SVD c/b PEC, gDMA1. She has a medical history significant for asthma.  Patient reports nausea.  Contractions: Not present. Vag. Bleeding: None.  Movement: Present. Denies leaking of fluid.    Past Medical History:  Diagnosis Date  . ADHD (attention deficit hyperactivity disorder)   . Amenorrhea    last menses 2007  . Asthma    last ospitalized >4 years ago.  NO recent problesm  . Heart murmur    [facility].  MD 2 d Echo [id number]  . Rectal bleeding     Past Surgical History:  Procedure Laterality Date  . COLONOSCOPY  03/14/2012   Procedure: COLONOSCOPY;  Surgeon: JOletha Blend MD;  [facility]  Service: Gastroenterology;  Laterality: N/A;  . NO PAST SURGERIES    . No surgical history      OB History  Gravida Para Term Preterm AB Living  2 1 1     1   SAB TAB Ectopic Multiple Live Births          1    # Outcome Date GA Lbr Len/2nd Weight Sex Delivery Anes PTL Lv  2 Current           1 Term 06/01/17 340w0d4:12 / 00:32 6 lb 1.9 oz (2.775 kg) M Vag-Spont EPI  LIV    Social History   Socioeconomic History  . Marital status: Single    Spouse name: Not on file  . Number of children: Not on file  . Years of education: Not on file  . Highest education level: Not on file  Occupational History  . 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 Financial resource strain: Not on file  . Food insecurity:    Worry: Not on file    Inability: Not on file  . Transportation needs:    Medical: Not on file    Non-medical: Not on file  Tobacco Use  . Smoking status:  Never Smoker  . Smokeless tobacco: Never Used  Substance and Sexual Activity  . Alcohol use: No  . Drug use: Yes    Types: Marijuana    Comment: last use June 2018  . Sexual activity: Yes    Partners: Male    Birth control/protection: Condom    Comment: per patient  Lifestyle  . Physical activity:    Days per week: Not on file    Minutes per session: Not on file  . Stress: Not on file  Relationships  . Social connections:    Talks on phone: Not on file    Gets together: Not on file    Attends religious service: Not on file    Active member of club or organization: Not on file    Attends meetings of clubs or organizations: Not on file    Relationship status: Not on file  Other Topics Concern  . Not on file  Social History Narrative  . Not on file    Family History  Adopted: Yes  Problem Relation Age of Onset  . Crohn's disease Maternal  Aunt   . Colon polyps Maternal Aunt   . Colon cancer Maternal Grandfather   . Alcohol abuse Mother   . Depression Mother   . Drug abuse Mother   . Mental illness Mother   . Alcohol abuse Father   . Drug abuse Father   . Mental illness Sister   . Alcohol abuse Maternal Grandmother   . Depression Maternal Grandmother   . Diabetes Maternal Grandmother   . Kidney disease Maternal Grandmother   . Other Other        Pt is adopted, only knows about maternal family    Current Outpatient Medications:  .  Blood Pressure KIT, 1 kit by Does not apply route once a week., Disp: 1 each, Rfl: 0 .  Doxylamine-Pyridoxine (DICLEGIS) 10-10 MG TBEC, Take 2 tablets by mouth at bedtime. If symptoms persist, add one tablet in the morning and one in the afternoon, Disp: 100 tablet, Rfl: 5 .  omeprazole (PRILOSEC) 20 MG capsule, Take 1 capsule (20 mg total) by mouth daily. (Patient not taking: Reported on 01/09/2019), Disp: 30 capsule, Rfl: 0 .  Prenatal Vit-Fe Fumarate-FA (PRENATAL VITAMINS) 28-0.8 MG TABS, Take 1 tablet by mouth daily. (Patient not taking:  Reported on 02/09/2019), Disp: 30 tablet, Rfl: 0 .  Prenatal Vit-Fe Phos-FA-Omega (VITAFOL GUMMIES) 3.33-0.333-34.8 MG CHEW, Chew 1 tablet by mouth daily., Disp: 90 tablet, Rfl: 5  No Known Allergies  Review of Systems: Negative except for what is mentioned in HPI.  Objective:   Vitals:   02/09/19 1053 02/09/19 1058  BP: (!) 147/88 (!) 147/93  Pulse: 98 97  Weight: 139 lb 8 oz (63.3 kg)    Fetal Status: Fetal Heart Rate (bpm): 156   Movement: Present     Physical Exam: BP (!) 147/93   Pulse 97   Wt 139 lb 8 oz (63.3 kg)   LMP 11/16/2018   BMI 27.24 kg/m  CONSTITUTIONAL: Well-developed, well-nourished female in no acute distress.  NEUROLOGIC: Alert and oriented to person, place, and time. Normal reflexes, muscle tone coordination. No cranial nerve deficit noted. PSYCHIATRIC: Normal mood and affect. Normal behavior. Normal judgment and thought content. SKIN: Skin is warm and dry. No rash noted. Not diaphoretic. No erythema. No pallor. HENT:  Normocephalic, atraumatic, External right and left ear normal. Oropharynx is clear and moist EYES: Conjunctivae and EOM are normal. Pupils are equal, round, and reactive to light. No scleral icterus.  NECK: Normal range of motion, supple, no masses CARDIOVASCULAR: Normal heart rate noted, regular rhythm RESPIRATORY: Effort and breath sounds normal, no problems with respiration noted BREASTS: symmetric, non-tender, no masses palpable ABDOMEN: Soft, nontender, nondistended, gravid. GU: normal appearing external female genitalia, multiparous normal appearing cervix, scant white discharge in vagina, no lesions noted Bimanual: 14 weeks sized uterus, no adnexal tenderness or palpable lesions noted MUSCULOSKELETAL: Normal range of motion. EXT:  No edema and no tenderness. 2+ distal pulses.   Assessment and Plan:  Pregnancy: G1P1001 at 44w1dby LMP c/w 8 weeks [place] 1. Hx of preeclampsia, prior pregnancy, currently pregnant - [place] OB DETAIL +14  WK; Future - patient expressed an interest in BTL but is unsure, counseled and gave info about contraception today  2. History of gestational diabetes A1c today  3. Supervision of high risk pregnancy, antepartum [facility], multiple providers, fellows, medical students, virtual visits, MyChart.  - Cytology - PAP( [place]) - Cervicovaginal ancillary only( [place]) - Culture, OB Urine - Genetic  Screening - Obstetric Panel, Including HIV - Babyscripts Schedule Optimization - Hemoglobin A1c - Comp Met (CMET) - Protein / creatinine ratio, urine  4. Elevated blood pressure reading in office without diagnosis of hypertension - Patient sent home on meds after last pregnancy, took them until they ran out and then stopped - has no formal diagnosis of cHTN - reviewed elevated BP today very suspicious, but prior ED visits show 130s/80s, no other criteria for hypertension met - CMP, UPCr today - patient to get cuff, reviewed she may have chronic and we will start on meds as needed, for now, no meds   Preterm labor symptoms and general obstetric precautions including but not limited to vaginal bleeding, contractions, leaking of fluid and fetal movement were reviewed in detail with the patient.  Please refer to After Visit Summary for other counseling recommendations.   Return in about 4 weeks (around 03/09/2019) for OB visit (MD), virtual.  Sloan Leiter 02/09/2019 1:17 PM

## 2019-02-09 NOTE — Patient Instructions (Addendum)
HOW TO TAKE YOUR BLOOD PRESSURE AT HOME  Take your blood pressure at home. Pick one day a week and take your blood pressure consistently every week on this day. Relax quietly for about 15 minutes then take your blood pressure. DO NOT take it when you are upset, stressed, or have just been active. Make sure your blood pressure cuff fits appropriately, there should be measurements on the box and on the cuff to help guide you.   Once you have taken your blood pressure, look at the numbers. If the top number (systolic) is equal to or above 140, please call the office and let us know. If the bottom number (diastolic) is equal to or above 90, please call the office and let us know.   If you have any questions or are concerned you are not taking your blood pressure correctly, please call the office!  If you have a headache that does not improve, problems with your vision, abdominal pain or other concerns, please call and let us know. Please go to the hospital with any severe symptoms. Please go to the hospital for labor, painful contractions every 5 minutes or less, leaking of fluid, or if you have not felt normal fetal movement.    Use the following websites (and others) to help learn more about your contraception options and find the method that is right for you!  - The Centers for Disease Control (CDC) website: TransferLive.se  - [facility] website: https://www.plannedparenthood.org/learn/birth-control  - Bedsider.org: https://www.bedsider.org/methods  Etonogestrel implant What is this medicine? ETONOGESTREL (et oh noe JES trel) is a contraceptive (birth control) device. It is used to prevent pregnancy. It can be used for up to 3 years. This medicine may be used for other purposes; ask your health care provider or pharmacist if you have questions. COMMON BRAND NAME(S): Implanon, Nexplanon What should I tell my health care provider before I  take this medicine? They need to know if you have any of these conditions: -abnormal vaginal bleeding -blood vessel disease or blood clots -breast, cervical, endometrial, ovarian, liver, or uterine cancer -diabetes -gallbladder disease -heart disease or recent heart attack -high blood pressure -high cholesterol or triglycerides -kidney disease -liver disease -migraine headaches -seizures -stroke -tobacco smoker -an unusual or allergic reaction to etonogestrel, anesthetics or antiseptics, other medicines, foods, dyes, or preservatives -pregnant or trying to get pregnant -breast-feeding How should I use this medicine? This device is inserted just under the skin on the inner side of your upper arm by a health care professional. Talk to your pediatrician regarding the use of this medicine in children. Special care may be needed. Overdosage: If you think you have taken too much of this medicine contact a poison control center or emergency room at once. NOTE: This medicine is only for you. Do not share this medicine with others. What if I miss a dose? This does not apply. What may interact with this medicine? Do not take this medicine with any of the following medications: -amprenavir -fosamprenavir This medicine may also interact with the following medications: -acitretin -aprepitant -armodafinil -bexarotene -bosentan -carbamazepine -certain medicines for fungal infections like fluconazole, ketoconazole, itraconazole and voriconazole -certain medicines to treat hepatitis, HIV or AIDS -cyclosporine -felbamate -griseofulvin -lamotrigine -modafinil -oxcarbazepine -phenobarbital -phenytoin -primidone -rifabutin -rifampin -rifapentine -St. John's wort -topiramate This list may not describe all possible interactions. Give your health care provider a list of all the medicines, herbs, non-prescription drugs, or dietary supplements you use. Also tell them if you smoke, drink  alcohol, or use illegal drugs. Some items may interact with your medicine. What should I watch for while using this medicine? This product does not protect you against HIV infection (AIDS) or other sexually transmitted diseases. You should be able to feel the implant by pressing your fingertips over the skin where it was inserted. Contact your doctor if you cannot feel the implant, and use a non-hormonal birth control method (such as condoms) until your doctor confirms that the implant is in place. Contact your doctor if you think that the implant may have broken or become bent while in your arm. You will receive a user card from your health care provider after the implant is inserted. The card is a record of the location of the implant in your upper arm and when it should be removed. Keep this card with your health records. What side effects may I notice from receiving this medicine? Side effects that you should report to your doctor or health care professional as soon as possible: -allergic reactions like skin rash, itching or hives, swelling of the face, lips, or tongue -breast lumps, breast tissue changes, or discharge -breathing problems -changes in emotions or moods -if you feel that the implant may have broken or bent while in your arm -high blood pressure -pain, irritation, swelling, or bruising at the insertion site -scar at site of insertion -signs of infection at the insertion site such as fever, and skin redness, pain or discharge -signs and symptoms of a blood clot such as breathing problems; changes in vision; chest pain; severe, sudden headache; pain, swelling, warmth in the leg; trouble speaking; sudden numbness or weakness of the face, arm or leg -signs and symptoms of liver injury like dark yellow or brown urine; general ill feeling or flu-like symptoms; light-colored stools; loss of appetite; nausea; right upper belly pain; unusually weak or tired; yellowing of the eyes or  skin -unusual vaginal bleeding, discharge Side effects that usually do not require medical attention (report to your doctor or health care professional if they continue or are bothersome): -acne -breast pain or tenderness -headache -irregular menstrual bleeding -nausea This list may not describe all possible side effects. Call your doctor for medical advice about side effects. You may report side effects to FDA at 1-800-FDA-1088. Where should I keep my medicine? This drug is given in a hospital or clinic and will not be stored at home. NOTE: This sheet is a summary. It may not cover all possible information. If you have questions about this medicine, talk to your doctor, pharmacist, or health care provider.  2019 Elsevier/Gold Standard (2017-07-16 14:11:42)  Intrauterine Device Information An intrauterine device (IUD) is a medical device that is inserted in the uterus to prevent pregnancy. It is a small, T-shaped device that has one or two nylon strings hanging down from it. The strings hang out of the lower part of the uterus (cervix) to allow for future IUD removal. There are two types of IUDs available:  Hormone IUD. This type of IUD is made of plastic and contains the hormone progestin (synthetic progesterone). A hormone IUD may last 3-5 years.  Copper IUD. This type of IUD has copper wire wrapped around it. A copper IUD may last up to 10 years. How is an IUD inserted? An IUD is inserted through the vagina and placed into the uterus with a minor medical procedure. The exact procedure for IUD insertion may vary among health care providers and hospitals. How does an IUD work? Synthetic  progesterone in a hormonal IUD prevents pregnancy by:  Thickening cervical mucus to prevent sperm from entering the uterus.  Thinning the uterine lining to prevent a fertilized egg from being implanted there. Copper in a copper IUD prevents pregnancy by making the uterus and fallopian tubes produce a  fluid that kills sperm. What are the advantages of an IUD? Advantages of either type of IUD  It is highly effective in preventing pregnancy.  It is reversible. You can become pregnant shortly after the IUD is removed.  It is low-maintenance and can stay in place for a long time.  There are no estrogen-related side effects.  It can be used when breastfeeding.  It is not associated with weight gain.  It can be inserted right after childbirth, an abortion, or a miscarriage. Advantages of a hormone IUD  If it is inserted within 7 days of your period starting, it works right after it is inserted. If the hormone IUD is inserted at any other time in your cycle, you will need to use a backup method of birth control for 7 days after insertion.  It can make menstrual periods lighter.  It can reduce menstrual cramping.  It can be used for 3-5 years. Advantages of a copper IUD  It works right after it is inserted.  It can be used as a form of emergency birth control if it is inserted within 5 days after having unprotected sex.  It does not interfere with your body's natural hormones.  It can be used for 10 years. What are the disadvantages of an IUD?  An IUD may cause irregular menstrual bleeding for a period of time after insertion.  You may have pain during insertion and have cramping and vaginal bleeding after insertion.  An IUD may cut the uterus (uterine perforation) when it is inserted. This is rare.  An IUD may cause pelvic inflammatory disease (PID), which is an infection in the uterus and fallopian tubes. This is rare, and it usually happens during the first 20 days after the IUD is inserted.  A copper IUD can make your menstrual flow heavier and more painful. How is an IUD removed?  You will lie on your back with your knees bent and your feet in footrests (stirrups).  A device will be inserted into your vagina to spread apart the vaginal walls (speculum). This will  allow your health care provider to see the strings attached to the IUD.  Your health care provider will use a small instrument (forceps) to grasp the IUD strings and pull firmly until the IUD is removed. You may have some discomfort when the IUD is removed. Your health care provider may recommend taking over-the-counter pain relievers, such as ibuprofen, before the procedure. You may also have minor spotting for a few days after the procedure. The exact procedure for IUD removal may vary among health care providers and hospitals. Is the IUD right for me? Your health care provider will make sure you are a good candidate for an IUD and will discuss the advantages, disadvantages, and possible side effects with you. Summary  An intrauterine device (IUD) is a medical device that is inserted in the uterus to prevent pregnancy. It is a small, T-shaped device that has one or two nylon strings hanging down from it.  A hormone IUD contains the hormone progestin (synthetic progesterone). A copper IUD has copper wire wrapped around it.  Synthetic progesterone in a hormone IUD prevents pregnancy by thickening cervical mucus and  thinning the walls of the uterus. Copper in a copper IUD prevents pregnancy by making the uterus and fallopian tubes produce a fluid that kills sperm.  A hormone IUD can be left in place for 3-5 years. A copper IUD can be left in place for up to 10 years.  An IUD is inserted and removed by a health care provider. You may feel some pain during insertion and removal. Your health care provider may recommend taking over-the-counter pain medicine, such as ibuprofen, before an IUD procedure. This information is not intended to replace advice given to you by your health care provider. Make sure you discuss any questions you have with your health care provider. Document Released: 07/31/2004 Document Revised: 09/25/2016 Document Reviewed: 09/25/2016 Elsevier Interactive Patient Education  2019  [employer].

## 2019-02-10 LAB — CERVICOVAGINAL ANCILLARY ONLY
Bacterial vaginitis: POSITIVE — AB
Candida vaginitis: NEGATIVE
Chlamydia: NEGATIVE
Neisseria Gonorrhea: NEGATIVE
Trichomonas: NEGATIVE

## 2019-02-11 LAB — OBSTETRIC PANEL, INCLUDING HIV
Antibody Screen: NEGATIVE
Basophils Absolute: 0 10*3/uL (ref 0.0–0.2)
Basos: 0 %
EOS (ABSOLUTE): 0.2 10*3/uL (ref 0.0–0.4)
Eos: 2 %
HIV Screen 4th Generation wRfx: NONREACTIVE
Hematocrit: 35.8 % (ref 34.0–46.6)
Hemoglobin: 11.7 g/dL (ref 11.1–15.9)
Hepatitis B Surface Ag: NEGATIVE
Immature Grans (Abs): 0 10*3/uL (ref 0.0–0.1)
Immature Granulocytes: 0 %
Lymphocytes Absolute: 1.5 10*3/uL (ref 0.7–3.1)
Lymphs: 16 %
MCH: 27.9 pg (ref 26.6–33.0)
MCHC: 32.7 g/dL (ref 31.5–35.7)
MCV: 85 fL (ref 79–97)
Monocytes Absolute: 0.5 10*3/uL (ref 0.1–0.9)
Monocytes: 5 %
Neutrophils Absolute: 7.5 10*3/uL — ABNORMAL HIGH (ref 1.4–7.0)
Neutrophils: 77 %
Platelets: 271 10*3/uL (ref 150–450)
RBC: 4.19 x10E6/uL (ref 3.77–5.28)
RDW: 14 % (ref 11.7–15.4)
RPR Ser Ql: NONREACTIVE
Rh Factor: POSITIVE
Rubella Antibodies, IGG: 3.24 index (ref >0.99)
WBC: 9.7 10*3/uL (ref 3.4–10.8)

## 2019-02-11 LAB — COMPREHENSIVE METABOLIC PANEL
ALT: 22 IU/L (ref 0–32)
AST: 17 IU/L (ref 0–40)
Albumin/Globulin Ratio: 1.6 (ref 1.2–2.2)
Albumin: 4.1 g/dL (ref 3.9–5.0)
Alkaline Phosphatase: 91 IU/L (ref 39–117)
BUN/Creatinine Ratio: 18 (ref 9–23)
BUN: 9 mg/dL (ref 6–20)
Bilirubin Total: <0.2 mg/dL (ref 0.0–1.2)
CO2: 19 mmol/L — ABNORMAL LOW (ref 20–29)
Calcium: 9.4 mg/dL (ref 8.7–10.2)
Chloride: 104 mmol/L (ref 96–106)
Creatinine, Ser: 0.5 mg/dL — ABNORMAL LOW (ref 0.57–1.00)
GFR calc Af Amer: 157 mL/min/{1.73_m2} (ref >59)
GFR calc non Af Amer: 136 mL/min/{1.73_m2} (ref >59)
Globulin, Total: 2.6 g/dL (ref 1.5–4.5)
Glucose: 76 mg/dL (ref 65–99)
Potassium: 3.8 mmol/L (ref 3.5–5.2)
Sodium: 140 mmol/L (ref 134–144)
Total Protein: 6.7 g/dL (ref 6.0–8.5)

## 2019-02-11 LAB — PROTEIN / CREATININE RATIO, URINE
Creatinine, Urine: 178.9 mg/dL
Protein, Ur: 16.7 mg/dL
Protein/Creat Ratio: 93 mg/g creat (ref 0–200)

## 2019-02-11 LAB — HEMOGLOBIN A1C
Est. average glucose Bld gHb Est-mCnc: 108 mg/dL
Hgb A1c MFr Bld: 5.4 % (ref 4.8–5.6)

## 2019-02-11 LAB — CULTURE, OB URINE

## 2019-02-11 LAB — URINE CULTURE, OB REFLEX

## 2019-02-12 MED ORDER — METRONIDAZOLE 500 MG PO TABS: 500.0000 mg | ORAL_TABLET | Freq: Two times a day (BID) | ORAL | 0 refills | Status: DC

## 2019-02-12 NOTE — Addendum Note (Signed)
Addended by: Leroy Libman on: 02/12/2019 04:47 PM   Modules accepted: Orders

## 2019-02-16 ENCOUNTER — Telehealth: Payer: Self-pay

## 2019-02-16 ENCOUNTER — Encounter: Payer: Self-pay | Admitting: Obstetrics and Gynecology

## 2019-02-16 NOTE — Telephone Encounter (Signed)
Returned call and answered questions about BP kit 

## 2019-02-18 ENCOUNTER — Encounter: Payer: Self-pay | Admitting: Obstetrics and Gynecology

## 2019-02-18 DIAGNOSIS — D563 Thalassemia minor: Secondary | ICD-10-CM

## 2019-02-23 ENCOUNTER — Encounter: Payer: Self-pay | Admitting: Obstetrics and Gynecology

## 2019-02-23 DIAGNOSIS — D563 Thalassemia minor: Secondary | ICD-10-CM | POA: Insufficient documentation

## 2019-02-23 DIAGNOSIS — D560 Alpha thalassemia: Secondary | ICD-10-CM | POA: Insufficient documentation

## 2019-02-23 NOTE — Progress Notes (Signed)
Called and informed patient of alpha thal carrier status, will refer to genetic counseling, she is agreeable.

## 2019-02-24 ENCOUNTER — Emergency Department (HOSPITAL_COMMUNITY): Payer: Medicaid Other

## 2019-02-24 ENCOUNTER — Emergency Department (HOSPITAL_COMMUNITY): Payer: Medicaid Other | Admitting: Critical Care Medicine

## 2019-02-24 ENCOUNTER — Ambulatory Visit (HOSPITAL_COMMUNITY)
Admission: EM | Admit: 2019-02-24 | Discharge: 2019-02-24 | Disposition: A | Discharge status: Home / Self Care | Payer: Medicaid Other | Attending: Emergency Medicine | Admitting: Emergency Medicine

## 2019-02-24 ENCOUNTER — Encounter (HOSPITAL_COMMUNITY): Payer: Self-pay

## 2019-02-24 ENCOUNTER — Other Ambulatory Visit: Payer: Self-pay

## 2019-02-24 ENCOUNTER — Encounter (HOSPITAL_COMMUNITY)
Admission: EM | Disposition: A | Discharge status: Home / Self Care | Payer: Self-pay | Source: Home / Self Care | Attending: Emergency Medicine

## 2019-02-24 DIAGNOSIS — O9A212 Injury, poisoning and certain other consequences of external causes complicating pregnancy, second trimester: Secondary | ICD-10-CM | POA: Insufficient documentation

## 2019-02-24 DIAGNOSIS — Z1159 Encounter for screening for other viral diseases: Secondary | ICD-10-CM | POA: Insufficient documentation

## 2019-02-24 DIAGNOSIS — S66121A Laceration of flexor muscle, fascia and tendon of left index finger at wrist and hand level, initial encounter: Secondary | ICD-10-CM | POA: Insufficient documentation

## 2019-02-24 DIAGNOSIS — W230XXA Caught, crushed, jammed, or pinched between moving objects, initial encounter: Secondary | ICD-10-CM | POA: Insufficient documentation

## 2019-02-24 DIAGNOSIS — O26892 Other specified pregnancy related conditions, second trimester: Secondary | ICD-10-CM | POA: Insufficient documentation

## 2019-02-24 DIAGNOSIS — S62621B Displaced fracture of medial phalanx of left index finger, initial encounter for open fracture: Secondary | ICD-10-CM | POA: Insufficient documentation

## 2019-02-24 DIAGNOSIS — Z3A14 14 weeks gestation of pregnancy: Secondary | ICD-10-CM | POA: Insufficient documentation

## 2019-02-24 DIAGNOSIS — S67191A Crushing injury of left index finger, initial encounter: Secondary | ICD-10-CM | POA: Insufficient documentation

## 2019-02-24 DIAGNOSIS — S6992XA Unspecified injury of left wrist, hand and finger(s), initial encounter: Secondary | ICD-10-CM | POA: Diagnosis present

## 2019-02-24 DIAGNOSIS — J45909 Unspecified asthma, uncomplicated: Secondary | ICD-10-CM | POA: Insufficient documentation

## 2019-02-24 HISTORY — PX: CLOSED REDUCTION FINGER WITH PERCUTANEOUS PINNING: SHX5612

## 2019-02-24 HISTORY — PX: I & D EXTREMITY: SHX5045

## 2019-02-24 LAB — SARS CORONAVIRUS 2: SARS Coronavirus 2: NOT DETECTED

## 2019-02-24 SURGERY — IRRIGATION AND DEBRIDEMENT EXTREMITY
Anesthesia: Monitor Anesthesia Care | Site: Finger | Laterality: Left

## 2019-02-24 MED ORDER — CEFAZOLIN SODIUM-DEXTROSE 2-4 GM/100ML-% IV SOLN
2.0000 g | Freq: Once | INTRAVENOUS | Status: AC
  Administered 2019-02-24: 14:00:00 2 g via INTRAVENOUS
  Filled 2019-02-24: qty 100

## 2019-02-24 MED ORDER — PROPOFOL 500 MG/50ML IV EMUL
INTRAVENOUS | Status: DC | PRN
  Administered 2019-02-24: 75 ug/kg/min via INTRAVENOUS

## 2019-02-24 MED ORDER — LACTATED RINGERS IV SOLN: INTRAVENOUS | Status: DC

## 2019-02-24 MED ORDER — PROPOFOL 10 MG/ML IV BOLUS
INTRAVENOUS | Status: AC
  Filled 2019-02-24: qty 20

## 2019-02-24 MED ORDER — SODIUM CHLORIDE 0.9 % IR SOLN
Status: DC | PRN
  Administered 2019-02-24: 3000 mL
  Administered 2019-02-24: 1000 mL

## 2019-02-24 MED ORDER — LACTATED RINGERS IV SOLN
INTRAVENOUS | Status: DC
  Administered 2019-02-24: 17:00:00 via INTRAVENOUS

## 2019-02-24 MED ORDER — BUPIVACAINE-EPINEPHRINE (PF) 0.5% -1:200000 IJ SOLN
INTRAMUSCULAR | Status: DC | PRN
  Administered 2019-02-24: 30 mL via PERINEURAL

## 2019-02-24 MED ORDER — PROPOFOL 10 MG/ML IV BOLUS
INTRAVENOUS | Status: DC | PRN
  Administered 2019-02-24 (×2): 20 mg via INTRAVENOUS
  Administered 2019-02-24: 10 mg via INTRAVENOUS
  Administered 2019-02-24: 20 mg via INTRAVENOUS
  Administered 2019-02-24: 10 mg via INTRAVENOUS
  Administered 2019-02-24: 30 mg via INTRAVENOUS
  Administered 2019-02-24: 20 mg via INTRAVENOUS

## 2019-02-24 MED ORDER — SUCCINYLCHOLINE CHLORIDE 200 MG/10ML IV SOSY
PREFILLED_SYRINGE | INTRAVENOUS | Status: AC
  Filled 2019-02-24: qty 10

## 2019-02-24 MED ORDER — BUPIVACAINE HCL 0.5 % IJ SOLN: 50.0000 mL | Freq: Once | INTRAMUSCULAR | Status: DC

## 2019-02-24 MED ORDER — CEFAZOLIN SODIUM-DEXTROSE 2-4 GM/100ML-% IV SOLN
2.0000 g | INTRAVENOUS | Status: AC
  Administered 2019-02-24: 2 g via INTRAVENOUS

## 2019-02-24 MED ORDER — LIDOCAINE 2% (20 MG/ML) 5 ML SYRINGE
INTRAMUSCULAR | Status: AC
  Filled 2019-02-24: qty 5

## 2019-02-24 MED ORDER — POVIDONE-IODINE 10 % EX SWAB: 2.0000 "application " | Freq: Once | CUTANEOUS | Status: DC

## 2019-02-24 MED ORDER — FENTANYL CITRATE (PF) 100 MCG/2ML IJ SOLN
INTRAMUSCULAR | Status: AC
  Filled 2019-02-24: qty 2

## 2019-02-24 MED ORDER — CHLORHEXIDINE GLUCONATE 4 % EX LIQD: 60.0000 mL | Freq: Once | CUTANEOUS | Status: DC

## 2019-02-24 MED ORDER — HYDROCODONE-ACETAMINOPHEN 5-325 MG PO TABS: 1.0000 | ORAL_TABLET | ORAL | 0 refills | Status: DC | PRN

## 2019-02-24 MED ORDER — PROPOFOL 1000 MG/100ML IV EMUL
INTRAVENOUS | Status: AC
  Filled 2019-02-24: qty 100

## 2019-02-24 MED ORDER — CEFAZOLIN SODIUM 1 G IJ SOLR
INTRAMUSCULAR | Status: AC
  Filled 2019-02-24: qty 20

## 2019-02-24 MED ORDER — TETANUS-DIPHTH-ACELL PERTUSSIS 5-2.5-18.5 LF-MCG/0.5 IM SUSP
0.5000 mL | Freq: Once | INTRAMUSCULAR | Status: AC
  Administered 2019-02-24: 0.5 mL via INTRAMUSCULAR
  Filled 2019-02-24: qty 0.5

## 2019-02-24 MED ORDER — LIDOCAINE HCL 2 % IJ SOLN
20.0000 mL | Freq: Once | INTRAMUSCULAR | Status: AC
  Administered 2019-02-24: 400 mg
  Filled 2019-02-24: qty 20

## 2019-02-24 MED ORDER — BUPIVACAINE HCL (PF) 0.5 % IJ SOLN
30.0000 mL | Freq: Once | INTRAMUSCULAR | Status: AC
  Administered 2019-02-24: 30 mL
  Filled 2019-02-24: qty 30

## 2019-02-24 SURGICAL SUPPLY — 85 items
APL SKNCLS STERI-STRIP NONHPOA (GAUZE/BANDAGES/DRESSINGS)
BANDAGE ACE 3X5.8 VEL STRL LF (GAUZE/BANDAGES/DRESSINGS) ×3 IMPLANT
BANDAGE ACE 4X5 VEL STRL LF (GAUZE/BANDAGES/DRESSINGS) IMPLANT
BANDAGE ELASTIC 4 VELCRO ST LF (GAUZE/BANDAGES/DRESSINGS) ×2 IMPLANT
BENZOIN TINCTURE PRP APPL 2/3 (GAUZE/BANDAGES/DRESSINGS) IMPLANT
BLADE CLIPPER SURG (BLADE) IMPLANT
BNDG CMPR 9X4 STRL LF SNTH (GAUZE/BANDAGES/DRESSINGS) ×1
BNDG COHESIVE 4X5 TAN STRL (GAUZE/BANDAGES/DRESSINGS) ×3 IMPLANT
BNDG COHESIVE 6X5 TAN STRL LF (GAUZE/BANDAGES/DRESSINGS) ×6 IMPLANT
BNDG ESMARK 4X9 LF (GAUZE/BANDAGES/DRESSINGS) ×2 IMPLANT
BNDG GAUZE ELAST 4 BULKY (GAUZE/BANDAGES/DRESSINGS) ×5 IMPLANT
BNDG GAUZE STRTCH 6 (GAUZE/BANDAGES/DRESSINGS) ×3 IMPLANT
CAP PIN PROTECTOR ORTHO WHT (CAP) ×2 IMPLANT
CLOSURE WOUND 1/2 X4 (GAUZE/BANDAGES/DRESSINGS)
COVER SURGICAL LIGHT HANDLE (MISCELLANEOUS) ×3 IMPLANT
COVER WAND RF STERILE (DRAPES) ×3 IMPLANT
CUFF TOURNIQUET SINGLE 18IN (TOURNIQUET CUFF) IMPLANT
CUFF TOURNIQUET SINGLE 24IN (TOURNIQUET CUFF) IMPLANT
CUFF TOURNIQUET SINGLE 34IN LL (TOURNIQUET CUFF) IMPLANT
CUFF TOURNIQUET SINGLE 44IN (TOURNIQUET CUFF) IMPLANT
DRAPE OEC MINIVIEW 54X84 (DRAPES) ×3 IMPLANT
DRAPE U-SHAPE 47X51 STRL (DRAPES) ×3 IMPLANT
DRSG EMULSION OIL 3X3 NADH (GAUZE/BANDAGES/DRESSINGS) IMPLANT
DURAPREP 26ML APPLICATOR (WOUND CARE) ×3 IMPLANT
ELECT REM PT RETURN 9FT ADLT (ELECTROSURGICAL)
ELECTRODE REM PT RTRN 9FT ADLT (ELECTROSURGICAL) IMPLANT
GAUZE SPONGE 4X4 12PLY STRL (GAUZE/BANDAGES/DRESSINGS) ×4 IMPLANT
GAUZE XEROFORM 1X8 LF (GAUZE/BANDAGES/DRESSINGS) ×2 IMPLANT
GAUZE XEROFORM 5X9 LF (GAUZE/BANDAGES/DRESSINGS) ×3 IMPLANT
GLOVE BIO SURGEON STRL SZ7.5 (GLOVE) ×2 IMPLANT
GLOVE BIOGEL PI IND STRL 7.0 (GLOVE) ×1 IMPLANT
GLOVE BIOGEL PI IND STRL 8.5 (GLOVE) ×1 IMPLANT
GLOVE BIOGEL PI INDICATOR 7.0 (GLOVE) ×2
GLOVE BIOGEL PI INDICATOR 8.5 (GLOVE) ×2
GLOVE ECLIPSE 7.0 STRL STRAW (GLOVE) ×3 IMPLANT
GLOVE SKINSENSE NS SZ7.5 (GLOVE) ×4
GLOVE SKINSENSE STRL SZ7.5 (GLOVE) ×2 IMPLANT
GLOVE SURG ORTHO 8.0 STRL STRW (GLOVE) ×3 IMPLANT
GOWN STRL REIN XL XLG (GOWN DISPOSABLE) ×6 IMPLANT
GOWN STRL REUS W/ TWL LRG LVL3 (GOWN DISPOSABLE) ×2 IMPLANT
GOWN STRL REUS W/TWL LRG LVL3 (GOWN DISPOSABLE) ×6
HANDPIECE INTERPULSE COAX TIP (DISPOSABLE)
K-WIRE 0.9X102 (Wire) ×12 IMPLANT
KIT BASIN OR (CUSTOM PROCEDURE TRAY) ×3 IMPLANT
KIT TURNOVER KIT B (KITS) ×3 IMPLANT
KWIRE 0.9X102 (Wire) IMPLANT
MANIFOLD NEPTUNE II (INSTRUMENTS) ×3 IMPLANT
NDL HYPO 25GX1X1/2 BEV (NEEDLE) IMPLANT
NEEDLE HYPO 25GX1X1/2 BEV (NEEDLE) ×3 IMPLANT
NS IRRIG 1000ML POUR BTL (IV SOLUTION) ×3 IMPLANT
PACK ORTHO EXTREMITY (CUSTOM PROCEDURE TRAY) ×3 IMPLANT
PAD ABD 8X10 STRL (GAUZE/BANDAGES/DRESSINGS) ×3 IMPLANT
PAD ARMBOARD 7.5X6 YLW CONV (MISCELLANEOUS) ×6 IMPLANT
PAD CAST 4YDX4 CTTN HI CHSV (CAST SUPPLIES) IMPLANT
PADDING CAST ABS 4INX4YD NS (CAST SUPPLIES) ×2
PADDING CAST ABS COTTON 4X4 ST (CAST SUPPLIES) ×1 IMPLANT
PADDING CAST COTTON 4X4 STRL (CAST SUPPLIES) ×3
PADDING CAST COTTON 6X4 STRL (CAST SUPPLIES) ×3 IMPLANT
SET CYSTO W/LG BORE CLAMP LF (SET/KITS/TRAYS/PACK) ×2 IMPLANT
SET HNDPC FAN SPRY TIP SCT (DISPOSABLE) IMPLANT
SOAP 2 % CHG 4 OZ (WOUND CARE) ×3 IMPLANT
SPLINT FIBERGLASS 3X12 (CAST SUPPLIES) ×2 IMPLANT
SPONGE LAP 18X18 RF (DISPOSABLE) ×3 IMPLANT
STOCKINETTE IMPERVIOUS 9X36 MD (GAUZE/BANDAGES/DRESSINGS) ×3 IMPLANT
STRIP CLOSURE SKIN 1/2X4 (GAUZE/BANDAGES/DRESSINGS) IMPLANT
SUCTION FRAZIER TIP 10 FR DISP (SUCTIONS) IMPLANT
SUT ETHILON 2 0 FS 18 (SUTURE) ×3 IMPLANT
SUT ETHILON 2 0 PSLX (SUTURE) IMPLANT
SUT ETHILON 6 0 P 1 (SUTURE) ×2 IMPLANT
SUT FIBERWIRE 3-0 18 TAPR NDL (SUTURE) ×6
SUT PROLENE 3 0 PS 2 (SUTURE) IMPLANT
SUT PROLENE 4 0 P 3 18 (SUTURE) IMPLANT
SUT PROLENE 4 0 PS 2 18 (SUTURE) ×6 IMPLANT
SUT VIC AB 2-0 FS1 27 (SUTURE) ×6 IMPLANT
SUT VICRYL 4-0 PS2 18IN ABS (SUTURE) IMPLANT
SUTURE FIBERWR 3-0 18 TAPR NDL (SUTURE) IMPLANT
SWAB CULTURE ESWAB REG 1ML (MISCELLANEOUS) IMPLANT
TOWEL OR 17X24 6PK STRL BLUE (TOWEL DISPOSABLE) ×3 IMPLANT
TOWEL OR 17X26 10 PK STRL BLUE (TOWEL DISPOSABLE) ×3 IMPLANT
TUBE CONNECTING 12'X1/4 (SUCTIONS) ×1
TUBE CONNECTING 12X1/4 (SUCTIONS) ×2 IMPLANT
TUBING TUR DISP (UROLOGICAL SUPPLIES) ×3 IMPLANT
UNDERPAD 30X30 (UNDERPADS AND DIAPERS) ×6 IMPLANT
WATER STERILE IRR 1000ML POUR (IV SOLUTION) ×3 IMPLANT
YANKAUER SUCT BULB TIP NO VENT (SUCTIONS) ×3 IMPLANT

## 2019-02-24 NOTE — ED Notes (Signed)
Patient transported to X-ray 

## 2019-02-24 NOTE — Discharge Instructions (Signed)
Discharge Instructions  - Keep dressings in place. Do not remove them. - The dressings must stay dry - Take all medication as prescribed. Transition to over the counter pain medication as your pain improves - Keep the hand elevated over the next 48-72 hours to help with pain and swelling - Move all digits not restricted by the dressings regularly to prevent stiffness - Please call to schedule a follow up appointment with Dr. Jeannie Fend and therapy at [phone] for 10 days following surgery - Your pain medication have been send digitally to your pharmacy - Please monitor the blood flow to the tip of the digit and call if there are any concerns regarding its perfusion   Post Anesthesia Home Care Instructions  Activity: Get plenty of rest for the remainder of the day. A responsible individual must stay with you for 24 hours following the procedure.  For the next 24 hours, DO NOT: -Drive a car -Paediatric nurse -Drink alcoholic beverages -Take any medication unless instructed by your physician -Make any legal decisions or sign important papers.  Meals: Start with liquid foods such as gelatin or soup. Progress to regular foods as tolerated. Avoid greasy, spicy, heavy foods. If nausea and/or vomiting occur, drink only clear liquids until the nausea and/or vomiting subsides. Call your physician if vomiting continues.  Special Instructions/Symptoms: Your throat may feel dry or sore from the anesthesia or the breathing tube placed in your throat during surgery. If this causes discomfort, gargle with warm salt water. The discomfort should disappear within 24 hours.  If you had a scopolamine patch placed behind your ear for the management of post- operative nausea and/or vomiting:  1. The medication in the patch is effective for 72 hours, after which it should be removed.  Wrap patch in a tissue and discard in the trash. Wash hands thoroughly with soap and water. 2. You may remove the patch  earlier than 72 hours if you experience unpleasant side effects which may include dry mouth, dizziness or visual disturbances. 3. Avoid touching the patch. Wash your hands with soap and water after contact with the patch.    Regional Anesthesia Blocks  1. Numbness or the inability to move the "blocked" extremity may last from 3-48 hours after placement. The length of time depends on the medication injected and your individual response to the medication. If the numbness is not going away after 48 hours, call your surgeon.  2. The extremity that is blocked will need to be protected until the numbness is gone and the  Strength has returned. Because you cannot feel it, you will need to take extra care to avoid injury. Because it may be weak, you may have difficulty moving it or using it. You may not know what position it is in without looking at it while the block is in effect.  3. For blocks in the legs and feet, returning to weight bearing and walking needs to be done carefully. You will need to wait until the numbness is entirely gone and the strength has returned. You should be able to move your leg and foot normally before you try and bear weight or walk. You will need someone to be with you when you first try to ensure you do not fall and possibly risk injury.  4. Bruising and tenderness at the needle site are common side effects and will resolve in a few days.  5. Persistent numbness or new problems with movement should be communicated to the surgeon  or the [facility] [phone])/ [facility] [phone]).

## 2019-02-24 NOTE — Anesthesia Postprocedure Evaluation (Signed)
Anesthesia Post Note  Patient: Psychiatric nurse  Procedure(s) Performed: IRRIGATION AND DEBRIDEMENT LEFT INDEX FINGER (Left Finger) CLOSED REDUCTION INTERNAL FIXATION MIDDLE PHALANX LEFT INDEX FINGER WITH PERCUTANEOUS PINNING (Left Finger)     Patient location during evaluation: PACU Anesthesia Type: Regional and MAC Level of consciousness: awake and alert Pain management: pain level controlled Vital Signs Assessment: post-procedure vital signs reviewed and stable Respiratory status: spontaneous breathing, nonlabored ventilation, respiratory function stable and patient connected to nasal cannula oxygen Cardiovascular status: stable and blood pressure returned to baseline Postop Assessment: no apparent nausea or vomiting Anesthetic complications: no    Last Vitals:  Vitals:   02/24/19 1959 02/24/19 2014  BP: (!) 105/94 129/83  Pulse: 93 95  Resp: 17 15  Temp: (!) 36.3 C   SpO2: (!) 85% 100%    Last Pain:  Vitals:   02/24/19 1959  TempSrc:   PainSc: 0-No pain                 Elize Pinon S

## 2019-02-24 NOTE — ED Triage Notes (Signed)
Slammed her left index finger today. 2 lacerations noted to the finger. Pt tearful and anxious in traige

## 2019-02-24 NOTE — Transfer of Care (Addendum)
Immediate Anesthesia Transfer of Care Note  Patient: Marie Cabrera  Procedure(s) Performed: IRRIGATION AND DEBRIDEMENT LEFT INDEX FINGER (Left Finger) CLOSED REDUCTION INTERNAL FIXATION MIDDLE PHALANX LEFT INDEX FINGER WITH PERCUTANEOUS PINNING (Left Finger)  Patient Location: PACU  Anesthesia Type:MAC combined with regional for post-op pain  Level of Consciousness: awake, alert  and oriented  Airway & Oxygen Therapy: Patient Spontanous Breathing and Patient connected to nasal cannula oxygen  Post-op Assessment: Report given to RN and Post -op Vital signs reviewed and stable  Post vital signs: Reviewed and stable  Last Vitals:  Vitals Value Taken Time  BP    Temp    Pulse 93 02/24/19 1959  Resp 17 02/24/19 1959  SpO2 85 % 02/24/19 1959  Vitals shown include unvalidated device data.  Last Pain:  Vitals:   02/24/19 1346  TempSrc:   PainSc: 6          Complications: No apparent anesthesia complications

## 2019-02-24 NOTE — ED Provider Notes (Signed)
[facility] EMERGENCY DEPARTMENT Provider Note   CSN: [id number] Arrival date & time: 02/24/19  1246    History   Chief Complaint Chief Complaint  Patient presents with  . Finger Injury    HPI Marie Cabrera is a 23 y.o. female.     HPI   23 year old female presents today with injury to her left index finger.  Patient notes that she had a door closed on her finger.  She notes immediate pain.  She notes difficulty with extending and flexing her DIP.  She notes numbness to the ulnar aspect of the finger, very light sensation to the radial aspect.  Cap refill intact, lacerations as noted in the photos over the middle phalanx and distal phalanx.  She reports she is right-hand dominant, is uncertain when her last tetanus was.  She reports she is approximately [redacted] weeks pregnant.  She has no allergies, and has been drinking water today but no solid foods.  Past Medical History:  Diagnosis Date  . ADHD (attention deficit hyperactivity disorder)   . Amenorrhea    last menses 2007  . Asthma    last ospitalized >4 years ago.  NO recent problesm  . Heart murmur    [facility].  MD 2 d Echo 23012  . Rectal bleeding     Patient Active Problem List   Diagnosis Date Noted  . Alpha+ thalassemia 02/23/2019  . Hx of preeclampsia, prior pregnancy, currently pregnant 02/09/2019  . History of gestational diabetes 02/09/2019  . Supervision of high risk pregnancy, antepartum 02/09/2019  . Asthma 02/09/2019  . Postpartum anemia 06/02/2017  . NSVD (normal spontaneous vaginal delivery) 06/01/2017  . Anemia affecting pregnancy in third trimester 02/27/2017  . Limited prenatal care in second trimester 02/25/2017  . Mild tetrahydrocannabinol (THC) abuse 11/19/2016  . Alcohol-induced mood disorder ([place]) 01/08/2016  . Substance abuse ([place])   . Oppositional defiant disorder 05/22/2012  . Hematochezia     Past Surgical History:  Procedure Laterality Date  .  COLONOSCOPY  03/14/2012   Procedure: COLONOSCOPY;  Surgeon: Oletha Blend, MD;  Location: [facility];  Service: Gastroenterology;  Laterality: N/A;  . NO PAST SURGERIES    . No surgical history       OB History    Gravida  2   Para  1   Term  1   Preterm      AB      Living  1     SAB      TAB      Ectopic      Multiple      Live Births  1            Home Medications    Prior to Admission medications   Medication Sig Start Date End Date Taking? Authorizing Provider  Doxylamine-Pyridoxine (DICLEGIS) 10-10 MG TBEC Take 2 tablets by mouth at bedtime. If symptoms persist, add one tablet in the morning and one in the afternoon Patient taking differently: Take 2 tablets by mouth at bedtime.  02/09/19  Yes Sloan Leiter, MD  metroNIDAZOLE (FLAGYL) 500 MG tablet Take 1 tablet (500 mg total) by mouth 2 (two) times daily. 02/12/19  Yes Sloan Leiter, MD  Prenatal Vit-Fe Phos-FA-Omega (VITAFOL GUMMIES) 3.33-0.333-34.8 MG CHEW Chew 1 tablet by mouth daily. 02/09/19  Yes Sloan Leiter, MD  Blood Pressure KIT 1 kit by Does not apply route once a week. 02/09/19   Sloan Leiter, MD  omeprazole (PRILOSEC) 20 MG capsule Take 1 capsule (20 mg total) by mouth daily. Patient not taking: Reported on 01/09/2019 03/10/18   Zigmund Gottron, NP    Family History Family History  Adopted: Yes  Problem Relation Age of Onset  . Crohn's disease Maternal Aunt   . Colon polyps Maternal Aunt   . Colon cancer Maternal Grandfather   . Alcohol abuse Mother   . Depression Mother   . Drug abuse Mother   . Mental illness Mother   . Alcohol abuse Father   . Drug abuse Father   . Mental illness Sister   . Alcohol abuse Maternal Grandmother   . Depression Maternal Grandmother   . Diabetes Maternal Grandmother   . Kidney disease Maternal Grandmother   . Other Other        Pt is adopted, only knows about maternal family    Social History Social History   Tobacco Use  . Smoking status: Never Smoker   . Smokeless tobacco: Never Used  Substance Use Topics  . Alcohol use: No  . Drug use: Yes    Types: Marijuana    Comment: last use June 2018     Allergies   Patient has no known allergies.   Review of Systems Review of Systems  All other systems reviewed and are negative.    Physical Exam Updated Vital Signs BP 137/80   Pulse 87   Temp 98.3 F (36.8 C) (Oral)   Resp 16   Ht 5' (1.524 m)   LMP 11/16/2018   SpO2 100%   BMI 27.24 kg/m   Physical Exam Vitals signs and nursing note reviewed.  Constitutional:      Appearance: She is well-developed.  HENT:     Head: Normocephalic and atraumatic.  Eyes:     General: No scleral icterus.       Right eye: No discharge.        Left eye: No discharge.     Conjunctiva/sclera: Conjunctivae normal.     Pupils: Pupils are equal, round, and reactive to light.  Neck:     Musculoskeletal: Normal range of motion.     Vascular: No JVD.     Trachea: No tracheal deviation.  Pulmonary:     Effort: Pulmonary effort is normal.     Breath sounds: No stridor.  Neurological:     Mental Status: She is alert and oriented to person, place, and time.     Coordination: Coordination normal.  Psychiatric:        Behavior: Behavior normal.        Thought Content: Thought content normal.        Judgment: Judgment normal.        ED Treatments / Results  Labs (all labs ordered are listed, but only abnormal results are displayed) Labs Reviewed  SARS CORONAVIRUS 2    EKG None  Radiology Dg Finger Index Left  Result Date: 02/24/2019 CLINICAL DATA:  Crush injury EXAM: LEFT INDEX FINGER 2+V COMPARISON:  None. FINDINGS: There is soft tissue swelling about the second digit. There is an acute displaced fracture through the mid shaft of the middle phalanx. There appears to be a laceration involving the palmar aspect of the second digit. IMPRESSION: Acute displaced fracture involving the middle phalanx of the second digit with surrounding  soft tissue swelling and likely associated lacerations. Electronically Signed   By: Constance Holster M.D.   On: 02/24/2019 13:40    Procedures .Nerve Block  Date/Time:  02/24/2019 2:19 PM Performed by: Okey Regal, PA-C Authorized by: Okey Regal, PA-C   Consent:    Consent obtained:  Verbal   Consent given by:  Patient   Risks discussed:  Allergic reaction, infection, nerve damage, swelling, unsuccessful block, pain, intravenous injection and bleeding   Alternatives discussed:  No treatment, delayed treatment and alternative treatment Indications:    Indications:  Pain relief and procedural anesthesia Location:    Body area:  Upper extremity   Laterality:  Left (index finger) Pre-procedure details:    Skin preparation:  2% chlorhexidine Procedure details (see MAR for exact dosages):    Block needle gauge:  25 G   Anesthetic injected:  Lidocaine 2% w/o epi and bupivacaine 0.5% w/o epi   Steroid injected:  None   Additive injected:  None   Injection procedure:  Anatomic landmarks identified, incremental injection, negative aspiration for blood, introduced needle and anatomic landmarks palpated   Paresthesia:  None Post-procedure details:    Dressing:  None   Outcome:  Anesthesia achieved   Patient tolerance of procedure:  Tolerated well, no immediate complications   (including critical care time)  Medications Ordered in ED Medications  chlorhexidine (HIBICLENS) 4 % liquid 4 application (has no administration in time range)  povidone-iodine 10 % swab 2 application (has no administration in time range)  ceFAZolin (ANCEF) IVPB 2g/100 mL premix (has no administration in time range)  lidocaine (XYLOCAINE) 2 % (with pres) injection 400 mg (400 mg Infiltration Given 02/24/19 1416)  Tdap (BOOSTRIX) injection 0.5 mL (0.5 mLs Intramuscular Given 02/24/19 1342)  bupivacaine (MARCAINE) 0.5 % injection 30 mL (30 mLs Infiltration Given 02/24/19 1315)  ceFAZolin (ANCEF) IVPB 2g/100  mL premix (0 g Intravenous Stopped 02/24/19 1445)  propofol (DIPRIVAN) 10 mg/mL bolus/IV push (has no administration in time range)     Initial Impression / Assessment and Plan / ED Course  I have reviewed the triage vital signs and the nursing notes.  Pertinent labs & imaging results that were available during my care of the patient were reviewed by me and considered in my medical decision making (see chart for details).        Labs:   Imaging: DG index left  Consults: Orthopedics  Therapeutics: Ancef, Tdap, lidocaine, bupivacaine  Discharge Meds:   Assessment/Plan: 23 year old female presents today with fracture to her left index finger.  Wound was irrigated, prophylactic antibiotics given, tetanus updated.  Discussed risks of antibiotics and pregnancy patient would like to proceed at this time.  Digital block successful.  Patient will be going to the OR with orthopedic surgery.    Final Clinical Impressions(s) / ED Diagnoses   Final diagnoses:  Open displaced fracture of middle phalanx of left index finger, initial encounter    ED Discharge Orders    None       Francee Gentile 02/24/19 1620    Lacretia Leigh, MD 02/25/19 1530

## 2019-02-24 NOTE — Consult Note (Signed)
Reason for Consult:Finger injury Referring Physician: [name] is an 23 y.o. female.  HPI: Marie Cabrera was trying to get her 23yo to stop playing with a door in their house when he closed it on her left index finger. She had immediate pain and came to the ED for evaluation. X-rays showed a middle phalanx fx and hand surgery was consulted. She is RHD and newly pregnant.  Past Medical History:  Diagnosis Date  . ADHD (attention deficit hyperactivity disorder)   . Amenorrhea    last menses 2007  . Asthma    last ospitalized >4 years ago.  NO recent problesm  . Heart murmur    [facility].  MD 2 d Echo 23012  . Rectal bleeding     Past Surgical History:  Procedure Laterality Date  . COLONOSCOPY  03/14/2012   Procedure: COLONOSCOPY;  Surgeon: Oletha Blend, MD;  Location: [facility];  Service: Gastroenterology;  Laterality: N/A;  . NO PAST SURGERIES    . No surgical history      Family History  Adopted: Yes  Problem Relation Age of Onset  . Crohn's disease Maternal Aunt   . Colon polyps Maternal Aunt   . Colon cancer Maternal Grandfather   . Alcohol abuse Mother   . Depression Mother   . Drug abuse Mother   . Mental illness Mother   . Alcohol abuse Father   . Drug abuse Father   . Mental illness Sister   . Alcohol abuse Maternal Grandmother   . Depression Maternal Grandmother   . Diabetes Maternal Grandmother   . Kidney disease Maternal Grandmother   . Other Other        Pt is adopted, only knows about maternal family    Social History:  reports that she has never smoked. She has never used smokeless tobacco. She reports current drug use. Drug: Marijuana. She reports that she does not drink alcohol.  Allergies: No Known Allergies  Medications: I have reviewed the patient's current medications.  No results found for this or any previous visit (from the past 48 hour(s)).  Dg Finger Index Left  Result Date: 02/24/2019 CLINICAL DATA:  Crush injury  EXAM: LEFT INDEX FINGER 2+V COMPARISON:  None. FINDINGS: There is soft tissue swelling about the second digit. There is an acute displaced fracture through the mid shaft of the middle phalanx. There appears to be a laceration involving the palmar aspect of the second digit. IMPRESSION: Acute displaced fracture involving the middle phalanx of the second digit with surrounding soft tissue swelling and likely associated lacerations. Electronically Signed   By: Constance Holster M.D.   On: 02/24/2019 13:40    Review of Systems  Constitutional: Negative for weight loss.  HENT: Negative for ear discharge, ear pain, hearing loss and tinnitus.   Eyes: Negative for blurred vision, double vision, photophobia and pain.  Respiratory: Negative for cough, sputum production and shortness of breath.   Cardiovascular: Negative for chest pain.  Gastrointestinal: Negative for abdominal pain, nausea and vomiting.  Genitourinary: Negative for dysuria, flank pain, frequency and urgency.  Musculoskeletal: Positive for joint pain (Left index finger). Negative for back pain, falls, myalgias and neck pain.  Neurological: Negative for dizziness, tingling, sensory change, focal weakness, loss of consciousness and headaches.  Endo/Heme/Allergies: Does not bruise/bleed easily.  Psychiatric/Behavioral: Negative for depression, memory loss and substance abuse. The patient is not nervous/anxious.    Blood pressure (!) 150/95, pulse (!) 110, temperature 98.3 F (  36.8 C), temperature source Oral, resp. rate 20, height 5' (1.524 m), last menstrual period 11/16/2018, SpO2 100 %, unknown if currently breastfeeding. Physical Exam  Constitutional: She appears well-developed and well-nourished. No distress.  HENT:  Head: Normocephalic and atraumatic.  Eyes: Conjunctivae are normal. Right eye exhibits no discharge. Left eye exhibits no discharge. No scleral icterus.  Neck: Normal range of motion.  Cardiovascular: Normal rate and  regular rhythm.  Respiratory: Effort normal. No respiratory distress.  Musculoskeletal:     Comments: Left shoulder, elbow, wrist, digits- Laceration to volar P2 of index finger, nontender (s/p digital block), prior to block numbness to ulnar aspect and paresthesias to radial aspect, DIP flexion 0/5, cap refill <2s, no instability, no blocks to motion  Sens  Ax/R/M/U intact  Mot   Ax/ R/ PIN/ M/ AIN/ U intact  Rad 2+  Neurological: She is alert.  Skin: Skin is warm and dry. She is not diaphoretic.  Psychiatric: She has a normal mood and affect. Her behavior is normal.    Assessment/Plan: Left index finger open middle phalanx fx -- To OR this afternoon with Dr. Roney Mansreighton. NPO until then. Anticipate discharge after surgery.    Freeman CaldronMichael J. Jamieson Lisa, PA-C Orthopedic Surgery [phone] 02/24/2019, 2:03 PM

## 2019-02-24 NOTE — Op Note (Signed)
PREOPERATIVE DIAGNOSIS: Left index finger open middle phalanx fracture  POSTOPERATIVE DIAGNOSIS: Left index finger open middle phalanx fracture with FDP laceration  ATTENDING PHYSICIAN: Maudry Mayhew. Jeannie Fend, III, MD who was present and scrubbed for the entire case   ASSISTANT SURGEON: None.   ANESTHESIA: Regional with MAC  SURGICAL PROCEDURES:  1.  Percutaneous pinning of displaced left index finger middle phalanx fracture 2.  Irrigation and debridement of skin and subcutaneous tissue of the open fracture 3.  Repair of FDP tendon in zone 2 4.  Neurolysis of index radial and ulnar digital nerves 5.  Independent interpretation of intraoperative radiographs.  SURGICAL INDICATIONS: Patient is a 23 year old female who presented to the ER today.  Her 45-year-old child closed a door on her finger.  She experienced immediate pain and deformity of the finger as well as a laceration along the volar aspect of the index finger.  Radiographs which were obtained in the ER showed a displaced middle phalanx fracture which was open and communicated with the volar wound.  After discussing treatment options she did agree to proceed with operative fixation of the index finger.  FINDING: Displaced middle phalanx fracture of the index finger.  Near anatomic alignment following closed reduction and percutaneous pin fixation.  Intact radial and ulnar neurovascular bundles.  Complete transection of the FDP in zone 2 with stable repair.  Intact FDS.  DESCRIPTION OF PROCEDURE: The patient was identified in the preoperative holding area where the risk benefits and alternatives of the procedure were discussed with patient.  These include but not limited to infection, bleeding, damage to surrounding structures including blood vessels and nerves, pain, stiffness, malunion, nonunion, need for additional procedures.  Informed consent was obtained at that time the patient's left index finger was marked with a surgical marking pen.   Anesthesia then performed a left upper extremity plexus block.  She was then brought back to operative suite where a timeout was performed identifying the correct patient operative site.  She was positioned supine on the operative table with her hand outstretched on a hand table.  She was induced under MAC sedation and a tourniquet was placed on the upper arm.  The arm was then prepped and draped in usual sterile fashion.  I began with closed reduction and percutaneous pin fixation of the middle phalanx fracture.  With gentle longitudinal traction as well as full flexion through the fracture site near anatomic alignment of the fracture was obtained.  A single 0.035 K wire was placed through the head of the proximal phalanx and into the middle phalanx down the central portion of the bone holding the fracture in alignment.  2 additional 0.035 K wires were placed both radially and ulnarly at the base of the middle phalanx and advanced up the shaft of the middle phalanx.  These were advanced into the subchondral bone distally.  The central pin placed through the proximal phalanx was removed and there was maintained, stable alignment of the fracture on both PA and lateral fluoroscopic images.  At this point the limb was then exsanguinated and the tourniquet was inflated.  There were 2 transverse lacerations in the volar aspect of the index finger 1 over the PIP flexion crease and 1 over the DIP flexion crease.  The lacerations were connected along the ulnar aspect creating a flap which could be elevated.  The proximal laceration was then extended in a Bruner type fashion creating a additional skin flap.  Once skin flaps were elevated began dissecting out  both the radial and ulnar digital nerves performing the digital nerve neurolysis.  Careful dissection was performed beginning with the ulnar digital nerve.  This was found in healthy tissue proximally and traced distally and found to be intact throughout its  entirety.  Attention was then turned to the radial digital nerve which was initially found within the bed of the wound.  Proximal and distal dissection along the nerve were performed carefully and it was found to be intact in its entirety.  The digital arteries were also visualized and found to be fully intact.  The wound at this point was then copiously irrigated with normal saline.  The skin and subcutaneous tissues were gently debrided sharply with pickups and scissors.    Upon further dissection deeply the flexor tendon sheath was visualized and there is found to be a complete, transverse laceration through the FDP tendon just distal to the A4 pulley.  The deep FDS tendon was visualized and found to be intact.  Dissection proximally along the flexor tendon sheath found to the proximal tendon edge at the A3 pulley.  This was gently opened and the tendon edge was retracted into the wound.  A 4-0 Prolene suture was carefully passed through the end of it to serve as a traction stitch.  A small portion of the 4 pulley was released and a small hemostat was passed within the flexor tendon sheath and exiting just proximal to the A4 pulley.  A 4-0 Prolene suture was then advanced through the flexor tendon sheath pulling the proximal portion of the FDP near the distal stump.  The 2 ends were approximated.  A modified Kessler suture using 3-0 FiberWire was used to coapt the 2 tendon ends.  A second horizontal mattress suture with 3-0 FiberWire was also placed.  This resulted in a 4 strand repair.  A subsequent running, 6-0 nylon epitendinous suture was then placed.  Following repair of the tendon was no gapping at the laceration site.  At this point the wound was then copiously irrigated with normal saline.  The tourniquet was released and hemostasis was achieved.  The patient had return of brisk capillary refill to all of the digits.  The wound was closed with interrupted 4-0 Prolene sutures.  Xeroform, 4 x 4's and  a well-padded dorsal blocking splint were then placed.  Patient was awoken from her sedation and taken to the PACU in stable condition.  She tolerated the procedure well and there were no complications.  RADIOGRAPHIC INTERPRETATION: 2 views of the left index finger including PA and lateral images show anatomic alignment of the previous middle phalanx fracture.  There are 2 traversing K wires antegrade within the central portion of the bone.  No other fractures or dislocations are noted  ESTIMATED BLOOD LOSS: 20 mL's  TOURNIQUET TIME: Less than 45 minutes  SPECIMENS: None  POSTOPERATIVE PLAN: The patient will be discharged home and seen back  in the office in approximately 10-12 days for wound check, suture  removal, and then be sent to a therapist for zone 2 flexor tendon protocol  IMPLANTS: 0.035 K wires x2

## 2019-02-24 NOTE — Anesthesia Procedure Notes (Signed)
Anesthesia Regional Block: Axillary brachial plexus block   Pre-Anesthetic Checklist: ,, timeout performed, Correct Patient, Correct Site, Correct Laterality, Correct Procedure, Correct Position, site marked, Risks and benefits discussed,  Surgical consent,  Pre-op evaluation,  At surgeon's request and post-op pain management  Laterality: Left  Prep: chloraprep       Needles:  Injection technique: Single-shot  Needle Type: Echogenic Needle     Needle Length: 9cm  Needle Gauge: 21     Additional Needles:   Procedures:, nerve stimulator,,, ultrasound used (permanent image in chart),,,,   Nerve Stimulator or Paresthesia:  Response: MC, median, radial and ulnar responses, 0.5 mA,   Additional Responses:   Narrative:  Start time: 02/24/2019 5:32 PM End time: 02/24/2019 5:39 PM Injection made incrementally with aspirations every 5 mL.  Performed by: Personally  Anesthesiologist: Suzette Battiest, MD

## 2019-02-24 NOTE — Anesthesia Procedure Notes (Signed)
Procedure Name: MAC Date/Time: 02/24/2019 5:53 PM Performed by: Barrington Ellison, CRNA Pre-anesthesia Checklist: Patient identified, Emergency Drugs available, Suction available, Patient being monitored and Timeout performed Patient Re-evaluated:Patient Re-evaluated prior to induction Oxygen Delivery Method: Simple face mask

## 2019-02-24 NOTE — Addendum Note (Signed)
Addendum  created 02/24/19 2038 by Myrtie Soman, MD   Clinical Note Signed, Delete clinical note

## 2019-02-24 NOTE — ED Notes (Signed)
Wound care has been done to finger.

## 2019-02-24 NOTE — Anesthesia Preprocedure Evaluation (Signed)
Anesthesia Evaluation  Patient identified by MRN, date of birth, ID band Patient awake    Reviewed: Allergy & Precautions, NPO status , Patient's Chart, lab work & pertinent test results  Airway Mallampati: II  TM Distance: >3 FB     Dental  (+) Dental Advisory Given   Pulmonary asthma ,    breath sounds clear to auscultation       Cardiovascular negative cardio ROS   Rhythm:Regular Rate:Normal     Neuro/Psych negative neurological ROS     GI/Hepatic negative GI ROS, Neg liver ROS,   Endo/Other  negative endocrine ROS  Renal/GU negative Renal ROS     Musculoskeletal   Abdominal   Peds  Hematology negative hematology ROS (+)   Anesthesia Other Findings   Reproductive/Obstetrics (+) Pregnancy                             Anesthesia Physical Anesthesia Plan  ASA: II  Anesthesia Plan: MAC and Regional   Post-op Pain Management:    Induction: Intravenous  PONV Risk Score and Plan: 2 and Propofol infusion and Treatment may vary due to age or medical condition  Airway Management Planned: Natural Airway and Simple Face Mask  Additional Equipment:   Intra-op Plan:   Post-operative Plan:   Informed Consent: I have reviewed the patients History and Physical, chart, labs and discussed the procedure including the risks, benefits and alternatives for the proposed anesthesia with the patient or authorized representative who has indicated his/her understanding and acceptance.       Plan Discussed with: Surgeon and CRNA  Anesthesia Plan Comments:         Anesthesia Quick Evaluation

## 2019-02-24 NOTE — Progress Notes (Signed)
Orthopedic Tech Progress Note Patient Details:  Marie Cabrera [birth year] [id number]  Ortho Devices Type of Ortho Device: Arm sling Ortho Device/Splint Location: lue Ortho Device/Splint Interventions: Ordered, Application, Adjustment   Post Interventions Patient Tolerated: Well Instructions Provided: Care of device, Adjustment of device   Karolee Stamps 02/24/2019, 9:26 PM

## 2019-02-24 NOTE — ED Notes (Signed)
Offered to place pt's valuables with security; pt declined, states she would like to keep everything with her in belongings bag

## 2019-02-24 NOTE — ED Notes (Signed)
Pt states she is [redacted] weeks pregnant; EDP aware

## 2019-02-24 NOTE — Progress Notes (Signed)
RN spoke with [facility] RN, asked if patient needed a post-op FHR doppler at [redacted] weeks gestation.  RR RN said that it is required at [redacted] weeks gestation and after.  No order for FHR doppler has been placed, patient is stable, VS WNL, A&O, and states she feels good. Will DC home accordingly.

## 2019-02-24 NOTE — Anesthesia Postprocedure Evaluation (Deleted)
Anesthesia Post Note  Patient: Psychiatric nurse  Procedure(s) Performed: IRRIGATION AND DEBRIDEMENT LEFT INDEX FINGER (Left Finger) CLOSED REDUCTION INTERNAL FIXATION MIDDLE PHALANX LEFT INDEX FINGER WITH PERCUTANEOUS PINNING (Left Finger)     Patient location during evaluation: PACU Anesthesia Type: General Level of consciousness: awake and alert Pain management: pain level controlled Vital Signs Assessment: post-procedure vital signs reviewed and stable Respiratory status: spontaneous breathing, nonlabored ventilation, respiratory function stable and patient connected to nasal cannula oxygen Cardiovascular status: blood pressure returned to baseline and stable Postop Assessment: no apparent nausea or vomiting Anesthetic complications: no    Last Vitals:  Vitals:   02/24/19 1959 02/24/19 2014  BP: (!) 105/94 129/83  Pulse: 93 95  Resp: 17 15  Temp: (!) 36.3 C   SpO2: (!) 85% 100%    Last Pain:  Vitals:   02/24/19 1959  TempSrc:   PainSc: 0-No pain                 Jennife Zaucha S

## 2019-02-25 ENCOUNTER — Encounter (HOSPITAL_COMMUNITY): Payer: Self-pay | Admitting: Orthopaedic Surgery

## 2019-03-09 ENCOUNTER — Ambulatory Visit (INDEPENDENT_AMBULATORY_CARE_PROVIDER_SITE_OTHER): Payer: Medicaid Other | Admitting: Advanced Practice Midwife

## 2019-03-09 VITALS — BP 141/67 | HR 96

## 2019-03-09 DIAGNOSIS — O4692 Antepartum hemorrhage, unspecified, second trimester: Secondary | ICD-10-CM

## 2019-03-09 DIAGNOSIS — O0992 Supervision of high risk pregnancy, unspecified, second trimester: Secondary | ICD-10-CM | POA: Diagnosis not present

## 2019-03-09 DIAGNOSIS — O10912 Unspecified pre-existing hypertension complicating pregnancy, second trimester: Secondary | ICD-10-CM

## 2019-03-09 DIAGNOSIS — O0990 Supervision of high risk pregnancy, unspecified, unspecified trimester: Secondary | ICD-10-CM

## 2019-03-09 DIAGNOSIS — Z3A16 16 weeks gestation of pregnancy: Secondary | ICD-10-CM | POA: Diagnosis not present

## 2019-03-09 MED ORDER — BLOOD PRESSURE MONITORING KIT: 1.0000 | PACK | 0 refills | Status: DC

## 2019-03-09 MED ORDER — LABETALOL HCL 200 MG PO TABS: 200.0000 mg | ORAL_TABLET | Freq: Two times a day (BID) | ORAL | 3 refills | Status: DC

## 2019-03-09 NOTE — Progress Notes (Signed)
TELEHEALTH VIRTUAL OBSTETRICS VISIT ENCOUNTER NOTE  I connected with Marie Cabrera on 03/09/19 at  2:35 PM EDT by telephone at home and verified that I am speaking with the correct person using two identifiers.   I discussed the limitations, risks, security and privacy concerns of performing an evaluation and management service by telephone and the availability of in person appointments. I also discussed with the patient that there may be a patient responsible charge related to this service. The patient expressed understanding and agreed to proceed.  Subjective:  Marie Cabrera is a 23 y.o. G2P1001 at 54w1dbeing followed for ongoing prenatal care.  She is currently monitored for the following issues for this high-risk pregnancy and has Hematochezia; Oppositional defiant disorder; Alcohol-induced mood disorder ([place]; Substance abuse ([place]; Mild tetrahydrocannabinol (THC) abuse; Limited prenatal care in second trimester; Anemia affecting pregnancy in third trimester; NSVD (normal spontaneous vaginal delivery); Postpartum anemia; Hx of preeclampsia, prior pregnancy, currently pregnant; History of gestational diabetes; Supervision of high risk pregnancy, antepartum; Asthma; Alpha+ thalassemia; and Chronic hypertension complicating or reason for care during pregnancy, second trimester on their problem list.  Patient reports light vaginal bleeding x 2-3 days. Reports fetal movement. Denies any contractions, bleeding or leaking of fluid.   The following portions of the patient's history were reviewed and updated as appropriate: allergies, current medications, past family history, past medical history, past social history, past surgical history and problem list.   Objective:   BP (!) 141/67   Pulse 96   LMP 11/16/2018    General:  Alert, oriented and cooperative.   Mental Status: Normal mood and affect perceived. Normal judgment and thought content.  Rest of physical exam deferred due to  type of encounter  Assessment and Plan:  Pregnancy: G2P1001 at 148w1d1. Supervision of high risk pregnancy, antepartum --Pt reports good fetal movement, denies cramping, LOF, or vaginal bleeding --Anticipatory guidance about next visits/weeks of pregnancy given. --Reviewed safety, visitor policy, reassurance about COVID-19 for pregnancy at this time. Discussed possible changes to visits, including televisits, that may occur due to COVID-19.  The office remains open if pt needs to be seen and MAU is open 24 hours/day for OB emergencies.   --Resent BP kit, pt has new address.  Pt was able to use borrowed BP cuff today.  - Blood Pressure Monitoring KIT; 1 kit by Does not apply route once a week.  Dispense: 1 kit; Refill: 0  2. Chronic hypertension complicating or reason for care during pregnancy, second trimester --BP elevated at NOB at 12 weeks and elevated today.  No severe range. Pt with hx of severe PEC. Will start labetalol 200 mg BID.   3. Vaginal bleeding in pregnancy, second trimester --Pt with bleeding 2-3 days ago, resolved then returned today.  Bleeding was light red but is now dark.  No other s/sx.  Feeling fetal movement. --Recommend office visit today or tomorrow to evaluate bleeding, check for infection, etc.  Preterm labor symptoms and general obstetric precautions including but not limited to vaginal bleeding, contractions, leaking of fluid and fetal movement were reviewed in detail with the patient.  I discussed the assessment and treatment plan with the patient. The patient was provided an opportunity to ask questions and all were answered. The patient agreed with the plan and demonstrated an understanding of the instructions. The patient was advised to call back or seek an in-person office evaluation/[facility]  [facility]. Please refer to After Visit Summary for other counseling recommendations.   I provided 15 minutes  of non-face-to-face time during this encounter.  No follow-ups on file.  Future Appointments  Date Time Provider [place]  03/10/2019  3:00 PM Woodroe Mode, MD [place] None  03/30/2019 10:30 AM WH-MFC NURSE [place] MFC-US  03/30/2019 10:30 AM [facility]-MFC [place] 1 WH-MFCUS MFC-US  03/30/2019 11:00 AM [place] GENETIC COUNSELING RM [place] MFC-US    Fatima Blank, [place] for [employer], [place] [facility]

## 2019-03-09 NOTE — Progress Notes (Signed)
S/w pt for televisit, due to issues with her phone; pt reports that since Thursday, she has been having dark red bleeding when she wipes after using the bathroom. Pt states that no pain or cramping is associated with the bleeding.

## 2019-03-09 NOTE — Addendum Note (Signed)
Addended by: Fatima Blank A on: 03/09/2019 04:25 PM   Modules accepted: Orders

## 2019-03-10 ENCOUNTER — Ambulatory Visit (INDEPENDENT_AMBULATORY_CARE_PROVIDER_SITE_OTHER): Payer: Medicaid Other | Admitting: Obstetrics & Gynecology

## 2019-03-10 ENCOUNTER — Other Ambulatory Visit: Payer: Self-pay

## 2019-03-10 VITALS — BP 117/70 | HR 88 | Wt 139.0 lb

## 2019-03-10 DIAGNOSIS — O0990 Supervision of high risk pregnancy, unspecified, unspecified trimester: Secondary | ICD-10-CM

## 2019-03-10 DIAGNOSIS — Z3A16 16 weeks gestation of pregnancy: Secondary | ICD-10-CM

## 2019-03-10 DIAGNOSIS — O4692 Antepartum hemorrhage, unspecified, second trimester: Secondary | ICD-10-CM

## 2019-03-10 DIAGNOSIS — O0992 Supervision of high risk pregnancy, unspecified, second trimester: Secondary | ICD-10-CM

## 2019-03-10 NOTE — Addendum Note (Signed)
Addended by: Tarry Kos on: 03/10/2019 04:10 PM   Modules accepted: Orders

## 2019-03-10 NOTE — Patient Instructions (Signed)
Vaginal Bleeding During Pregnancy, Second Trimester  A small amount of bleeding (spotting) from the vagina is common during pregnancy. Sometimes the bleeding is normal and is not a sign of problems. In some other cases, it is a sign of something serious. Tell your doctor right away if there is any bleeding from your vagina. Follow these instructions at home: Activity  Follow your doctor's instructions about how active you can be.  If needed, make plans for someone to help with your normal activities.  Do not exercise or do activities that take a lot of effort until your doctor says that this is safe.  Do not lift anything that is heavier than 10 lb (4.5 kg) until your doctor says that this is safe.  Do not have sex or orgasms until your doctor says that this is safe. Medicines  Take over-the-counter and prescription medicines only as told by your doctor.  Do not take aspirin. It can cause bleeding. General instructions  Watch your condition for any changes.  Write down: ? The number of pads you use each day. ? How often you change pads. ? How soaked your pads are.  Do not use tampons.  Do not douche.  If you pass any tissue from your vagina, save it to show to your doctor.  Keep all follow-up visits as told by your doctor. This is important. Contact a doctor if:  You have bleeding in the vagina at any time during pregnancy.  You have cramps.  You have a fever that does not get better with medicine. Get help right away if:  You have very bad cramps in your back or belly (abdomen).  You have contractions.  You have chills.  You pass large clots or a lot of tissue from your vagina.  Your bleeding gets worse.  You feel light-headed.  You feel weak.  You pass out (faint).  You are leaking fluid from your vagina.  You have a gush of fluid from your vagina. Summary  Sometimes vaginal bleeding during pregnancy is normal and is not a problem. Sometimes it may  be a sign of something serious.  Tell your doctor about any bleeding from your vagina right away.  Follow your doctor's instructions about how active you can be. You may need someone to help you with your normal activities. This information is not intended to replace advice given to you by your health care provider. Make sure you discuss any questions you have with your health care provider. Document Released: 01/11/2014 Document Revised: 12/16/2018 Document Reviewed: 11/28/2016 Elsevier Patient Education  2020 Elsevier Inc.  

## 2019-03-10 NOTE — Progress Notes (Addendum)
ROB.  C/o dark Brown Vaginal bleeding  When she uses the bathroom x 6 days.

## 2019-03-10 NOTE — Progress Notes (Signed)
   PRENATAL VISIT NOTE  Subjective:  Marie Cabrera is a 23 y.o. G2P1001 at [redacted]w[redacted]d being seen today for ongoing prenatal care.  She is currently monitored for the following issues for this high-risk pregnancy and has Oppositional defiant disorder; Alcohol-induced mood disorder ([place]); Substance abuse ([place]); Mild tetrahydrocannabinol (THC) abuse; NSVD (normal spontaneous vaginal delivery); Hx of preeclampsia, prior pregnancy, currently pregnant; History of gestational diabetes; Supervision of high risk pregnancy, antepartum; Asthma; Alpha+ thalassemia; and Chronic hypertension complicating or reason for care during pregnancy, second trimester on their problem list.  Patient reports bleeding.  Contractions: Not present. Vag. Bleeding: Scant.  Movement: Present. Denies leaking of fluid.   The following portions of the patient's history were reviewed and updated as appropriate: allergies, current medications, past family history, past medical history, past social history, past surgical history and problem list.   Objective:   Vitals:   03/10/19 1530  BP: 117/70  Pulse: 88  Weight: 139 lb (63 kg)    Fetal Status: Fetal Heart Rate (bpm): 143 Fundal Height: 17 cm Movement: Present  Presentation: Undeterminable  General:  Alert, oriented and cooperative. Patient is in no acute distress.  Skin: Skin is warm and dry. No rash noted.   Cardiovascular: Normal heart rate noted  Respiratory: Normal respiratory effort, no problems with respiration noted  Abdomen: Soft, gravid, appropriate for gestational age.  Pain/Pressure: Absent     Pelvic: Cervical exam performed Dilation: Closed Effacement (%): Thick Station: Ballotableslight pink discharge noted  Extremities: Normal range of motion.  Edema: None  Mental Status: Normal mood and affect. Normal behavior. Normal judgment and thought content.   Assessment and Plan:  Pregnancy: G2P1001 at [redacted]w[redacted]d 1. Supervision of high risk pregnancy, antepartum  Reschedule her [place] to this week - AFP, Serum, Open Spina Bifida  Preterm labor symptoms and general obstetric precautions including but not limited to vaginal bleeding, contractions, leaking of fluid and fetal movement were reviewed in detail with the patient. Please refer to After Visit Summary for other counseling recommendations.   Return in about 4 weeks (around 04/07/2019).  Future Appointments  Date Time Provider [place]  03/30/2019 10:30 AM [place] MFC-US  03/30/2019 10:30 AM [place] 1 WH-MFCUS MFC-US  03/30/2019 11:00 AM [place] GENETIC COUNSELING RM [place] MFC-US  04/07/2019  2:30 PM Donnamae Jude, MD [facility] None    Emeterio Reeve, MD

## 2019-03-12 ENCOUNTER — Encounter (HOSPITAL_COMMUNITY): Payer: Self-pay

## 2019-03-12 ENCOUNTER — Ambulatory Visit (HOSPITAL_COMMUNITY)
Admission: RE | Admit: 2019-03-12 | Discharge: 2019-03-12 | Disposition: A | Discharge status: Home / Self Care | Payer: Medicaid Other | Source: Ambulatory Visit | Attending: Obstetrics and Gynecology | Admitting: Obstetrics and Gynecology

## 2019-03-12 ENCOUNTER — Ambulatory Visit (HOSPITAL_COMMUNITY): Payer: Medicaid Other | Admitting: *Deleted

## 2019-03-12 ENCOUNTER — Other Ambulatory Visit: Payer: Self-pay

## 2019-03-12 VITALS — BP 131/69 | HR 91 | Temp 98.5°F

## 2019-03-12 DIAGNOSIS — Z3A16 16 weeks gestation of pregnancy: Secondary | ICD-10-CM

## 2019-03-12 DIAGNOSIS — O26892 Other specified pregnancy related conditions, second trimester: Secondary | ICD-10-CM | POA: Diagnosis not present

## 2019-03-12 DIAGNOSIS — O10919 Unspecified pre-existing hypertension complicating pregnancy, unspecified trimester: Secondary | ICD-10-CM | POA: Diagnosis present

## 2019-03-12 DIAGNOSIS — O10019 Pre-existing essential hypertension complicating pregnancy, unspecified trimester: Secondary | ICD-10-CM | POA: Diagnosis not present

## 2019-03-12 DIAGNOSIS — O4692 Antepartum hemorrhage, unspecified, second trimester: Secondary | ICD-10-CM

## 2019-03-12 DIAGNOSIS — O0990 Supervision of high risk pregnancy, unspecified, unspecified trimester: Secondary | ICD-10-CM | POA: Diagnosis not present

## 2019-03-13 LAB — AFP, SERUM, OPEN SPINA BIFIDA
AFP MoM: 1.37
AFP Value: 53 ng/mL
Gest. Age on Collection Date: 16.2 weeks
Maternal Age At EDD: 24.7 yr
OSBR Risk 1 IN: 7840
Test Results:: NEGATIVE
Weight: 139 [lb_av]

## 2019-03-30 ENCOUNTER — Ambulatory Visit (HOSPITAL_COMMUNITY): Payer: Medicaid Other

## 2019-04-07 ENCOUNTER — Ambulatory Visit (INDEPENDENT_AMBULATORY_CARE_PROVIDER_SITE_OTHER): Payer: Medicaid Other | Admitting: Family Medicine

## 2019-04-07 VITALS — BP 139/84 | HR 94

## 2019-04-07 DIAGNOSIS — N760 Acute vaginitis: Secondary | ICD-10-CM

## 2019-04-07 DIAGNOSIS — O09292 Supervision of pregnancy with other poor reproductive or obstetric history, second trimester: Secondary | ICD-10-CM

## 2019-04-07 DIAGNOSIS — B9689 Other specified bacterial agents as the cause of diseases classified elsewhere: Secondary | ICD-10-CM

## 2019-04-07 DIAGNOSIS — O0990 Supervision of high risk pregnancy, unspecified, unspecified trimester: Secondary | ICD-10-CM

## 2019-04-07 DIAGNOSIS — O0992 Supervision of high risk pregnancy, unspecified, second trimester: Secondary | ICD-10-CM

## 2019-04-07 DIAGNOSIS — O09299 Supervision of pregnancy with other poor reproductive or obstetric history, unspecified trimester: Secondary | ICD-10-CM

## 2019-04-07 DIAGNOSIS — O98512 Other viral diseases complicating pregnancy, second trimester: Secondary | ICD-10-CM

## 2019-04-07 DIAGNOSIS — Z3A20 20 weeks gestation of pregnancy: Secondary | ICD-10-CM

## 2019-04-07 DIAGNOSIS — O10912 Unspecified pre-existing hypertension complicating pregnancy, second trimester: Secondary | ICD-10-CM

## 2019-04-07 MED ORDER — TERCONAZOLE 0.8 % VA CREA: 1.0000 | TOPICAL_CREAM | Freq: Every day | VAGINAL | 0 refills | Status: DC

## 2019-04-07 MED ORDER — METRONIDAZOLE 500 MG PO TABS: 500.0000 mg | ORAL_TABLET | Freq: Two times a day (BID) | ORAL | 0 refills | Status: DC

## 2019-04-07 NOTE — Patient Instructions (Signed)

## 2019-04-07 NOTE — Progress Notes (Signed)
TELEHEALTH OBSTETRICS PRENATAL VIRTUAL VIDEO VISIT ENCOUNTER NOTE  Provider [facility]   I connected with Marie Cabrera on 04/07/19 at  2:30 PM EDT by Doxy Video Encounter at home and verified that I am speaking with the correct person using two identifiers.   I discussed the limitations, risks, security and privacy concerns of performing an evaluation and management service virtually and the availability of in person appointments. I also discussed with the patient that there may be a patient responsible charge related to this service. The patient expressed understanding and agreed to proceed. Subjective:  Marie BarnsCorrina N Cabrera is a 23 y.o. G2P1001 at 6119w2d being seen today for ongoing prenatal care.  She is currently monitored for the following issues for this high-risk pregnancy and has Oppositional defiant disorder; Alcohol-induced mood disorder (HCC); Substance abuse (HCC); Mild tetrahydrocannabinol (THC) abuse; Hx of preeclampsia, prior pregnancy, currently pregnant; History of gestational diabetes; Supervision of high risk pregnancy, antepartum; Asthma; Alpha+ thalassemia; and Chronic hypertension complicating or reason for care during pregnancy, second trimester on their problem list.  Patient reports no complaints.  Contractions: Not present. Vag. Bleeding: None.  Movement: Present. Denies any leaking of fluid.   The following portions of the patient's history were reviewed and updated as appropriate: allergies, current medications, past family history, past medical history, past social history, past surgical history and problem list.   Objective:   Vitals:   04/07/19 1448  BP: 139/84  Pulse: 94    Fetal Status:     Movement: Present     General:  Alert, oriented and cooperative. Patient is in no acute distress.  Respiratory: Normal respiratory effort, no problems with respiration noted  Mental Status: Normal mood and affect. Normal  behavior. Normal judgment and thought content.  Rest of physical exam deferred due to type of encounter  Imaging: [place] Mfm Ob Limited  Result Date: 03/12/2019 ----------------------------------------------------------------------  OBSTETRICS REPORT                       (Signed Final 03/12/2019 02:57 pm) ---------------------------------------------------------------------- Patient Info  ID #:       [id number]                          D.O.B.:  October 31, 1995 (23 yrs)  Name:       Marie Cabrera               Visit Date: 03/12/2019 12:05 pm ---------------------------------------------------------------------- Performed By  Performed By:     Eden Lathearrie Stalter BS      Ref. Address:     [address]                    [facility]                                                             [address]  [place], [place]                                                             [phone]  Attending:        Noralee Spaceavi Shankar MD        [facility]  Referred By:      Adam PhenixJAMES G ARNOLD                    MD ---------------------------------------------------------------------- Orders   #  Description                          Code         Ordered By   1  US MFM OB LIMITED                    [id number]     Scheryl DarterJAMES ARNOLD  ----------------------------------------------------------------------   #  Order #                    Accession #                 Episode #   1  [id number]                  [id number]                  [id number]  ---------------------------------------------------------------------- Indications   Vaginal bleeding in pregnancy, second          O46.92   trimester   Pelvic pain affecting pregnancy in second      O26.892   trimester   Hypertension - Chronic/Pre-existing            O10.019   (Labetalol)(Low Risk NIPS)   Silent Carrier for Alpha thalassemia   [redacted] weeks gestation of  pregnancy                Z3A.16  ---------------------------------------------------------------------- Vital Signs  Weight (lb): 139                               Height:        5'  BMI:         27.14 ---------------------------------------------------------------------- Fetal Evaluation  Num Of Fetuses:         1  Fetal Heart Rate(bpm):  153  Cardiac Activity:       Observed  Presentation:           Cephalic  Placenta:               Anterior  Amniotic Fluid  AFI FV:      Within normal limits  Largest Pocket(cm)                              3.12  Comment:    No placental abruption or previa identified. ---------------------------------------------------------------------- OB History  Gravidity:    2         Term:   1        Prem:   0        SAB:   0  TOP:          0       Ectopic:  0        Living: 1 ---------------------------------------------------------------------- Gestational Age  LMP:           16w 4d        Date:  11/16/18                 EDD:   08/23/19  Best:          16w 4d     Det. By:  LMP  (11/16/18)          EDD:   08/23/19 ---------------------------------------------------------------------- Anatomy  Cranium:               Appears normal         Stomach:                Appears normal, left                                                                        sided  Choroid Plexus:        Appears normal         Bladder:                Appears normal ---------------------------------------------------------------------- Cervix Uterus Adnexa  Cervix  Length:            3.3  cm.  Normal appearance by transabdominal scan.  Uterus  No abnormality visualized.  Left Ovary  Within normal limits.  Right Ovary  Within normal limits.  Cul De Sac  No free fluid seen.  Adnexa  No abnormality visualized. ---------------------------------------------------------------------- Impression  Patient is here for ultrasound evaluation because of vaginal  bleeding. On speculum examination at  your office 2 days  ago, only a pink discharge was noted.  A limited ultrasound study was performed. Amniotic fluid is  normal and good fetal activity is seen. Placenta appears  normal. No evidence of previa. On transabdominal scan, the  cervix looks long and closed. ---------------------------------------------------------------------- Recommendations  An appointment was made for her to return in 4 weeks for  fetal anatomy scan. ----------------------------------------------------------------------                  Noralee Spaceavi Shankar, MD Electronically Signed Final Report   03/12/2019 02:57 pm ----------------------------------------------------------------------   Assessment and Plan:  Pregnancy: G2P1001 at 6026w2d 1. Chronic hypertension complicating or reason for care during pregnancy, second trimester BP is well controlled--not taking her Labetalol or ASA--reinforced this.  2. Hx of preeclampsia, prior pregnancy, currently pregnant Continue ASA-Importance of this as preventive reviewed  3. Supervision of high risk pregnancy, antepartum Re-scheduled anatomy to  8/4.  4. Bacterial  vaginosis Reports vaginal irritation, never picked up or took her flagyl for BV in June--refill given, plus topical yeast treatment. - terconazole (TERAZOL 3) 0.8 % vaginal cream; Place 1 applicator vaginally at bedtime.  Dispense: 20 g; Refill: 0 - metroNIDAZOLE (FLAGYL) 500 MG tablet; Take 1 tablet (500 mg total) by mouth 2 (two) times daily.  Dispense: 14 tablet; Refill: 0  General obstetric precautions including but not limited to vaginal bleeding, contractions, leaking of fluid and fetal movement were reviewed in detail with the patient. I discussed the assessment and treatment plan with the patient. The patient was provided an opportunity to ask questions and all were answered. The patient agreed with the plan and demonstrated an understanding of the instructions. The patient was advised to call back or seek an  in-person office evaluation/go to MAU at [facility] for any urgent or concerning symptoms. Please refer to After Visit Summary for other counseling recommendations.   I provided 8 minutes of face-to-face time during this encounter.  Return in 4 weeks (on 05/05/2019) for virtual, [place].  Future Appointments  Date Time Provider [place]  04/13/2019  9:00 AM [place] MFC-US  04/13/2019  9:00 AM WH-MFC [place] 3 WH-MFCUS MFC-US  04/13/2019 10:00 AM WH-MFC GENETIC COUNSELING RM [place] MFC-US  05/05/2019  1:45 PM Emily Filbert, MD [facility] None    Donnamae Jude, MD [facility] for [employer], [place]

## 2019-04-07 NOTE — Progress Notes (Signed)
I connected with  Marie Cabrera on 04/07/19 by a video enabled telemedicine application and verified that I am speaking with the correct person using two identifiers.   C/o breast leaking milk x 4 days, Vaginal irritation mostly after intercourse or using the bathroom x 1 week.  Denies odor, problems voiding, fever, chills, NV

## 2019-04-13 ENCOUNTER — Ambulatory Visit (HOSPITAL_COMMUNITY): Payer: Medicaid Other

## 2019-05-05 ENCOUNTER — Encounter: Payer: Self-pay | Admitting: Obstetrics

## 2019-05-05 ENCOUNTER — Telehealth (INDEPENDENT_AMBULATORY_CARE_PROVIDER_SITE_OTHER): Payer: Medicaid Other | Admitting: Obstetrics

## 2019-05-05 ENCOUNTER — Other Ambulatory Visit: Payer: Self-pay

## 2019-05-05 DIAGNOSIS — O10912 Unspecified pre-existing hypertension complicating pregnancy, second trimester: Secondary | ICD-10-CM

## 2019-05-05 DIAGNOSIS — Z8632 Personal history of gestational diabetes: Secondary | ICD-10-CM

## 2019-05-05 DIAGNOSIS — O0992 Supervision of high risk pregnancy, unspecified, second trimester: Secondary | ICD-10-CM

## 2019-05-05 DIAGNOSIS — O0990 Supervision of high risk pregnancy, unspecified, unspecified trimester: Secondary | ICD-10-CM

## 2019-05-05 DIAGNOSIS — O09299 Supervision of pregnancy with other poor reproductive or obstetric history, unspecified trimester: Secondary | ICD-10-CM

## 2019-05-05 DIAGNOSIS — O09292 Supervision of pregnancy with other poor reproductive or obstetric history, second trimester: Secondary | ICD-10-CM

## 2019-05-05 DIAGNOSIS — Z3A24 24 weeks gestation of pregnancy: Secondary | ICD-10-CM

## 2019-05-05 NOTE — Progress Notes (Signed)
   TELEHEALTH OBSTETRICS PRENATAL VIRTUAL VIDEO VISIT ENCOUNTER NOTE  Provider location: Center for [employer] [facility] at [place]   I connected with Marie Cabrera on 05/05/19 at  3:30 PM EDT by WebEx OB MyChart Video Encounter at home and verified that I am speaking with the correct person using two identifiers.   I discussed the limitations, risks, security and privacy concerns of performing an evaluation and management service virtually and the availability of in person appointments. I also discussed with the patient that there may be a patient responsible charge related to this service. The patient expressed understanding and agreed to proceed. Subjective:  Marie Cabrera is a 23 y.o. G2P1001 at [redacted]w[redacted]d being seen today for ongoing prenatal care.  She is currently monitored for the following issues for this high-risk pregnancy and has Oppositional defiant disorder; Alcohol-induced mood disorder ([place]); Substance abuse ([place]); Mild tetrahydrocannabinol (THC) abuse; Hx of preeclampsia, prior pregnancy, currently pregnant; History of gestational diabetes; Supervision of high risk pregnancy, antepartum; Asthma; Alpha+ thalassemia; and Chronic hypertension complicating or reason for care during pregnancy, second trimester on their problem list.  Patient reports no complaints.  Contractions: Irritability. Vag. Bleeding: None.  Movement: Present. Denies any leaking of fluid.   The following portions of the patient's history were reviewed and updated as appropriate: allergies, current medications, past family history, past medical history, past social history, past surgical history and problem list.   Objective:  There were no vitals filed for this visit.  Fetal Status:     Movement: Present     General:  Alert, oriented and cooperative. Patient is in no acute distress.  Respiratory: Normal respiratory effort, no problems with respiration noted  Mental Status: Normal mood and affect. Normal  behavior. Normal judgment and thought content.  Rest of physical exam deferred due to type of encounter  Imaging: No results found.  Assessment and Plan:  Pregnancy: G2P1001 at [redacted]w[redacted]d  1. Supervision of high risk pregnancy, antepartum  2. Chronic hypertension complicating or reason for care during pregnancy, second trimester - BP's clinically stable  3. Hx of preeclampsia, prior pregnancy, currently pregnant - taking Baby ASA 81 mg  4. History of gestational diabetes - HgbA1c was 5.4 at ~ [redacted] weeks gestation   Preterm labor symptoms and general obstetric precautions including but not limited to vaginal bleeding, contractions, leaking of fluid and fetal movement were reviewed in detail with the patient. I discussed the assessment and treatment plan with the patient. The patient was provided an opportunity to ask questions and all were answered. The patient agreed with the plan and demonstrated an understanding of the instructions. The patient was advised to call back or seek an in-person office evaluation/go to [facility]U at [facility] for any urgent or concerning symptoms. Please refer to After Visit Summary for other counseling recommendations.   I provided 10 minutes of face-to-face time during this encounter.  Return in about 4 weeks (around 06/02/2019) for [facility] in person.  2 hour OGTT.  Future Appointments  Date Time Provider [place]  05/05/2019  3:30 PM Shelly Bombard, MD [place] None  05/07/2019 12:30 PM [place] NURSE [place] [facility]  05/07/2019 12:30 PM [place], Jessie for [facility] [place] [facility] 05/05/2019

## 2019-05-05 NOTE — Progress Notes (Signed)
I connected with Marie Cabrera on 05/05/19 at  3:30 PM EDT by telephone and verified that I am speaking with the correct person using two identifiers.  Mychart ROB:  Unable to check BP- cuff malfunction. Pt will bring cuff to office. No complaints per pt

## 2019-05-07 ENCOUNTER — Ambulatory Visit (HOSPITAL_COMMUNITY)
Admission: RE | Admit: 2019-05-07 | Discharge: 2019-05-07 | Disposition: A | Discharge status: Home / Self Care | Payer: Medicaid Other | Source: Ambulatory Visit | Attending: Obstetrics and Gynecology | Admitting: Obstetrics and Gynecology

## 2019-05-07 ENCOUNTER — Encounter (HOSPITAL_COMMUNITY): Payer: Self-pay

## 2019-05-07 ENCOUNTER — Other Ambulatory Visit: Payer: Self-pay

## 2019-05-07 ENCOUNTER — Ambulatory Visit (HOSPITAL_COMMUNITY): Payer: Medicaid Other | Admitting: *Deleted

## 2019-05-07 ENCOUNTER — Other Ambulatory Visit (HOSPITAL_COMMUNITY): Payer: Self-pay | Admitting: *Deleted

## 2019-05-07 VITALS — BP 125/61 | HR 97 | Temp 98.9°F

## 2019-05-07 DIAGNOSIS — O0990 Supervision of high risk pregnancy, unspecified, unspecified trimester: Secondary | ICD-10-CM

## 2019-05-07 DIAGNOSIS — O09292 Supervision of pregnancy with other poor reproductive or obstetric history, second trimester: Secondary | ICD-10-CM | POA: Diagnosis not present

## 2019-05-07 DIAGNOSIS — Z3A24 24 weeks gestation of pregnancy: Secondary | ICD-10-CM | POA: Diagnosis not present

## 2019-05-07 DIAGNOSIS — O10012 Pre-existing essential hypertension complicating pregnancy, second trimester: Secondary | ICD-10-CM

## 2019-05-07 DIAGNOSIS — O09299 Supervision of pregnancy with other poor reproductive or obstetric history, unspecified trimester: Secondary | ICD-10-CM | POA: Diagnosis present

## 2019-05-07 DIAGNOSIS — O10919 Unspecified pre-existing hypertension complicating pregnancy, unspecified trimester: Secondary | ICD-10-CM

## 2019-05-21 ENCOUNTER — Encounter (HOSPITAL_COMMUNITY): Payer: Self-pay

## 2019-05-21 ENCOUNTER — Observation Stay (HOSPITAL_COMMUNITY)
Admission: AD | Admit: 2019-05-21 | Discharge: 2019-05-22 | Disposition: A | Discharge status: Home / Self Care | Payer: Medicaid Other | Attending: Obstetrics and Gynecology | Admitting: Obstetrics and Gynecology

## 2019-05-21 ENCOUNTER — Other Ambulatory Visit: Payer: Self-pay

## 2019-05-21 DIAGNOSIS — R51 Headache: Secondary | ICD-10-CM | POA: Insufficient documentation

## 2019-05-21 DIAGNOSIS — R55 Syncope and collapse: Secondary | ICD-10-CM

## 2019-05-21 DIAGNOSIS — J45909 Unspecified asthma, uncomplicated: Secondary | ICD-10-CM | POA: Diagnosis not present

## 2019-05-21 DIAGNOSIS — S3991XA Unspecified injury of abdomen, initial encounter: Secondary | ICD-10-CM

## 2019-05-21 DIAGNOSIS — W19XXXA Unspecified fall, initial encounter: Secondary | ICD-10-CM | POA: Diagnosis present

## 2019-05-21 DIAGNOSIS — R040 Epistaxis: Secondary | ICD-10-CM | POA: Insufficient documentation

## 2019-05-21 DIAGNOSIS — Z20828 Contact with and (suspected) exposure to other viral communicable diseases: Secondary | ICD-10-CM | POA: Diagnosis not present

## 2019-05-21 DIAGNOSIS — E860 Dehydration: Secondary | ICD-10-CM | POA: Insufficient documentation

## 2019-05-21 DIAGNOSIS — O9989 Other specified diseases and conditions complicating pregnancy, childbirth and the puerperium: Secondary | ICD-10-CM | POA: Diagnosis present

## 2019-05-21 DIAGNOSIS — E876 Hypokalemia: Secondary | ICD-10-CM | POA: Insufficient documentation

## 2019-05-21 DIAGNOSIS — Z3A26 26 weeks gestation of pregnancy: Secondary | ICD-10-CM | POA: Insufficient documentation

## 2019-05-21 LAB — RAPID URINE DRUG SCREEN, HOSP PERFORMED
Amphetamines: NOT DETECTED
Barbiturates: NOT DETECTED
Benzodiazepines: NOT DETECTED
Cocaine: NOT DETECTED
Opiates: NOT DETECTED
Tetrahydrocannabinol: POSITIVE — AB

## 2019-05-21 LAB — COMPREHENSIVE METABOLIC PANEL
ALT: 23 U/L (ref 0–44)
AST: 23 U/L (ref 15–41)
Albumin: 2.7 g/dL — ABNORMAL LOW (ref 3.5–5.0)
Alkaline Phosphatase: 86 U/L (ref 38–126)
Anion gap: 10 (ref 5–15)
BUN: <5 mg/dL — ABNORMAL LOW (ref 6–20)
CO2: 21 mmol/L — ABNORMAL LOW (ref 22–32)
Calcium: 8.4 mg/dL — ABNORMAL LOW (ref 8.9–10.3)
Chloride: 104 mmol/L (ref 98–111)
Creatinine, Ser: 0.49 mg/dL (ref 0.44–1.00)
GFR calc Af Amer: >60 mL/min (ref >60)
GFR calc non Af Amer: >60 mL/min (ref >60)
Glucose, Bld: 69 mg/dL — ABNORMAL LOW (ref 70–99)
Potassium: 2.9 mmol/L — ABNORMAL LOW (ref 3.5–5.1)
Sodium: 135 mmol/L (ref 135–145)
Total Bilirubin: 0.4 mg/dL (ref 0.3–1.2)
Total Protein: 5.8 g/dL — ABNORMAL LOW (ref 6.5–8.1)

## 2019-05-21 LAB — URINALYSIS, ROUTINE W REFLEX MICROSCOPIC
Bilirubin Urine: NEGATIVE
Glucose, UA: NEGATIVE mg/dL
Hgb urine dipstick: NEGATIVE
Ketones, ur: 20 mg/dL — AB
Leukocytes,Ua: NEGATIVE
Nitrite: NEGATIVE
Protein, ur: NEGATIVE mg/dL
Specific Gravity, Urine: 1.013 (ref 1.005–1.030)
pH: 7 (ref 5.0–8.0)

## 2019-05-21 LAB — CBC
HCT: 32.5 % — ABNORMAL LOW (ref 36.0–46.0)
Hemoglobin: 10.8 g/dL — ABNORMAL LOW (ref 12.0–15.0)
MCH: 28.2 pg (ref 26.0–34.0)
MCHC: 33.2 g/dL (ref 30.0–36.0)
MCV: 84.9 fL (ref 80.0–100.0)
Platelets: 244 10*3/uL (ref 150–400)
RBC: 3.83 MIL/uL — ABNORMAL LOW (ref 3.87–5.11)
RDW: 13.7 % (ref 11.5–15.5)
WBC: 9.2 10*3/uL (ref 4.0–10.5)
nRBC: 0 % (ref 0.0–0.2)

## 2019-05-21 LAB — PROTEIN / CREATININE RATIO, URINE
Creatinine, Urine: 113.75 mg/dL
Protein Creatinine Ratio: 0.11 mg/mg{Cre} (ref 0.00–0.15)
Total Protein, Urine: 12 mg/dL

## 2019-05-21 LAB — SARS CORONAVIRUS 2 BY RT PCR (HOSPITAL ORDER, PERFORMED IN ~~LOC~~ HOSPITAL LAB): SARS Coronavirus 2: NEGATIVE

## 2019-05-21 MED ORDER — CALCIUM CARBONATE ANTACID 500 MG PO CHEW: 2.0000 | CHEWABLE_TABLET | ORAL | Status: DC | PRN

## 2019-05-21 MED ORDER — PRENATAL MULTIVITAMIN CH
1.0000 | ORAL_TABLET | Freq: Every day | ORAL | Status: DC
  Administered 2019-05-22: 10:00:00 1 via ORAL
  Filled 2019-05-21: qty 1

## 2019-05-21 MED ORDER — ZOLPIDEM TARTRATE 5 MG PO TABS: 5.0000 mg | ORAL_TABLET | Freq: Every evening | ORAL | Status: DC | PRN

## 2019-05-21 MED ORDER — DOCUSATE SODIUM 100 MG PO CAPS
100.0000 mg | ORAL_CAPSULE | Freq: Every day | ORAL | Status: DC
  Administered 2019-05-22: 10:00:00 100 mg via ORAL
  Filled 2019-05-21: qty 1

## 2019-05-21 MED ORDER — ACETAMINOPHEN 325 MG PO TABS
650.0000 mg | ORAL_TABLET | ORAL | Status: DC | PRN
  Administered 2019-05-22: 650 mg via ORAL
  Filled 2019-05-21: qty 2

## 2019-05-21 MED ORDER — BUTALBITAL-APAP-CAFFEINE 50-325-40 MG PO TABS
2.0000 | ORAL_TABLET | Freq: Once | ORAL | Status: AC
  Administered 2019-05-21: 2 via ORAL
  Filled 2019-05-21: qty 2

## 2019-05-21 NOTE — H&P (Signed)
History   CSN: [id number] Arrival date and time: 05/21/19 1657  Chief Complaint  Loss of Consciousness   Marie Cabrera is a 23 yr old G1P1001 female at [redacted]w[redacted]d with a history of heart murmur diagnosed in childhood, chronic hypertension (stopped labetalol one month ago), preeclampsia in her prior pregnancy (stopped aspirin one month ago), and past history of substance abuse, who presents to MAU tonight with several complaints, including headache for a week and a half, feeling weak for two days, intermittent nose bleed for two days, and an episode of syncope today. Marie Cabrera has headaches outside of pregnancy, but the headache she's had for the past 1.5 weeks is worse than normal, a 7/10. She "sleeps off the headaches" and has not taken any medications. She has also had nose bleeds for the past two days, that seem to occur only at night and in the morning. The blood drips from her nose, is bright red, and stops after she applies pressure for about an hour. Denies putting anything in her nose, picking her nose, or recent drug use. Denies history of nose bleeds, easy bleeding or easy bruising. Marie Cabrera also notes feeling weak for two days. As she was getting ready to leave to come to the MAU, she started feeling dizzy and the next thing she remembers, her sister's husband was putting her in the car. Her sister told her that she passed out and fell on her belly. Unsure of whether she hit her head. Denies contractions, decreased fetal movement, vaginal bleeding, abnormal discharge, and leaking of fluid. She was still feeling to weak to walk upon arrival to MAU, so she was pushed in a wheelchair into the hospital. No prior history of syncope or seizures. Denies chest pain, palpitations, SOB. She has a history of heart murmur diagnosed at age 23 She had some dizziness and a normal EKG reported in one of her prenatal notes from previous pregnancy (03/20/17), but no syncope. Marie Cabrera endorses a decreased appetite  lately and has not eaten any food since yesterday. She has only had one bottle of water today.   Of note, Marie Cabrera has a diagnosis of chronic HTN and was put on labetalol. She was also started on baby aspirin due to preeclampsia in her first pregnancy. She stopped taking both medications one month ago, because they made her feel "weird". She is unable to elaborate on what she means by "feeling weird."  Otherwise, denies N/V, dysuria, changes in bowel movements, diarrhea, constipation, and weight loss.          OB History   Gravida 2 Para 1 Term 1 Preterm AB Living 1   Past Medical History:  ADHD (attention deficit hyperactivity disorder)  Amenorrhea  last menses 2007  Asthma  last ospitalized >4 years ago.  NO recent problesm  Heart murmur  [facility].  MD 2 d Echo [id number]  Rectal bleeding      Past Surgical History:   CLOSED REDUCTION FINGER WITH PERCUTANEOUS PINNING Left 02/24/2019  Procedure: CLOSED REDUCTION INTERNAL FIXATION MIDDLE PHALANX LEFT INDEX FINGER WITH PERCUTANEOUS PINNING;  Surgeon: CVerner Mould MD;  [facility]  Service: Orthopedics;  Laterality: Left;  COLONOSCOPY  03/14/2012  Procedure: COLONOSCOPY;  Surgeon: JOletha Blend MD;  [facility]  Service: Gastroenterology;  Laterality: N/A;  I&D EXTREMITY Left 02/24/2019  Procedure: IRRIGATION AND DEBRIDEMENT LEFT INDEX FINGER;  Surgeon: CVerner Mould MD;  [facility]  Service: Orthopedics;  Laterality: Left;   Family  History  Adopted: Yes   Crohn's disease Maternal Aunt  Colon polyps Maternal Aunt  Colon cancer Maternal Grandfather  Alcohol abuse Mother  Depression Mother  Drug abuse Mother  Mental illness Mother  Alcohol abuse Father  Drug abuse Father  Mental illness Sister  Alcohol abuse Maternal Grandmother  Depression Maternal Grandmother  Diabetes Maternal Grandmother  Kidney disease Maternal Grandmother       Pt is adopted, only knows about maternal family   Social History  Tobacco Use  Smoking status:  Never Smoker  Smokeless tobacco: Never Used  Substance Use Topics  Alcohol use: No  Drug use: Yes Types: Marijuana Comment: last use June 2018  Allergies: No Known Allergies  Medications Prior to Admission   Prenatal Vit-Fe Phos-FA-Omega (VITAFOL GUMMIES) 3.33-0.333-34.8 MG CHEW Chew 1 tablet by mouth daily. 90 tablet 5 05/20/2019 at 1800  aspirin EC 81 MG tablet Take 81 mg by mouth daily.  Blood Pressure KIT  Doxylamine-Pyridoxine (DICLEGIS) 10-10 MG TBEC  HYDROcodone-acetaminophen (NORCO/VICODIN) 5-325 MG tablet  labetalol (NORMODYNE) 200 MG tablet Take 1 tablet (200 mg total) by mouth 2 (two) times daily.  metroNIDAZOLE (FLAGYL) 500 MG tablet Take 1 tablet (500 mg total) by mouth 2 (two) times daily. (Patient not taking: Reported on 05/07/2019)  omeprazole (PRILOSEC) 20 MG capsule  terconazole (TERAZOL 3) 0.8 % vaginal cream   Review of Systems   Constitutional: Positive for appetite change and fatigue. Negative for chills, fever and unexpected weight change.   HENT: Positive for nosebleeds.    Respiratory: Negative for shortness of breath.    Cardiovascular: Negative for chest pain and palpitations.   Gastrointestinal: Positive for abdominal pain. Negative for abdominal distention, constipation, diarrhea, nausea and vomiting.   Genitourinary: Positive for frequency and urgency. Negative for decreased urine volume, difficulty urinating, dysuria, vaginal bleeding, vaginal discharge and vaginal pain.   Neurological: Positive for dizziness, syncope, light-headedness and headaches.   Hematological: Does not bruise/bleed easily.    Physical Exam  Blood pressure 135/75, pulse 92, temperature 98.6 F (37 C), resp. rate 18, last menstrual period 11/16/2018, SpO2 100 %, unknown if currently breastfeeding. 135/75 92 99 %  Constitutional: She is oriented to person, place, and time. She appears well-developed and well-nourished. She  appears distressed.  Nose: No mucosal edema or nose lacerations. No epistaxis.  No foreign bodies.  Moist mucus membranes. No dried blood. No active bleeding.   Cardiovascular: Normal rate, regular rhythm and normal heart sounds.  No murmur heard. Respiratory: Effort normal and breath sounds normal. No respiratory distress. She has no wheezes.  GI: Soft. She exhibits no distension. There is abdominal tenderness (Left sided tenderness, mild tenderness over uterine fundus) in the suprapubic area. There is no rigidity and no rebound.  Neurological: She is alert and oriented to person, place, and time.  Skin: Skin is warm and dry. She is not diaphoretic.  Psychiatric: She has a normal mood and affect. Her behavior is normal. Judgment and thought content normal.  NST: reactive, baseline rate ~145 bpm, moderate variability, 15x15 accelerations, mild variable decelerations, TOCO: occasional irritability, no contractions   MAU Course  Procedures MDM  CBC,CMP,UA,UDS,UPCR Orthostatic vitals EKG  Dimas Millin  05/21/2019, 6:07 PM      I confirm that I have verified the information documented in the medical student's note and that I have also personally reperformed the history, physical exam and all medical decision making activities of this service and have verified that all service and findings are  accurately documented in this student's note.   Consult with Dr. Domenic Polite, reviewed EKG, no abnormalities.   Plan for prolonged EFM. Needs K repleted.  Transfer of care given to Marie Cabrera, [place] 05/21/2019 8:15 PM      Assumed care. Had an episode of contractions which later diminished Consulted Dr Elly Modena who recommends 23 hr observation due to direct blow to abdomen.    Assessment and Plan    A:  [place] intrauterine pregnancy at [redacted]w[redacted]d     S/P syncope with fall       Fell onto abdomen       Reassuring fetal heart rate tracing  P;   [facility]       Routine orders       Continuous EFM       Push fluids        MD to follow     Electronically signed by GDimas Millin Medical Student at 05/21/2019  8:02 PM  Electronically signed by BJulianne Handler CNM at 05/21/2019  8:17 PM  Electronically signed by WSeabron Spates CNM at 05/21/2019 11:44 PM             Admission (Current) on 05/21/2019               Revision History               Detailed Report

## 2019-05-21 NOTE — MAU Note (Signed)
Pt reports she was walking to her car and she "passed out. Her sister said that she fell and hit her stomach. Pt stated she has not been feeling well .has had nose bleeds for the past 2 days.  Also c/o mild headache and moderate abd pain and cramping . Denies and vag bleeding or discharge. Good fetal movement reported.

## 2019-05-21 NOTE — Plan of Care (Signed)
  Problem: Education: Goal: Knowledge of General Education information will improve Description: Including pain rating scale, medication(s)/side effects and non-pharmacologic comfort measures Outcome: Completed/Met

## 2019-05-21 NOTE — MAU Provider Note (Addendum)
History     CSN: [id number]  Arrival date and time: 05/21/19 1657  Chief Complaint  Patient presents with  . Loss of Consciousness   Marie Cabrera is a 23 yr old G63P1001 female at [redacted]w[redacted]d with a history of heart murmur diagnosed in childhood, chronic hypertension (stopped labetalol one month ago), preeclampsia in her prior pregnancy (stopped aspirin one month ago), and past history of substance abuse, who presents to MAU tonight with several complaints, including headache for a week and a half, feeling weak for two days, intermittent nose bleed for two days, and an episode of syncope today. Marie Cabrera has headaches outside of pregnancy, but the headache she's had for the past 1.5 weeks is worse than normal, a 7/10. She "sleeps off the headaches" and has not taken any medications. She has also had nose bleeds for the past two days, that seem to occur only at night and in the morning. The blood drips from her nose, is bright red, and stops after she applies pressure for about an hour. Denies putting anything in her nose, picking her nose, or recent drug use. Denies history of nose bleeds, easy bleeding or easy bruising. Marie Cabrera also notes feeling weak for two days. As she was getting ready to leave to come to the MAU, she started feeling dizzy and the next thing she remembers, her sister's husband was putting her in the car. Her sister told her that she passed out and fell on her belly. Unsure of whether she hit her head. Denies contractions, decreased fetal movement, vaginal bleeding, abnormal discharge, and leaking of fluid. She was still feeling to weak to walk upon arrival to MAU, so she was pushed in a wheelchair into the hospital. No prior history of syncope or seizures. Denies chest pain, palpitations, SOB. She has a history of heart murmur diagnosed at age 23 She had some dizziness and a normal EKG reported in one of her prenatal notes from previous pregnancy (03/20/17), but no syncope. Marie Cabrera  endorses a decreased appetite lately and has not eaten any food since yesterday. She has only had one bottle of water today.   Of note, Marie Cabrera has a diagnosis of chronic HTN and was put on labetalol. She was also started on baby aspirin due to preeclampsia in her first pregnancy. She stopped taking both medications one month ago, because they made her feel "weird". She is unable to elaborate on what she means by "feeling weird."  Otherwise, denies N/V, dysuria, changes in bowel movements, diarrhea, constipation, and weight loss.    OB History    Gravida  2   Para  1   Term  1   Preterm      AB      Living  1     SAB      TAB      Ectopic      Multiple      Live Births  1           Past Medical History:  Diagnosis Date  . ADHD (attention deficit hyperactivity disorder)   . Amenorrhea    last menses 2007  . Asthma    last ospitalized >4 years ago.  NO recent problesm  . Heart murmur    [facility].  MD 2 d Echo [id number]  . Rectal bleeding     Past Surgical History:  Procedure Laterality Date  . CLOSED REDUCTION FINGER WITH PERCUTANEOUS PINNING Left 02/24/2019   Procedure: CLOSED REDUCTION INTERNAL FIXATION  MIDDLE PHALANX LEFT INDEX FINGER WITH PERCUTANEOUS PINNING;  Surgeon: Verner Mould, MD;  Location: [place];  Service: Orthopedics;  Laterality: Left;  . COLONOSCOPY  03/14/2012   Procedure: COLONOSCOPY;  Surgeon: Oletha Blend, MD;  Location: [place];  Service: Gastroenterology;  Laterality: N/A;  . I&D EXTREMITY Left 02/24/2019   Procedure: IRRIGATION AND DEBRIDEMENT LEFT INDEX FINGER;  Surgeon: Verner Mould, MD;  Location: [place];  Service: Orthopedics;  Laterality: Left;  . No surgical history      Family History  Adopted: Yes  Problem Relation Age of Onset  . Crohn's disease Maternal Aunt   . Colon polyps Maternal Aunt   . Colon cancer Maternal Grandfather   . Alcohol abuse Mother   . Depression Mother   . Drug abuse Mother    . Mental illness Mother   . Alcohol abuse Father   . Drug abuse Father   . Mental illness Sister   . Alcohol abuse Maternal Grandmother   . Depression Maternal Grandmother   . Diabetes Maternal Grandmother   . Kidney disease Maternal Grandmother   . Other Other        Pt is adopted, only knows about maternal family    Social History   Tobacco Use  . Smoking status: Never Smoker  . Smokeless tobacco: Never Used  Substance Use Topics  . Alcohol use: No  . Drug use: Yes    Types: Marijuana    Comment: last use June 2018    Allergies: No Known Allergies  Medications Prior to Admission  Medication Sig Dispense Refill Last Dose  . Prenatal Vit-Fe Phos-FA-Omega (VITAFOL GUMMIES) 3.33-0.333-34.8 MG CHEW Chew 1 tablet by mouth daily. 90 tablet 5 05/20/2019 at 1800  . aspirin EC 81 MG tablet Take 81 mg by mouth daily.   More than a month at Unknown time  . Blood Pressure KIT 1 kit by Does not apply route once a week. 1 each 0   . Blood Pressure Monitoring KIT 1 kit by Does not apply route once a week. 1 kit 0   . Doxylamine-Pyridoxine (DICLEGIS) 10-10 MG TBEC Take 2 tablets by mouth at bedtime. If symptoms persist, add one tablet in the morning and one in the afternoon (Patient not taking: Reported on 05/05/2019) 100 tablet 5   . HYDROcodone-acetaminophen (NORCO/VICODIN) 5-325 MG tablet Take 1-2 tablets by mouth every 4 (four) hours as needed for moderate pain. (Patient not taking: Reported on 03/09/2019) 30 tablet 0   . labetalol (NORMODYNE) 200 MG tablet Take 1 tablet (200 mg total) by mouth 2 (two) times daily. 60 tablet 3 More than a month at Unknown time  . metroNIDAZOLE (FLAGYL) 500 MG tablet Take 1 tablet (500 mg total) by mouth 2 (two) times daily. (Patient not taking: Reported on 05/07/2019) 14 tablet 0   . omeprazole (PRILOSEC) 20 MG capsule Take 1 capsule (20 mg total) by mouth daily. (Patient not taking: Reported on 01/09/2019) 30 capsule 0   . terconazole (TERAZOL 3) 0.8 % vaginal  cream Place 1 applicator vaginally at bedtime. (Patient not taking: Reported on 05/07/2019) 20 g 0     Review of Systems  Constitutional: Positive for appetite change and fatigue. Negative for chills, fever and unexpected weight change.  HENT: Positive for nosebleeds.   Respiratory: Negative for shortness of breath.   Cardiovascular: Negative for chest pain and palpitations.  Gastrointestinal: Positive for abdominal pain. Negative for abdominal distention, constipation, diarrhea, nausea and vomiting.  Genitourinary: Positive for frequency and urgency. Negative for decreased urine volume, difficulty urinating, dysuria, vaginal bleeding, vaginal discharge and vaginal pain.  Neurological: Positive for dizziness, syncope, light-headedness and headaches.  Hematological: Does not bruise/bleed easily.   Physical Exam   Blood pressure 135/75, pulse 92, temperature 98.6 F (37 C), resp. rate 18, last menstrual period 11/16/2018, SpO2 100 %, unknown if currently breastfeeding. Patient Vitals for the past 24 hrs:  BP Temp Pulse Resp SpO2  05/21/19 2000 - - - - 100 %  05/21/19 1930 - - - - 100 %  05/21/19 1925 135/75 - 92 - 99 %  05/21/19 1924 (!) 145/76 - 84 - -  05/21/19 1923 129/71 - 81 - -  05/21/19 1854 133/79 - 76 - -  05/21/19 1736 134/80 98.6 F (37 C) 88 18 -   Physical Exam  Constitutional: She is oriented to person, place, and time. She appears well-developed and well-nourished. She appears distressed.  HENT:  Nose: No mucosal edema or nose lacerations. No epistaxis.  No foreign bodies.  Moist mucus membranes. No dried blood. No active bleeding.   Cardiovascular: Normal rate, regular rhythm and normal heart sounds.  No murmur heard. Respiratory: Effort normal and breath sounds normal. No respiratory distress. She has no wheezes.  GI: Soft. She exhibits no distension. There is abdominal tenderness (Left sided tenderness, mild tenderness over uterine fundus) in the suprapubic area.  There is no rigidity and no rebound.  Neurological: She is alert and oriented to person, place, and time.  Skin: Skin is warm and dry. She is not diaphoretic.  Psychiatric: She has a normal mood and affect. Her behavior is normal. Judgment and thought content normal.  NST: reactive, baseline rate ~145 bpm, moderate variability, 15x15 accelerations, mild variable decelerations, TOCO: occasional irritability, no contractions  Results for orders placed or performed during the hospital encounter of 05/21/19 (from the past 24 hour(s))  Urinalysis, Routine w reflex microscopic     Status: Abnormal   Collection Time: 05/21/19  5:48 PM  Result Value Ref Range   Color, Urine YELLOW YELLOW   APPearance HAZY (A) CLEAR   Specific Gravity, Urine 1.013 1.005 - 1.030   pH 7.0 5.0 - 8.0   Glucose, UA NEGATIVE NEGATIVE mg/dL   Hgb urine dipstick NEGATIVE NEGATIVE   Bilirubin Urine NEGATIVE NEGATIVE   Ketones, ur 20 (A) NEGATIVE mg/dL   Protein, ur NEGATIVE NEGATIVE mg/dL   Nitrite NEGATIVE NEGATIVE   Leukocytes,Ua NEGATIVE NEGATIVE   RBC / HPF 0-5 0 - 5 RBC/hpf   WBC, UA 0-5 0 - 5 WBC/hpf   Bacteria, UA RARE (A) NONE SEEN   Squamous Epithelial / LPF 6-10 0 - 5   Mucus PRESENT   Protein / creatinine ratio, urine     Status: None   Collection Time: 05/21/19  6:21 PM  Result Value Ref Range   Creatinine, Urine 113.75 mg/dL   Total Protein, Urine 12 mg/dL   Protein Creatinine Ratio 0.11 0.00 - 0.15 mg/mg[Cre]  CBC     Status: Abnormal   Collection Time: 05/21/19  6:45 PM  Result Value Ref Range   WBC 9.2 4.0 - 10.5 K/uL   RBC 3.83 (L) 3.87 - 5.11 MIL/uL   Hemoglobin 10.8 (L) 12.0 - 15.0 g/dL   HCT 32.5 (L) 36.0 - 46.0 %   MCV 84.9 80.0 - 100.0 fL   MCH 28.2 26.0 - 34.0 pg   MCHC 33.2 30.0 - 36.0 g/dL   RDW 13.7  11.5 - 15.5 %   Platelets 244 150 - 400 K/uL   nRBC 0.0 0.0 - 0.2 %  Comprehensive metabolic panel     Status: Abnormal   Collection Time: 05/21/19  6:45 PM  Result Value Ref Range    Sodium 135 135 - 145 mmol/L   Potassium 2.9 (L) 3.5 - 5.1 mmol/L   Chloride 104 98 - 111 mmol/L   CO2 21 (L) 22 - 32 mmol/L   Glucose, Bld 69 (L) 70 - 99 mg/dL   BUN <5 (L) 6 - 20 mg/dL   Creatinine, Ser 0.49 0.44 - 1.00 mg/dL   Calcium 8.4 (L) 8.9 - 10.3 mg/dL   Total Protein 5.8 (L) 6.5 - 8.1 g/dL   Albumin 2.7 (L) 3.5 - 5.0 g/dL   AST 23 15 - 41 U/L   ALT 23 0 - 44 U/L   Alkaline Phosphatase 86 38 - 126 U/L   Total Bilirubin 0.4 0.3 - 1.2 mg/dL   GFR calc non Af Amer >60 >60 mL/min   GFR calc Af Amer >60 >60 mL/min   Anion gap 10 5 - 15  Urine rapid drug screen (hosp performed)     Status: Abnormal   Collection Time: 05/21/19  6:53 PM  Result Value Ref Range   Opiates NONE DETECTED NONE DETECTED   Cocaine NONE DETECTED NONE DETECTED   Benzodiazepines NONE DETECTED NONE DETECTED   Amphetamines NONE DETECTED NONE DETECTED   Tetrahydrocannabinol POSITIVE (A) NONE DETECTED   Barbiturates NONE DETECTED NONE DETECTED   MAU Course  Procedures  MDM CBC CMP UA UDS UPCR Orthostatic vitals EKG  Dimas Millin 05/21/2019, 6:07 PM   I confirm that I have verified the information documented in the medical student's note and that I have also personally reperformed the history, physical exam and all medical decision making activities of this service and have verified that all service and findings are accurately documented in this student's note.  Consult with Dr. Domenic Polite, reviewed EKG, no abnormalities.  Plan for prolonged EFM. Needs K repleted. Transfer of care given to Leslye Peer, [place] 05/21/2019 8:15 PM   Assumed care. Had an episode of contractions which later diminished Consulted Dr Elly Modena who recommends 23 hr observation due to direct blow to abdomen.     Assessment and Plan  A:  S[place] intrauterine pregnancy at [redacted]w[redacted]d     S/P syncope with fall       Fell onto abdomen       Reassuring fetal heart rate tracing   P;   [facility]       Routine orders       Continuous EFM       Push fluids        MD to follow

## 2019-05-21 NOTE — MAU Note (Signed)
Covid swab obtained without difficulty. Pt tol well. No symptoms 

## 2019-05-22 DIAGNOSIS — Z3A26 26 weeks gestation of pregnancy: Secondary | ICD-10-CM

## 2019-05-22 DIAGNOSIS — W19XXXA Unspecified fall, initial encounter: Secondary | ICD-10-CM

## 2019-05-22 DIAGNOSIS — O9A212 Injury, poisoning and certain other consequences of external causes complicating pregnancy, second trimester: Secondary | ICD-10-CM | POA: Diagnosis not present

## 2019-05-22 LAB — TYPE AND SCREEN
ABO/RH(D): B POS
Antibody Screen: NEGATIVE

## 2019-05-22 MED ORDER — SALINE SPRAY 0.65 % NA SOLN
1.0000 | NASAL | Status: DC | PRN
  Administered 2019-05-22: 1 via NASAL
  Filled 2019-05-22: qty 44

## 2019-05-22 MED ORDER — POTASSIUM CHLORIDE CRYS ER 20 MEQ PO TBCR: 40.0000 meq | EXTENDED_RELEASE_TABLET | Freq: Two times a day (BID) | ORAL | 1 refills | Status: DC

## 2019-05-22 MED ORDER — POTASSIUM CHLORIDE CRYS ER 20 MEQ PO TBCR
40.0000 meq | EXTENDED_RELEASE_TABLET | Freq: Two times a day (BID) | ORAL | Status: DC
  Administered 2019-05-22: 40 meq via ORAL
  Filled 2019-05-22: qty 2

## 2019-05-22 NOTE — Plan of Care (Signed)
Discharge instructions discussed with patient. Patient verbalized understanding of hypokalemia education. Discharged home with support person.

## 2019-05-22 NOTE — Progress Notes (Signed)
FACULTY PRACTICE ANTEPARTUM PROGRESS NOTE  Marie Cabrera is a 23 y.o. G2P1001 at 4688w5d who is admitted for abdominal trauma.  Estimated Date of Delivery: 08/23/19 Fetal presentation is unsure.  Length of Stay:  0 Days. Admitted 05/21/2019  Subjective:  Patient reports normal fetal movement.  She denies uterine contractions, denies bleeding and leaking of fluid per vagina. Reports she is somewhat sore but overall feeling okay.  Vitals:  Blood pressure 114/65, pulse 81, temperature 98.1 F (36.7 C), temperature source Oral, resp. rate 18, last menstrual period 11/16/2018, SpO2 100 %, unknown if currently breastfeeding. Physical Examination: CONSTITUTIONAL: Well-developed, well-nourished female in no acute distress.  HENT:  Normocephalic, atraumatic, External right and left ear normal. Oropharynx is clear and moist EYES: Conjunctivae and EOM are normal. Pupils are equal, round, and reactive to light. No scleral icterus.  NECK: Normal range of motion, supple, no masses. SKIN: Skin is warm and dry. No rash noted. Not diaphoretic. No erythema. No pallor. NEUROLGIC: Alert and oriented to person, place, and time. Normal reflexes, muscle tone coordination. No cranial nerve deficit noted. PSYCHIATRIC: Normal mood and affect. Normal behavior. Normal judgment and thought content. CARDIOVASCULAR: Normal heart rate noted RESPIRATORY: Effort normal, no problems with respiration noted MUSCULOSKELETAL: Normal range of motion. No edema and no tenderness. ABDOMEN: Soft, nontender, nondistended, gravid. CERVIX: deferred  Fetal monitoring: FHR: 130 bpm, Variability: moderate, Accelerations: Present, Decelerations: Absent  Uterine activity: no contractions per hour  Results for orders placed or performed during the hospital encounter of 05/21/19 (from the past 48 hour(s))  Urinalysis, Routine w reflex microscopic     Status: Abnormal   Collection Time: 05/21/19  5:48 PM  Result Value Ref Range   Color, Urine YELLOW YELLOW   APPearance HAZY (A) CLEAR   Specific Gravity, Urine 1.013 1.005 - 1.030   pH 7.0 5.0 - 8.0   Glucose, UA NEGATIVE NEGATIVE mg/dL   Hgb urine dipstick NEGATIVE NEGATIVE   Bilirubin Urine NEGATIVE NEGATIVE   Ketones, ur 20 (A) NEGATIVE mg/dL   Protein, ur NEGATIVE NEGATIVE mg/dL   Nitrite NEGATIVE NEGATIVE   Leukocytes,Ua NEGATIVE NEGATIVE   RBC / HPF 0-5 0 - 5 RBC/hpf   WBC, UA 0-5 0 - 5 WBC/hpf   Bacteria, UA RARE (A) NONE SEEN   Squamous Epithelial / LPF 6-10 0 - 5   Mucus PRESENT     Comment: Performed at [facility], [address] [place], [place] [address]  Protein / creatinine ratio, urine     Status: None   Collection Time: 05/21/19  6:21 PM  Result Value Ref Range   Creatinine, Urine 113.75 mg/dL   Total Protein, Urine 12 mg/dL    Comment: NO NORMAL RANGE ESTABLISHED FOR THIS TEST   Protein Creatinine Ratio 0.11 0.00 - 0.15 mg/mg[Cre]    Comment: Performed at [facility], [address] [place], [place] [address]  CBC     Status: Abnormal   Collection Time: 05/21/19  6:45 PM  Result Value Ref Range   WBC 9.2 4.0 - 10.5 K/uL   RBC 3.83 (L) 3.87 - 5.11 MIL/uL   Hemoglobin 10.8 (L) 12.0 - 15.0 g/dL   HCT 09.832.5 (L) 11.936.0 - 14.746.0 %   MCV 84.9 80.0 - 100.0 fL   MCH 28.2 26.0 - 34.0 pg   MCHC 33.2 30.0 - 36.0 g/dL   RDW 82.913.7 56.211.5 - 13.015.5 %   Platelets 244 150 - 400 K/uL   nRBC 0.0 0.0 -  0.2 %    Comment: Performed at [facility], [address], [place], [place] [address]  Comprehensive metabolic panel     Status: Abnormal   Collection Time: 05/21/19  6:45 PM  Result Value Ref Range   Sodium 135 135 - 145 mmol/L   Potassium 2.9 (L) 3.5 - 5.1 mmol/L   Chloride 104 98 - 111 mmol/L   CO2 21 (L) 22 - 32 mmol/L   Glucose, Bld 69 (L) 70 - 99 mg/dL   BUN <5 (L) 6 - 20 mg/dL   Creatinine, Ser 0.93 0.44 - 1.00 mg/dL   Calcium 8.4 (L) 8.9 - 10.3 mg/dL   Total Protein 5.8 (L) 6.5 - 8.1 g/dL   Albumin 2.7 (L) 3.5 - 5.0 g/dL    AST 23 15 - 41 U/L   ALT 23 0 - 44 U/L   Alkaline Phosphatase 86 38 - 126 U/L   Total Bilirubin 0.4 0.3 - 1.2 mg/dL   GFR calc non Af Amer >60 >60 mL/min   GFR calc Af Amer >60 >60 mL/min   Anion gap 10 5 - 15    Comment: Performed at [facility], [address], [place], [place] [address]  Urine rapid drug screen (hosp performed)     Status: Abnormal   Collection Time: 05/21/19  6:53 PM  Result Value Ref Range   Opiates NONE DETECTED NONE DETECTED   Cocaine NONE DETECTED NONE DETECTED   Benzodiazepines NONE DETECTED NONE DETECTED   Amphetamines NONE DETECTED NONE DETECTED   Tetrahydrocannabinol POSITIVE (A) NONE DETECTED   Barbiturates NONE DETECTED NONE DETECTED    Comment: (NOTE) DRUG SCREEN FOR MEDICAL PURPOSES ONLY.  IF CONFIRMATION IS NEEDED FOR ANY PURPOSE, NOTIFY LAB WITHIN 5 DAYS. LOWEST DETECTABLE LIMITS FOR URINE DRUG SCREEN Drug Class                     Cutoff (ng/mL) Amphetamine and metabolites    1000 Barbiturate and metabolites    200 Benzodiazepine                 200 Tricyclics and metabolites     300 Opiates and metabolites        300 Cocaine and metabolites        300 THC                            50 Performed at [facility], [address] [place], [place] [address]   SARS Coronavirus 2 [facility] order, Performed in [facility] hospital lab) Nasopharyngeal Nasopharyngeal Swab     Status: None   Collection Time: 05/21/19 10:26 PM   Specimen: Nasopharyngeal Swab  Result Value Ref Range   SARS Coronavirus 2 NEGATIVE NEGATIVE    Comment: (NOTE) If result is NEGATIVE SARS-CoV-2 target nucleic acids are NOT DETECTED. The SARS-CoV-2 RNA is generally detectable in upper and lower  respiratory specimens during the acute phase of infection. The lowest  concentration of SARS-CoV-2 viral copies this assay can detect is 250  copies / mL. A negative result does not preclude SARS-CoV-2 infection  and should not be used as the sole basis for  treatment or other  patient management decisions.  A negative result may occur with  improper specimen collection / handling, submission of specimen other  than nasopharyngeal swab, presence of viral mutation(s) within the  areas targeted by this assay, and inadequate number  of viral copies  (<250 copies / mL). A negative result must be combined with clinical  observations, patient history, and epidemiological information. If result is POSITIVE SARS-CoV-2 target nucleic acids are DETECTED. The SARS-CoV-2 RNA is generally detectable in upper and lower  respiratory specimens dur ing the acute phase of infection.  Positive  results are indicative of active infection with SARS-CoV-2.  Clinical  correlation with patient history and other diagnostic information is  necessary to determine patient infection status.  Positive results do  not rule out bacterial infection or co-infection with other viruses. If result is PRESUMPTIVE POSTIVE SARS-CoV-2 nucleic acids MAY BE PRESENT.   A presumptive positive result was obtained on the submitted specimen  and confirmed on repeat testing.  While 2019 novel coronavirus  (SARS-CoV-2) nucleic acids may be present in the submitted sample  additional confirmatory testing may be necessary for epidemiological  and / or clinical management purposes  to differentiate between  SARS-CoV-2 and other Sarbecovirus currently known to infect humans.  If clinically indicated additional testing with an alternate test  methodology [phone]) is advised. The SARS-CoV-2 RNA is generally  detectable in upper and lower respiratory sp ecimens during the acute  phase of infection. The expected result is Negative. Fact Sheet for Patients:  StrictlyIdeas.no Fact Sheet for Healthcare Providers: BankingDealers.co.za This test is not yet approved or cleared by the [place] FDA and has been authorized for detection and/or  diagnosis of SARS-CoV-2 by FDA under an Emergency Use Authorization (EUA).  This EUA will remain in effect (meaning this test can be used) for the duration of the COVID-19 declaration under Section 564(b)(1) of the Act, 21 U.S.C. section 360bbb-3(b)(1), unless the authorization is terminated or revoked sooner. Performed at [facility], [place] [address], [place], [place] [address]   Type and screen [place]     Status: None   Collection Time: 05/21/19 11:51 PM  Result Value Ref Range   ABO/RH(D) B POS    Antibody Screen NEG    Sample Expiration      05/24/2019,2359 Performed at [facility], [place] [address], [place], [place] [address]     I have reviewed the patient's current medications.  ASSESSMENT: Active Problems:   Fall   PLAN: Stable since fall, no acute issues Reviewed plan for 24 hrs monitoring Plan for discharge tonight once she is 24 hrs if remains stable   Continue routine antenatal care.   Feliz Beam, M.D. [facility] [employer] (Faculty Practice)  05/22/2019 8:33 AM

## 2019-05-22 NOTE — Discharge Summary (Signed)
Physician Discharge Summary  Patient ID: Marie Cabrera MRN: [id number] DOB/AGE: [birth year] 23 y.o.  Admit date: 05/21/2019 Discharge date: 05/22/2019  Admission Diagnoses: Dehydration Hypokalemia Near syncope Discharge Diagnoses:  Active Problems:   Fall   Discharged Condition: good  Hospital Course: hydrated with potassium, resolution of dizziness, FHR reassuring  Consults:   Significant Diagnostic Studies:   Treatments: observation  Discharge Exam: Blood pressure 121/75, pulse 87, temperature 98.8 F (37.1 C), temperature source Oral, resp. rate 18, last menstrual period 11/16/2018, SpO2 100 %, unknown if currently breastfeeding. General appearance: alert, cooperative and no distress GI: soft, non-tender; bowel sounds normal; no masses,  no organomegaly  Disposition: Discharge disposition: 01-Home or Self Care       Discharge Instructions    Diet - low sodium heart healthy   Complete by: As directed    Increase activity slowly   Complete by: As directed      Allergies as of 05/22/2019   No Known Allergies     Medication List    STOP taking these medications   labetalol 200 MG tablet Commonly known as: NORMODYNE     TAKE these medications   Blood Pressure Monitoring Kit 1 kit by Does not apply route once a week.   omeprazole 20 MG capsule Commonly known as: PRILOSEC Take 1 capsule (20 mg total) by mouth daily.   potassium chloride SA 20 MEQ tablet Commonly known as: K-DUR Take 2 tablets (40 mEq total) by mouth 2 (two) times daily.   Vitafol Gummies 3.33-0.333-34.8 MG Chew Chew 1 tablet by mouth daily.      Follow-up Information    [facility] Follow up.   Specialty: Obstetrics and Gynecology Why: as scheduled already Contact information: [address], [place] [phone]          Signed: Florian Buff 05/22/2019, 8:27 PM

## 2019-05-22 NOTE — Discharge Instructions (Signed)

## 2019-06-01 ENCOUNTER — Ambulatory Visit (HOSPITAL_COMMUNITY): Payer: Medicaid Other

## 2019-06-01 ENCOUNTER — Ambulatory Visit (HOSPITAL_COMMUNITY)
Admission: RE | Admit: 2019-06-01 | Discharge: 2019-06-01 | Disposition: A | Discharge status: Home / Self Care | Payer: Medicaid Other | Source: Ambulatory Visit | Attending: Obstetrics and Gynecology | Admitting: Obstetrics and Gynecology

## 2019-06-01 ENCOUNTER — Encounter (HOSPITAL_COMMUNITY): Payer: Self-pay

## 2019-06-01 ENCOUNTER — Ambulatory Visit (HOSPITAL_COMMUNITY): Payer: Medicaid Other | Admitting: *Deleted

## 2019-06-01 ENCOUNTER — Ambulatory Visit (HOSPITAL_BASED_OUTPATIENT_CLINIC_OR_DEPARTMENT_OTHER): Payer: Medicaid Other | Admitting: Genetic Counselor

## 2019-06-01 ENCOUNTER — Ambulatory Visit (HOSPITAL_COMMUNITY): Payer: Self-pay | Admitting: Genetic Counselor

## 2019-06-01 ENCOUNTER — Other Ambulatory Visit (HOSPITAL_COMMUNITY): Payer: Self-pay | Admitting: *Deleted

## 2019-06-01 ENCOUNTER — Other Ambulatory Visit: Payer: Self-pay

## 2019-06-01 VITALS — BP 135/82 | HR 89 | Temp 98.5°F | Wt 147.6 lb

## 2019-06-01 DIAGNOSIS — Z148 Genetic carrier of other disease: Secondary | ICD-10-CM | POA: Insufficient documentation

## 2019-06-01 DIAGNOSIS — D563 Thalassemia minor: Secondary | ICD-10-CM | POA: Insufficient documentation

## 2019-06-01 DIAGNOSIS — O10919 Unspecified pre-existing hypertension complicating pregnancy, unspecified trimester: Secondary | ICD-10-CM

## 2019-06-01 DIAGNOSIS — O09293 Supervision of pregnancy with other poor reproductive or obstetric history, third trimester: Secondary | ICD-10-CM

## 2019-06-01 DIAGNOSIS — Z315 Encounter for genetic counseling: Secondary | ICD-10-CM | POA: Diagnosis not present

## 2019-06-01 DIAGNOSIS — Z362 Encounter for other antenatal screening follow-up: Secondary | ICD-10-CM | POA: Diagnosis not present

## 2019-06-01 DIAGNOSIS — O10013 Pre-existing essential hypertension complicating pregnancy, third trimester: Secondary | ICD-10-CM | POA: Diagnosis not present

## 2019-06-01 DIAGNOSIS — O0990 Supervision of high risk pregnancy, unspecified, unspecified trimester: Secondary | ICD-10-CM

## 2019-06-01 DIAGNOSIS — Z3689 Encounter for other specified antenatal screening: Secondary | ICD-10-CM | POA: Diagnosis not present

## 2019-06-01 DIAGNOSIS — Z3A28 28 weeks gestation of pregnancy: Secondary | ICD-10-CM

## 2019-06-01 NOTE — Progress Notes (Signed)
06/01/2019  AZELL GASCHLER 1995-11-02 MRN: [id number] DOV: 06/01/2019  Marie Cabrera presented to the [facility] for a genetics consultation regarding her carrier status for alpha-thalassemia. Ms. Lamons came to her appointment alone due to COVID-19 visitor restrictions.   Indication for genetic counseling - Silent carrier for alpha-thalassemia  Prenatal history  Ms. Mcmillon is a G44P1001, 23 y.o. female. Her current pregnancy has completed [redacted]w[redacted]d (Estimated Date of Delivery: 08/23/19).  Ms. Coster denied exposure to environmental toxins or chemical agents. She denied the use of alcohol, tobacco or street drugs. She denied significant viral illnesses, fevers, and bleeding during the course of her pregnancy. Her medical and surgical histories were noncontributory.  Family History  A three generation pedigree was drafted and reviewed. The family history is remarkable for the following:  - Ms. Mcduff reports a personal history of a heart murmur diagnosed in adolescence that did not require surgery. The exact etiology of this congenital heart defect (CHD) is unknown and records are not available. There are multifactorial causes for isolated CHDs, including environmental and genetic factors. As such, the recurrence risk for first degree relatives is elevated over the general population incidence of 1/200 (0.5%). We discussed that without knowing the exact etiology, the risk for a child of an affected mother is approximately 5-6%. A fetal echocardiogram to assess for CHDs in the current pregnancy is recommended.  - Ms. Riskin also reports a personal history of problems learning in school, especially in reading. She reportedly required extra help in school for her learning difficulties. We discussed that many times, learning difficulties are multifactorial in nature, occurring due to a combination of genetic and environmental factorsthat are difficult to  identify.Learning disabilitiescan appear to run in families; thus, there is a chance that Ms. Freshour's children could alsoexperience learning difficulties of some kind. Sheunderstands that she should make the pediatrician aware of any concerns she has about her children'sdevelopment.  - Ms. Pagani has a maternal half brother with sickle cell disease. We discussed that Ms. Cardinal had carrier screening performed for several conditions, including hemoglobinopathies such as sickle cell disease. Her carrier screening did not identify her as a carrier of a hemoglobinopathy; thus, her risk of having a child with sickle cell disease is significantly reduced.  - Ms. Canchola has a maternal aunt whose daughter has two sons with seizures, a daughter with feeding difficulties, and a history of multiple miscarriages. None of these individuals have had genetic testing as far as Ms. Pinos is aware. We discussed that there could possibly be a genetic etiolgoy for these individuals' medical histories. However, without knowing the etiology of these health complications, precise risk assessment is limited.  The remaining family histories were reviewed and found to be noncontributory for birth defects, intellectual disability, recurrent pregnancy loss, and known genetic conditions. Ms. Merriott has limited information about her paternal family history; thus, risk assessment was limited.  The patient's ethnicity is African American. The father of the pregnancy's ethnicity is [place] American and Native [address] Ashkenazi Jewish ancestry and consanguinity were denied. Pedigree will be scanned under Media.  Discussion  Ms. Audino had Horizon-14 carrier screening performed through [place]. The results of the screen identified her as a silent carrier for alpha-thalassemia (aa/a-). Alpha-thalassemia is different in its inheritance compared to other hemoglobinopathies as there are two alpha globin genes on each chromosome  16, or four alpha globin genes total (aa/aa). A person can be a carrier of one alpha gene mutation (aa/a-),  also referred to as a "silent carrier". A person who carries two alpha globin gene mutations can either carry them in cis (both on the same chromosome, denoted as aa/--) or in trans (on different chromosomes, denoted as a-/a-). Alpha-thalassemia carriers of two mutations who have African American ancestry are more likely to have a trans arrangement (a-/a-); cis configuration is reported to be rare in individuals with African American ancestry.     There are several different forms of alpha-thalassemia. The most severe form of alpha-thalassemia, Hb Barts, is associated with an absence of alpha globin chain synthesis as a result of deletions of all four alpha globin genes (--/--).  Given that Ms. Papadopoulos is a silent carrier (aa/a-), her pregnancies would not be at increased risk for Hb Barts, even if her partner is a carrier for alpha-thalassemia. Hemoglobin H (HbH) disease is caused by three deleted or dysfunctioning alpha globin alleles (a-/--) and is characterized by microcytic hypochromic hemolytic anemia, hepatosplenomegaly, mild jaundice, and sometimes thalassemia-like bone changes. Given Ms. Knipp's silent carrier status (aa/a-), the current fetus would only be at risk for HbH disease (a-/--), if her partner is a carrier for two alpha globin mutations in cis (aa/--). If this is the case, the risk for HbH disease in the pregnancy would be 1 in 4 (25%). However, if Ms. Witucki's partner is a carrier for two alpha globin mutations, he would be more likely to carry them in trans configuration (a-/a-) than the cis configuration (aa/--), given his ethnicity. If he is a carrier of alpha-thalassemia in trans, then the pregnancy would not be at increased risk for HbH disease. Based on the carrier frequency for alpha-thalassemia in the African American population, Ms. Rothschild's partner has a 1 in 30 chance of  being any type of carrier for alpha-thalassemia.   Ms. Preslar carrier screening was negative for the other 13 conditions screened. Thus, her risk to be a carrier for these additional conditions (listed separately in the laboratory report) has been reduced but not eliminated. We discussed that carrier testing for alpha-thalassemia via CBC, hemoglobin electrophoresis, and ferritin analysis with reflex to molecular testing is recommended for Ms. Mcmichael's partner. Ms. Blucher indicated that her partner is unlikely to undergo carrier screening.  We also reviewed that Ms. Halberstam had Panorama NIPS through [place] that was low-risk for fetal aneuploidies. We reviewed that these results showed a less than 1 in 10,000 risk for trisomies 21, 18 and 13, and monosomy X (Turner syndrome).  In addition, the risk for triploidy and sex chromosome trisomies (47,XXX and 47,XXY) was also low. Ms. Digioia elected to have cffDNA analysis for 22q11 deletion syndrome, which was also low risk (1 in 9000). We reviewed that while this testing identifies > 99% of pregnancies with trisomy 52, trisomy 78, sex chromosome trisomies (47,XXX and 47,XXY), and triploidy, it is NOT diagnostic. A positive test result requires confirmation by CVS or amniocentesis, and a negative test result does not rule out a fetal chromosome abnormality. She also understands that this testing does not identify all genetic conditions.  A complete ultrasound was performed on 8/27. The ultrasound report was sent under separate cover. There were no visualized fetal anomalies or markers suggestive of aneuploidy. Follow-up ultrasound performed following Ms. Labra's appointment with me today was also normal.  Ms. Yost was also counseled regarding diagnostic testing via amniocentesis. We discussed the technical aspects of the procedure and quoted up to a 1 in 500 (0.2%) risk for preterm labor or other adverse pregnancy  outcomes as a result of amniocentesis.  Cultured cells from an amniocentesis sample allow for the visualization of a fetal karyotype, which can detect >99% of chromosomal aberrations. Chromosomal microarray can also be performed to identify smaller deletions or duplications of fetal chromosomal material. Amniocentesis could also be performed to assess whether the baby is affected by alpha-thalassemia. After careful consideration, Ms. Drozdowski declined amniocentesis at this time. She understands that amniocentesis is available at any point after 16 weeks of pregnancy and that she may opt to undergo the procedure at a later date should she change her mind.  Additional screening and diagnostic testing were declined today. She understands that screening tests, including ultrasound, cannot rule out all birth defects or genetic syndromes. The patient was advised of this limitation and states she still does not want additional testing or screening at this time.   I counseled Ms. Berryman regarding the above risks and available options. The approximate face-to-face time with the genetic counselor was 25 minutes.  In summary:  Discussed carrier screening results and options for follow-up testing  Silent carrier for alpha-thalassemia  Recommend partner carrier screening. Ms. Koone indicated that partner carrier screening likely will not be possible  Reviewed low-risk NIPS results  Reduction in risk for Down syndrome,trisomy 18,trisomy 13, sex chromosome aneuploidies, and 22q11 deletion syndrome  Reviewed results of ultrasound  No fetal anomalies or markers seen  Reduction in risk for fetal aneuploidy  Offered additional testing and screening  Declined amniocentesis  Reviewed family history concerns  Recommend fetal echocardiogram   Gershon Crane, MS Genetic Counselor

## 2019-06-02 ENCOUNTER — Encounter: Payer: Medicaid Other | Admitting: Obstetrics & Gynecology

## 2019-06-02 ENCOUNTER — Other Ambulatory Visit: Payer: Medicaid Other

## 2019-06-19 ENCOUNTER — Ambulatory Visit (INDEPENDENT_AMBULATORY_CARE_PROVIDER_SITE_OTHER): Payer: Medicaid Other | Admitting: Family Medicine

## 2019-06-19 ENCOUNTER — Other Ambulatory Visit: Payer: Medicaid Other

## 2019-06-19 ENCOUNTER — Other Ambulatory Visit: Payer: Self-pay

## 2019-06-19 VITALS — BP 130/81 | HR 91 | Wt 151.0 lb

## 2019-06-19 DIAGNOSIS — O10913 Unspecified pre-existing hypertension complicating pregnancy, third trimester: Secondary | ICD-10-CM

## 2019-06-19 DIAGNOSIS — Z23 Encounter for immunization: Secondary | ICD-10-CM | POA: Diagnosis not present

## 2019-06-19 DIAGNOSIS — Z8632 Personal history of gestational diabetes: Secondary | ICD-10-CM

## 2019-06-19 DIAGNOSIS — O0993 Supervision of high risk pregnancy, unspecified, third trimester: Secondary | ICD-10-CM

## 2019-06-19 DIAGNOSIS — O09293 Supervision of pregnancy with other poor reproductive or obstetric history, third trimester: Secondary | ICD-10-CM

## 2019-06-19 DIAGNOSIS — O10912 Unspecified pre-existing hypertension complicating pregnancy, second trimester: Secondary | ICD-10-CM

## 2019-06-19 DIAGNOSIS — O0990 Supervision of high risk pregnancy, unspecified, unspecified trimester: Secondary | ICD-10-CM

## 2019-06-19 DIAGNOSIS — Z3A30 30 weeks gestation of pregnancy: Secondary | ICD-10-CM

## 2019-06-19 DIAGNOSIS — O09299 Supervision of pregnancy with other poor reproductive or obstetric history, unspecified trimester: Secondary | ICD-10-CM

## 2019-06-19 NOTE — Progress Notes (Signed)
   PRENATAL VISIT NOTE  Subjective:  Marie Cabrera is a 23 y.o. G2P1001 at [redacted]w[redacted]d being seen today for ongoing prenatal care.  She is currently monitored for the following issues for this high-risk pregnancy and has Oppositional defiant disorder; Alcohol-induced mood disorder ([place]); Substance abuse ([place]); Mild tetrahydrocannabinol (THC) abuse; Hx of preeclampsia, prior pregnancy, currently pregnant; History of gestational diabetes; Supervision of high risk pregnancy, antepartum; Asthma; Alpha+ thalassemia; Chronic hypertension complicating or reason for care during pregnancy, second trimester; and Fall on their problem list.  Patient reports no complaints.  Contractions: Not present. Vag. Bleeding: None.  Movement: Present. Denies leaking of fluid.   The following portions of the patient's history were reviewed and updated as appropriate: allergies, current medications, past family history, past medical history, past social history, past surgical history and problem list.   Objective:   Vitals:   06/19/19 0925  BP: 130/81  Pulse: 91  Weight: 151 lb (68.5 kg)    Fetal Status: Fetal Heart Rate (bpm): 141 Fundal Height: 31 cm Movement: Present     General:  Alert, oriented and cooperative. Patient is in no acute distress.  Skin: Skin is warm and dry. No rash noted.   Cardiovascular: Normal heart rate noted  Respiratory: Normal respiratory effort, no problems with respiration noted  Abdomen: Soft, gravid, appropriate for gestational age.  Pain/Pressure: Absent     Pelvic: Cervical exam deferred        Extremities: Normal range of motion.  Edema: Trace  Mental Status: Normal mood and affect. Normal behavior. Normal judgment and thought content.   Assessment and Plan:  Pregnancy: G2P1001 at [redacted]w[redacted]d 1. Supervision of high risk pregnancy, antepartum - Glucose Tolerance, 2 Hours w/1 Hour - CBC - HIV antibody (with reflex) - RPR - BMP added on as patient taking 74mEq of potassium daily  after recent hospitalization where K was 2.9; patient at risk for hyperK, will recheck and further advise - RTC in 2 weeks; virtual okay - patient owns BP cuff - referral for fetal echo; patient reports hx of "hole in heart" - likely VSD  2. Chronic hypertension complicating or reason for care during pregnancy, second trimester - Labetalol discontinued after low BP's during recent admission; BP normotensive today - continue home monitoring; patient aware of parameters  3. Hx of preeclampsia, prior pregnancy, currently pregnant - stopped taking baby ASA > 1 month prior; unclear reason but unlikely to have much benefit in restarting at this GA  4. History of gestational diabetes - GTT today; A1c 5.4 on intake   Preterm labor symptoms and general obstetric precautions including but not limited to vaginal bleeding, contractions, leaking of fluid and fetal movement were reviewed in detail with the patient. Please refer to After Visit Summary for other counseling recommendations.   Return in about 2 weeks (around 07/03/2019) for [facility]; virtual okay .  Future Appointments  Date Time Provider [place]  06/29/2019  1:45 PM [place] MFC-US  06/29/2019  1:45 PM [place] 2 WH-MFCUS MFC-US    Chauncey Mann, MD

## 2019-06-19 NOTE — Progress Notes (Signed)
ROB/GTT.  FLU vaccine given in LD, tolerated well.

## 2019-06-20 LAB — GLUCOSE TOLERANCE, 2 HOURS W/ 1HR
Glucose, 1 hour: 140 mg/dL (ref 65–179)
Glucose, 2 hour: 162 mg/dL — ABNORMAL HIGH (ref 65–152)
Glucose, Fasting: 76 mg/dL (ref 65–91)

## 2019-06-20 LAB — HIV ANTIBODY (ROUTINE TESTING W REFLEX): HIV Screen 4th Generation wRfx: NONREACTIVE

## 2019-06-20 LAB — CBC
Hematocrit: 30 % — ABNORMAL LOW (ref 34.0–46.6)
Hemoglobin: 10.1 g/dL — ABNORMAL LOW (ref 11.1–15.9)
MCH: 27.6 pg (ref 26.6–33.0)
MCHC: 33.7 g/dL (ref 31.5–35.7)
MCV: 82 fL (ref 79–97)
Platelets: 187 10*3/uL (ref 150–450)
RBC: 3.66 x10E6/uL — ABNORMAL LOW (ref 3.77–5.28)
RDW: 13.7 % (ref 11.7–15.4)
WBC: 9.6 10*3/uL (ref 3.4–10.8)

## 2019-06-20 LAB — BASIC METABOLIC PANEL
BUN/Creatinine Ratio: 11 (ref 9–23)
BUN: 5 mg/dL — ABNORMAL LOW (ref 6–20)
CO2: 20 mmol/L (ref 20–29)
Calcium: 8.9 mg/dL (ref 8.7–10.2)
Chloride: 105 mmol/L (ref 96–106)
Creatinine, Ser: 0.45 mg/dL — ABNORMAL LOW (ref 0.57–1.00)
GFR calc Af Amer: 162 mL/min/{1.73_m2} (ref >59)
GFR calc non Af Amer: 141 mL/min/{1.73_m2} (ref >59)
Glucose: 78 mg/dL (ref 65–99)
Potassium: 3.5 mmol/L (ref 3.5–5.2)
Sodium: 138 mmol/L (ref 134–144)

## 2019-06-20 LAB — RPR: RPR Ser Ql: NONREACTIVE

## 2019-06-22 ENCOUNTER — Other Ambulatory Visit: Payer: Self-pay | Admitting: Obstetrics and Gynecology

## 2019-06-22 DIAGNOSIS — R011 Cardiac murmur, unspecified: Secondary | ICD-10-CM

## 2019-06-22 NOTE — Progress Notes (Signed)
-   Fetal echo ordered

## 2019-06-23 ENCOUNTER — Telehealth: Payer: Self-pay

## 2019-06-23 DIAGNOSIS — R7309 Other abnormal glucose: Secondary | ICD-10-CM

## 2019-06-23 MED ORDER — ACCU-CHEK GUIDE W/DEVICE KIT: 1.0000 | PACK | Freq: Four times a day (QID) | 0 refills | Status: DC

## 2019-06-23 MED ORDER — GLUCOSE BLOOD VI STRP: ORAL_STRIP | 12 refills | Status: DC

## 2019-06-23 MED ORDER — ACCU-CHEK FASTCLIX LANCETS MISC: 1.0000 | Freq: Four times a day (QID) | 12 refills | Status: DC

## 2019-06-23 NOTE — Telephone Encounter (Signed)
Patient called and made aware that she failed two hour gtt. Patient made aware she should start check her glucose fasting and 2 hours after every meal. Patient states understanding.  Patient also made aware we will set her up with diabetes education. Kathrene Alu RN

## 2019-06-23 NOTE — Telephone Encounter (Signed)
-----   Message from Marie Mann, MD sent at 06/20/2019 12:48 PM EDT ----- Positive GTT. Can we set this patient up with diabetic educator and get her testing supplies ASAP?  Thanks so much! [address]

## 2019-06-29 ENCOUNTER — Encounter (HOSPITAL_COMMUNITY): Payer: Self-pay | Admitting: *Deleted

## 2019-06-29 ENCOUNTER — Ambulatory Visit (HOSPITAL_COMMUNITY)
Admission: RE | Admit: 2019-06-29 | Discharge: 2019-06-29 | Disposition: A | Discharge status: Home / Self Care | Payer: Medicaid Other | Source: Ambulatory Visit | Attending: Obstetrics and Gynecology | Admitting: Obstetrics and Gynecology

## 2019-06-29 ENCOUNTER — Other Ambulatory Visit (HOSPITAL_COMMUNITY): Payer: Self-pay | Admitting: *Deleted

## 2019-06-29 ENCOUNTER — Other Ambulatory Visit: Payer: Self-pay

## 2019-06-29 ENCOUNTER — Other Ambulatory Visit (HOSPITAL_COMMUNITY): Payer: Self-pay | Admitting: Obstetrics

## 2019-06-29 ENCOUNTER — Ambulatory Visit (HOSPITAL_COMMUNITY): Payer: Medicaid Other | Admitting: *Deleted

## 2019-06-29 VITALS — BP 137/75 | HR 101 | Temp 98.7°F

## 2019-06-29 DIAGNOSIS — O10013 Pre-existing essential hypertension complicating pregnancy, third trimester: Secondary | ICD-10-CM | POA: Diagnosis not present

## 2019-06-29 DIAGNOSIS — O10919 Unspecified pre-existing hypertension complicating pregnancy, unspecified trimester: Secondary | ICD-10-CM

## 2019-06-29 DIAGNOSIS — O09293 Supervision of pregnancy with other poor reproductive or obstetric history, third trimester: Secondary | ICD-10-CM

## 2019-06-29 DIAGNOSIS — Z362 Encounter for other antenatal screening follow-up: Secondary | ICD-10-CM

## 2019-06-29 DIAGNOSIS — Z3A32 32 weeks gestation of pregnancy: Secondary | ICD-10-CM | POA: Diagnosis not present

## 2019-07-02 ENCOUNTER — Telehealth (INDEPENDENT_AMBULATORY_CARE_PROVIDER_SITE_OTHER): Payer: Medicaid Other | Admitting: Obstetrics and Gynecology

## 2019-07-02 ENCOUNTER — Encounter: Payer: Self-pay | Admitting: Obstetrics and Gynecology

## 2019-07-02 ENCOUNTER — Other Ambulatory Visit: Payer: Medicaid Other

## 2019-07-02 DIAGNOSIS — O0990 Supervision of high risk pregnancy, unspecified, unspecified trimester: Secondary | ICD-10-CM

## 2019-07-02 DIAGNOSIS — O10913 Unspecified pre-existing hypertension complicating pregnancy, third trimester: Secondary | ICD-10-CM

## 2019-07-02 DIAGNOSIS — O09293 Supervision of pregnancy with other poor reproductive or obstetric history, third trimester: Secondary | ICD-10-CM

## 2019-07-02 DIAGNOSIS — O09299 Supervision of pregnancy with other poor reproductive or obstetric history, unspecified trimester: Secondary | ICD-10-CM

## 2019-07-02 DIAGNOSIS — Z3A32 32 weeks gestation of pregnancy: Secondary | ICD-10-CM

## 2019-07-02 DIAGNOSIS — O24419 Gestational diabetes mellitus in pregnancy, unspecified control: Secondary | ICD-10-CM | POA: Insufficient documentation

## 2019-07-02 DIAGNOSIS — O24410 Gestational diabetes mellitus in pregnancy, diet controlled: Secondary | ICD-10-CM

## 2019-07-02 DIAGNOSIS — O10912 Unspecified pre-existing hypertension complicating pregnancy, second trimester: Secondary | ICD-10-CM

## 2019-07-02 DIAGNOSIS — O0993 Supervision of high risk pregnancy, unspecified, third trimester: Secondary | ICD-10-CM

## 2019-07-02 NOTE — Progress Notes (Signed)
TELEHEALTH OBSTETRICS PRENATAL VIRTUAL VIDEO VISIT ENCOUNTER NOTE  Provider location: [facility] for [facility] at [place]   I connected with Marie Cabrera on 07/02/19 at  3:00 PM EDT by MyChart Video Encounter at home and verified that I am speaking with the correct person using two identifiers.   I discussed the limitations, risks, security and privacy concerns of performing an evaluation and management service virtually and the availability of in person appointments. I also discussed with the patient that there may be a patient responsible charge related to this service. The patient expressed understanding and agreed to proceed. Subjective:  Marie Cabrera is a 23 y.o. G2P1001 at [redacted]w[redacted]d being seen today for ongoing prenatal care.  She is currently monitored for the following issues for this high-risk pregnancy and has Oppositional defiant disorder; Alcohol-induced mood disorder ([place]); Substance abuse ([place]); Mild tetrahydrocannabinol (THC) abuse; Hx of preeclampsia, prior pregnancy, currently pregnant; History of gestational diabetes; Supervision of high risk pregnancy, antepartum; Asthma; Alpha+ thalassemia; Chronic hypertension complicating or reason for care during pregnancy, second trimester; Fall; and Gestational diabetes on their problem list.  Patient reports occasional contractions.  Contractions: Not present. Vag. Bleeding: None.  Movement: Present. Denies any leaking of fluid.   The following portions of the patient's history were reviewed and updated as appropriate: allergies, current medications, past family history, past medical history, past social history, past surgical history and problem list.   Objective:  There were no vitals filed for this visit.  Fetal Status:     Movement: Present     General:  Alert, oriented and cooperative. Patient is in no acute distress.  Respiratory: Normal respiratory effort, no problems with respiration noted  Mental Status: Normal  mood and affect. Normal behavior. Normal judgment and thought content.  Rest of physical exam deferred due to type of encounter   Assessment and Plan:  Pregnancy: G2P1001 at [redacted]w[redacted]d  1. Supervision of high risk pregnancy, antepartum  2. H/o heart murmur - Has not had fetal echo, patient reports she had made appointment but did not get a text about when the appointment was  2. Hx of preeclampsia, prior pregnancy, currently pregnant  3. Chronic hypertension complicating or reason for care during pregnancy, second trimester - Couldn't find BP cuff today - no meds - Stopped taking baby ASA recently, did not restart - Labetalol stopped after recent admission - had recent growth and BPP, wnl - cont weekly testing  4. Diet controlled gestational diabetes mellitus (GDM) in third trimester Patient unaware that supplies were at pharmacy, has not been checking CBGs - reports she has not had diabetic education  Preterm labor symptoms and general obstetric precautions including but not limited to vaginal bleeding, contractions, leaking of fluid and fetal movement were reviewed in detail with the patient. I discussed the assessment and treatment plan with the patient. The patient was provided an opportunity to ask questions and all were answered. The patient agreed with the plan and demonstrated an understanding of the instructions. The patient was advised to call back or seek an in-person office evaluation/go to [facility] at [facility] for any urgent or concerning symptoms. Please refer to After Visit Summary for other counseling recommendations.   I provided 15 minutes of face-to-face time during this encounter.  Return in about 2 weeks (around 07/16/2019) for high OB, in person.  Future Appointments  Date Time Provider [place]  07/06/2019  1:30 PM WH-MFC [place] 1 WH-MFCUS MFC-US  07/06/2019  1:35  PM WH-MFC NURSE WH-MFC MFC-US  07/13/2019  2:45 PM WH-MFC [place] 2 WH-MFCUS MFC-US   07/13/2019  2:50 PM WH-MFC NURSE WH-MFC MFC-US    Conan Bowens, MD [facility] for [facility], [facility]

## 2019-07-02 NOTE — Progress Notes (Signed)
S/w patient for mychart visit. Pt was unaware that glucose supplies were sent to pharmacy, and pt cannot find her BP cuff today.

## 2019-07-06 ENCOUNTER — Ambulatory Visit (HOSPITAL_COMMUNITY): Payer: Medicaid Other

## 2019-07-07 ENCOUNTER — Encounter (HOSPITAL_COMMUNITY): Payer: Self-pay

## 2019-07-07 ENCOUNTER — Ambulatory Visit (HOSPITAL_COMMUNITY): Payer: Medicaid Other | Attending: Obstetrics and Gynecology

## 2019-07-07 ENCOUNTER — Ambulatory Visit (HOSPITAL_COMMUNITY): Admission: RE | Admit: 2019-07-07 | Payer: Medicaid Other | Source: Ambulatory Visit

## 2019-07-09 ENCOUNTER — Other Ambulatory Visit: Payer: Medicaid Other

## 2019-07-09 ENCOUNTER — Encounter: Payer: Self-pay | Admitting: Obstetrics & Gynecology

## 2019-07-13 ENCOUNTER — Other Ambulatory Visit: Payer: Self-pay

## 2019-07-13 ENCOUNTER — Other Ambulatory Visit (HOSPITAL_COMMUNITY): Payer: Self-pay | Admitting: Obstetrics

## 2019-07-13 ENCOUNTER — Ambulatory Visit (HOSPITAL_COMMUNITY)
Admission: RE | Admit: 2019-07-13 | Discharge: 2019-07-13 | Disposition: A | Discharge status: Home / Self Care | Payer: Medicaid Other | Source: Ambulatory Visit | Attending: Obstetrics and Gynecology | Admitting: Obstetrics and Gynecology

## 2019-07-13 ENCOUNTER — Ambulatory Visit (HOSPITAL_COMMUNITY): Payer: Medicaid Other | Admitting: *Deleted

## 2019-07-13 ENCOUNTER — Encounter (HOSPITAL_COMMUNITY): Payer: Self-pay | Admitting: *Deleted

## 2019-07-13 VITALS — BP 134/72

## 2019-07-13 VITALS — BP 155/75 | HR 86 | Temp 98.7°F

## 2019-07-13 DIAGNOSIS — O289 Unspecified abnormal findings on antenatal screening of mother: Secondary | ICD-10-CM | POA: Diagnosis not present

## 2019-07-13 DIAGNOSIS — O10919 Unspecified pre-existing hypertension complicating pregnancy, unspecified trimester: Secondary | ICD-10-CM

## 2019-07-13 DIAGNOSIS — Z3A34 34 weeks gestation of pregnancy: Secondary | ICD-10-CM | POA: Diagnosis not present

## 2019-07-13 DIAGNOSIS — I10 Essential (primary) hypertension: Secondary | ICD-10-CM | POA: Insufficient documentation

## 2019-07-13 DIAGNOSIS — O24410 Gestational diabetes mellitus in pregnancy, diet controlled: Secondary | ICD-10-CM | POA: Insufficient documentation

## 2019-07-13 DIAGNOSIS — O09293 Supervision of pregnancy with other poor reproductive or obstetric history, third trimester: Secondary | ICD-10-CM | POA: Diagnosis not present

## 2019-07-13 DIAGNOSIS — O10013 Pre-existing essential hypertension complicating pregnancy, third trimester: Secondary | ICD-10-CM | POA: Diagnosis not present

## 2019-07-13 NOTE — Procedures (Signed)
Marie Cabrera Jun 11, 1996 [redacted]w[redacted]d  Fetus A Non-Stress Test Interpretation for 07/13/19  Indication: Unsatisfactory BPP, GDM-diet  Fetal Heart Rate A Mode: External Baseline Rate (A): 125 bpm Variability: Moderate Accelerations: 15 x 15 Decelerations: None Multiple birth?: No  Uterine Activity Mode: Palpation, Toco Contraction Frequency (min): irreg Contraction Duration (sec): 30-60 Contraction Quality: Mild Resting Tone Palpated: Relaxed Resting Time: Adequate  Interpretation (Fetal Testing) Nonstress Test Interpretation: Reactive Comments: EFM tracing reviewed by Dr. Donalee Citrin

## 2019-07-14 ENCOUNTER — Other Ambulatory Visit (HOSPITAL_COMMUNITY): Payer: Self-pay | Admitting: *Deleted

## 2019-07-14 DIAGNOSIS — O10913 Unspecified pre-existing hypertension complicating pregnancy, third trimester: Secondary | ICD-10-CM

## 2019-07-16 ENCOUNTER — Encounter: Payer: Medicaid Other | Admitting: Obstetrics and Gynecology

## 2019-07-20 ENCOUNTER — Encounter: Payer: Medicaid Other | Admitting: Obstetrics and Gynecology

## 2019-07-21 ENCOUNTER — Other Ambulatory Visit: Payer: Self-pay

## 2019-07-21 ENCOUNTER — Encounter (HOSPITAL_COMMUNITY): Payer: Self-pay | Admitting: *Deleted

## 2019-07-21 ENCOUNTER — Ambulatory Visit (HOSPITAL_COMMUNITY): Payer: Medicaid Other | Admitting: *Deleted

## 2019-07-21 ENCOUNTER — Ambulatory Visit (HOSPITAL_COMMUNITY)
Admission: RE | Admit: 2019-07-21 | Discharge: 2019-07-21 | Disposition: A | Discharge status: Home / Self Care | Payer: Medicaid Other | Source: Ambulatory Visit | Attending: Obstetrics and Gynecology | Admitting: Obstetrics and Gynecology

## 2019-07-21 VITALS — BP 142/88 | HR 102 | Temp 98.7°F

## 2019-07-21 DIAGNOSIS — I10 Essential (primary) hypertension: Secondary | ICD-10-CM | POA: Diagnosis present

## 2019-07-21 DIAGNOSIS — O10919 Unspecified pre-existing hypertension complicating pregnancy, unspecified trimester: Secondary | ICD-10-CM | POA: Insufficient documentation

## 2019-07-21 DIAGNOSIS — Z148 Genetic carrier of other disease: Secondary | ICD-10-CM | POA: Diagnosis not present

## 2019-07-21 DIAGNOSIS — Z3A35 35 weeks gestation of pregnancy: Secondary | ICD-10-CM | POA: Diagnosis not present

## 2019-07-21 DIAGNOSIS — O09293 Supervision of pregnancy with other poor reproductive or obstetric history, third trimester: Secondary | ICD-10-CM

## 2019-07-21 DIAGNOSIS — O10013 Pre-existing essential hypertension complicating pregnancy, third trimester: Secondary | ICD-10-CM

## 2019-07-23 ENCOUNTER — Encounter: Payer: Self-pay | Admitting: Obstetrics and Gynecology

## 2019-07-23 ENCOUNTER — Ambulatory Visit (INDEPENDENT_AMBULATORY_CARE_PROVIDER_SITE_OTHER): Payer: Medicaid Other | Admitting: Obstetrics and Gynecology

## 2019-07-23 ENCOUNTER — Other Ambulatory Visit: Payer: Self-pay

## 2019-07-23 VITALS — BP 126/71 | HR 106 | Wt 155.0 lb

## 2019-07-23 DIAGNOSIS — O10913 Unspecified pre-existing hypertension complicating pregnancy, third trimester: Secondary | ICD-10-CM

## 2019-07-23 DIAGNOSIS — O0990 Supervision of high risk pregnancy, unspecified, unspecified trimester: Secondary | ICD-10-CM

## 2019-07-23 DIAGNOSIS — O0993 Supervision of high risk pregnancy, unspecified, third trimester: Secondary | ICD-10-CM

## 2019-07-23 DIAGNOSIS — O09299 Supervision of pregnancy with other poor reproductive or obstetric history, unspecified trimester: Secondary | ICD-10-CM

## 2019-07-23 DIAGNOSIS — O10912 Unspecified pre-existing hypertension complicating pregnancy, second trimester: Secondary | ICD-10-CM

## 2019-07-23 DIAGNOSIS — O09293 Supervision of pregnancy with other poor reproductive or obstetric history, third trimester: Secondary | ICD-10-CM

## 2019-07-23 DIAGNOSIS — O24410 Gestational diabetes mellitus in pregnancy, diet controlled: Secondary | ICD-10-CM

## 2019-07-23 DIAGNOSIS — Z3A35 35 weeks gestation of pregnancy: Secondary | ICD-10-CM

## 2019-07-23 NOTE — Progress Notes (Signed)
   PRENATAL VISIT NOTE  Subjective:  Marie Cabrera is a 23 y.o. G2P1001 at [redacted]w[redacted]d being seen today for ongoing prenatal care.  She is currently monitored for the following issues for this high-risk pregnancy and has Oppositional defiant disorder; Alcohol-induced mood disorder ([place]); Substance abuse ([place]); Mild tetrahydrocannabinol (THC) abuse; Hx of preeclampsia, prior pregnancy, currently pregnant; History of gestational diabetes; Supervision of high risk pregnancy, antepartum; Asthma; Alpha+ thalassemia; Chronic hypertension complicating or reason for care during pregnancy, second trimester; Fall; and Gestational diabetes on their problem list.  Patient reports no complaints.  Contractions: Not present.  .  Movement: Present. Denies leaking of fluid.   The following portions of the patient's history were reviewed and updated as appropriate: allergies, current medications, past family history, past medical history, past social history, past surgical history and problem list.   Objective:   Vitals:   07/23/19 1545  BP: 126/71  Pulse: (!) 106  Weight: 155 lb (70.3 kg)    Fetal Status: Fetal Heart Rate (bpm): 145 Fundal Height: 36 cm Movement: Present     General:  Alert, oriented and cooperative. Patient is in no acute distress.  Skin: Skin is warm and dry. No rash noted.   Cardiovascular: Normal heart rate noted  Respiratory: Normal respiratory effort, no problems with respiration noted  Abdomen: Soft, gravid, appropriate for gestational age.  Pain/Pressure: Present     Pelvic: Cervical exam deferred        Extremities: Normal range of motion.  Edema: None  Mental Status: Normal mood and affect. Normal behavior. Normal judgment and thought content.   Assessment and Plan:  Pregnancy: G2P1001 at [redacted]w[redacted]d 1. Supervision of high risk pregnancy, antepartum Cultures next visit Patient is doing well with complaints of heartburn- Rx protonix provided Patient has not been able to have a  fetal echo- will attempt to reschedule it for the patient  2. Diet controlled gestational diabetes mellitus (GDM) in third trimester Patient is not checking CBGs consistently. She states that she is often forgetful . Unable to recall a fasting or pp value Patient asked to write values down and to bring at her next appointment  3. Chronic hypertension complicating or reason for care during pregnancy, second trimester Normotensive without medication. Patient is not taking ASA either Continue antenatal testing with MFM- next scheduled on 11/17  4. Hx of preeclampsia, prior pregnancy, currently pregnant   Preterm labor symptoms and general obstetric precautions including but not limited to vaginal bleeding, contractions, leaking of fluid and fetal movement were reviewed in detail with the patient. Please refer to After Visit Summary for other counseling recommendations.   Return in about 1 week (around 07/30/2019) for in person, ROB, High risk.  Future Appointments  Date Time Provider [place]  07/23/2019  4:00 PM [place] Guilbault, Vickii Chafe, MD [facility] None  07/28/2019  3:15 PM WH-MFC [place] 4 WH-MFCUS MFC-US  07/28/2019  3:20 PM [place] MFC-US    Mora Bellman, MD

## 2019-07-28 ENCOUNTER — Ambulatory Visit (HOSPITAL_COMMUNITY): Payer: Medicaid Other | Admitting: *Deleted

## 2019-07-28 ENCOUNTER — Encounter (HOSPITAL_COMMUNITY): Payer: Self-pay | Admitting: *Deleted

## 2019-07-28 ENCOUNTER — Ambulatory Visit (HOSPITAL_COMMUNITY)
Admission: RE | Admit: 2019-07-28 | Discharge: 2019-07-28 | Disposition: A | Discharge status: Home / Self Care | Payer: Medicaid Other | Source: Ambulatory Visit | Attending: Obstetrics and Gynecology | Admitting: Obstetrics and Gynecology

## 2019-07-28 ENCOUNTER — Other Ambulatory Visit: Payer: Self-pay

## 2019-07-28 VITALS — BP 146/79 | HR 95 | Temp 97.7°F

## 2019-07-28 DIAGNOSIS — I10 Essential (primary) hypertension: Secondary | ICD-10-CM | POA: Diagnosis present

## 2019-07-28 DIAGNOSIS — O10919 Unspecified pre-existing hypertension complicating pregnancy, unspecified trimester: Secondary | ICD-10-CM | POA: Diagnosis not present

## 2019-07-28 DIAGNOSIS — Z3A36 36 weeks gestation of pregnancy: Secondary | ICD-10-CM

## 2019-07-28 DIAGNOSIS — O10913 Unspecified pre-existing hypertension complicating pregnancy, third trimester: Secondary | ICD-10-CM | POA: Diagnosis not present

## 2019-07-28 DIAGNOSIS — Z362 Encounter for other antenatal screening follow-up: Secondary | ICD-10-CM

## 2019-07-28 DIAGNOSIS — O09293 Supervision of pregnancy with other poor reproductive or obstetric history, third trimester: Secondary | ICD-10-CM

## 2019-07-29 ENCOUNTER — Other Ambulatory Visit (HOSPITAL_COMMUNITY): Payer: Self-pay | Admitting: *Deleted

## 2019-07-29 ENCOUNTER — Other Ambulatory Visit (HOSPITAL_COMMUNITY)
Admission: RE | Admit: 2019-07-29 | Discharge: 2019-07-29 | Disposition: A | Discharge status: Home / Self Care | Payer: Medicaid Other | Source: Ambulatory Visit | Attending: Obstetrics & Gynecology | Admitting: Obstetrics & Gynecology

## 2019-07-29 ENCOUNTER — Ambulatory Visit (INDEPENDENT_AMBULATORY_CARE_PROVIDER_SITE_OTHER): Payer: Medicaid Other | Admitting: Obstetrics & Gynecology

## 2019-07-29 ENCOUNTER — Other Ambulatory Visit: Payer: Self-pay

## 2019-07-29 VITALS — BP 146/78 | HR 87 | Wt 158.0 lb

## 2019-07-29 DIAGNOSIS — O0990 Supervision of high risk pregnancy, unspecified, unspecified trimester: Secondary | ICD-10-CM | POA: Insufficient documentation

## 2019-07-29 DIAGNOSIS — O09293 Supervision of pregnancy with other poor reproductive or obstetric history, third trimester: Secondary | ICD-10-CM

## 2019-07-29 DIAGNOSIS — O24410 Gestational diabetes mellitus in pregnancy, diet controlled: Secondary | ICD-10-CM

## 2019-07-29 DIAGNOSIS — Z3A36 36 weeks gestation of pregnancy: Secondary | ICD-10-CM

## 2019-07-29 DIAGNOSIS — O10913 Unspecified pre-existing hypertension complicating pregnancy, third trimester: Secondary | ICD-10-CM

## 2019-07-29 DIAGNOSIS — O09299 Supervision of pregnancy with other poor reproductive or obstetric history, unspecified trimester: Secondary | ICD-10-CM

## 2019-07-29 DIAGNOSIS — O0993 Supervision of high risk pregnancy, unspecified, third trimester: Secondary | ICD-10-CM

## 2019-07-29 DIAGNOSIS — O10912 Unspecified pre-existing hypertension complicating pregnancy, second trimester: Secondary | ICD-10-CM

## 2019-07-29 NOTE — Progress Notes (Signed)
   PRENATAL VISIT NOTE  Subjective:  Marie Cabrera is a 23 y.o. G2P1001 at [redacted]w[redacted]d being seen today for ongoing prenatal care.  She is currently monitored for the following issues for this high-risk pregnancy and has Oppositional defiant disorder; Alcohol-induced mood disorder ([place]); Substance abuse ([place]); Mild tetrahydrocannabinol (THC) abuse; Hx of preeclampsia, prior pregnancy, currently pregnant; History of gestational diabetes; Supervision of high risk pregnancy, antepartum; Asthma; Alpha+ thalassemia; Chronic hypertension complicating or reason for care during pregnancy, second trimester; Fall; and Gestational diabetes on their problem list.  Patient reports no complaints.  Contractions: Not present. Vag. Bleeding: None.  Movement: Present. Denies leaking of fluid.   The following portions of the patient's history were reviewed and updated as appropriate: allergies, current medications, past family history, past medical history, past social history, past surgical history and problem list.   Objective:   Vitals:   07/29/19 1621  BP: (!) 146/78  Pulse: 87  Weight: 158 lb (71.7 kg)    Fetal Status:     Movement: Present     General:  Alert, oriented and cooperative. Patient is in no acute distress.  Skin: Skin is warm and dry. No rash noted.   Cardiovascular: Normal heart rate noted  Respiratory: Normal respiratory effort, no problems with respiration noted  Abdomen: Soft, gravid, appropriate for gestational age.  Pain/Pressure: Present     Pelvic: Cervical exam performed        Extremities: Normal range of motion.  Edema: None  Mental Status: Normal mood and affect. Normal behavior. Normal judgment and thought content.   Assessment and Plan:  Pregnancy: G2P1001 at [redacted]w[redacted]d 1. Chronic hypertension complicating or reason for care during pregnancy, second trimester F/u BPP 8/8 yesterday - [place] MFM FETAL BPP WO NON STRESS; Future  2. Hx of preeclampsia, prior pregnancy, currently  pregnant CHTN no severe range  3. Supervision of high risk pregnancy, antepartum    4. Diet controlled gestational diabetes mellitus (GDM) in third trimester Did not bring her values, FBS <90 pp may be up to 150, please bring meter or records  Preterm labor symptoms and general obstetric precautions including but not limited to vaginal bleeding, contractions, leaking of fluid and fetal movement were reviewed in detail with the patient. Please refer to After Visit Summary for other counseling recommendations.   Return in about 1 week (around 08/05/2019).  Future Appointments  Date Time Provider [place]  08/03/2019  3:30 PM [place] NURSE [place] MFC-US  08/03/2019  3:30 PM [place] 1 WH-MFCUS MFC-US  08/10/2019  3:30 PM [place] NURSE [place] MFC-US  08/10/2019  3:30 PM [place] 3 WH-MFCUS MFC-US  08/17/2019  3:30 PM [place] NURSE [place] MFC-US  08/17/2019  3:30 PM [place] 1 WH-MFCUS MFC-US    Emeterio Reeve, MD

## 2019-07-29 NOTE — Patient Instructions (Signed)

## 2019-07-31 LAB — STREP GP B NAA: Strep Gp B NAA: NEGATIVE

## 2019-07-31 LAB — CERVICOVAGINAL ANCILLARY ONLY
Chlamydia: NEGATIVE
Comment: NEGATIVE
Comment: NORMAL
Neisseria Gonorrhea: NEGATIVE

## 2019-07-31 NOTE — Progress Notes (Deleted)
f °

## 2019-08-03 ENCOUNTER — Encounter (HOSPITAL_COMMUNITY): Payer: Self-pay

## 2019-08-03 ENCOUNTER — Ambulatory Visit (HOSPITAL_COMMUNITY): Payer: Medicaid Other

## 2019-08-03 ENCOUNTER — Ambulatory Visit (HOSPITAL_COMMUNITY): Payer: Medicaid Other | Attending: Obstetrics and Gynecology

## 2019-08-04 ENCOUNTER — Ambulatory Visit (INDEPENDENT_AMBULATORY_CARE_PROVIDER_SITE_OTHER): Payer: Medicaid Other | Admitting: Obstetrics and Gynecology

## 2019-08-04 ENCOUNTER — Encounter: Payer: Self-pay | Admitting: Obstetrics and Gynecology

## 2019-08-04 ENCOUNTER — Other Ambulatory Visit: Payer: Self-pay

## 2019-08-04 VITALS — BP 125/73 | HR 108 | Wt 156.8 lb

## 2019-08-04 DIAGNOSIS — O0993 Supervision of high risk pregnancy, unspecified, third trimester: Secondary | ICD-10-CM | POA: Diagnosis not present

## 2019-08-04 DIAGNOSIS — Z3A37 37 weeks gestation of pregnancy: Secondary | ICD-10-CM

## 2019-08-04 DIAGNOSIS — O10913 Unspecified pre-existing hypertension complicating pregnancy, third trimester: Secondary | ICD-10-CM

## 2019-08-04 DIAGNOSIS — O10912 Unspecified pre-existing hypertension complicating pregnancy, second trimester: Secondary | ICD-10-CM

## 2019-08-04 DIAGNOSIS — O24410 Gestational diabetes mellitus in pregnancy, diet controlled: Secondary | ICD-10-CM | POA: Diagnosis not present

## 2019-08-04 DIAGNOSIS — O0990 Supervision of high risk pregnancy, unspecified, unspecified trimester: Secondary | ICD-10-CM

## 2019-08-04 NOTE — Progress Notes (Signed)
Pt is here for ROB/NST. 105w2d.

## 2019-08-04 NOTE — Progress Notes (Signed)
   PRENATAL VISIT NOTE  Subjective:  Marie Cabrera is a 23 y.o. G2P1001 at [redacted]w[redacted]d being seen today for ongoing prenatal care.  She is currently monitored for the following issues for this high-risk pregnancy and has Oppositional defiant disorder; Alcohol-induced mood disorder ([place]); Substance abuse ([place]); Mild tetrahydrocannabinol (THC) abuse; Hx of preeclampsia, prior pregnancy, currently pregnant; History of gestational diabetes; Supervision of high risk pregnancy, antepartum; Asthma; Alpha+ thalassemia; Chronic hypertension complicating or reason for care during pregnancy, second trimester; Fall; and Gestational diabetes on their problem list.  Patient reports no complaints.  Contractions: Irregular. Vag. Bleeding: None.  Movement: Present. Denies leaking of fluid.   The following portions of the patient's history were reviewed and updated as appropriate: allergies, current medications, past family history, past medical history, past social history, past surgical history and problem list.   Objective:   Vitals:   08/04/19 1526  BP: 125/73  Pulse: (!) 108  Weight: 156 lb 12.8 oz (71.1 kg)    Fetal Status: Fetal Heart Rate (bpm): NST Fundal Height: 36 cm Movement: Present  Presentation: Vertex  General:  Alert, oriented and cooperative. Patient is in no acute distress.  Skin: Skin is warm and dry. No rash noted.   Cardiovascular: Normal heart rate noted  Respiratory: Normal respiratory effort, no problems with respiration noted  Abdomen: Soft, gravid, appropriate for gestational age.  Pain/Pressure: Present     Pelvic: Cervical exam performed Dilation: 2 Effacement (%): 50 Station: -3  Extremities: Normal range of motion.  Edema: None  Mental Status: Normal mood and affect. Normal behavior. Normal judgment and thought content.   Assessment and Plan:  Pregnancy: G2P1001 at [redacted]w[redacted]d 1. Supervision of high risk pregnancy, antepartum Patient is doing well without complaints  2.  Chronic hypertension complicating or reason for care during pregnancy, second trimester Normotensive and asymptomatic Patient not currently on any medication and never took daily ASA  3. Diet controlled gestational diabetes mellitus (GDM) in third trimester CBGs reviewed with few numbers. Majority within range  Discussed importance of checking CBGs 4 times daily particularly in this peripartum phase Follow up ultrasound next week NST reviewed and reactive with baseline 140, mod variability, +accels, no decels  Term labor symptoms and general obstetric precautions including but not limited to vaginal bleeding, contractions, leaking of fluid and fetal movement were reviewed in detail with the patient. Please refer to After Visit Summary for other counseling recommendations.   No follow-ups on file.  Future Appointments  Date Time Provider [place]  08/04/2019  3:45 PM Rollie Hy[place], Vickii Chafe, MD [place] None  08/10/2019  3:30 PM [place] MFC-US  08/10/2019  3:30 PM [place] 3 WH-MFCUS MFC-US  08/17/2019  3:30 PM [place] MFC-US  08/17/2019  3:30 PM [place] 1 WH-MFCUS MFC-US    Mora Bellman, MD

## 2019-08-10 ENCOUNTER — Ambulatory Visit (HOSPITAL_COMMUNITY): Payer: Medicaid Other

## 2019-08-11 ENCOUNTER — Inpatient Hospital Stay (HOSPITAL_COMMUNITY)
Admission: AD | Admit: 2019-08-11 | Discharge: 2019-08-13 | DRG: 807 | Disposition: A | Discharge status: Home / Self Care | Payer: Medicaid Other | Attending: Obstetrics and Gynecology | Admitting: Obstetrics and Gynecology

## 2019-08-11 ENCOUNTER — Other Ambulatory Visit: Payer: Self-pay

## 2019-08-11 ENCOUNTER — Ambulatory Visit (INDEPENDENT_AMBULATORY_CARE_PROVIDER_SITE_OTHER): Payer: Medicaid Other | Admitting: Obstetrics & Gynecology

## 2019-08-11 ENCOUNTER — Encounter (HOSPITAL_COMMUNITY): Payer: Self-pay

## 2019-08-11 VITALS — BP 148/90 | HR 94 | Wt 156.2 lb

## 2019-08-11 DIAGNOSIS — O0990 Supervision of high risk pregnancy, unspecified, unspecified trimester: Secondary | ICD-10-CM

## 2019-08-11 DIAGNOSIS — O24420 Gestational diabetes mellitus in childbirth, diet controlled: Secondary | ICD-10-CM | POA: Diagnosis present

## 2019-08-11 DIAGNOSIS — O26893 Other specified pregnancy related conditions, third trimester: Secondary | ICD-10-CM | POA: Diagnosis present

## 2019-08-11 DIAGNOSIS — Z3046 Encounter for surveillance of implantable subdermal contraceptive: Secondary | ICD-10-CM | POA: Diagnosis not present

## 2019-08-11 DIAGNOSIS — O09299 Supervision of pregnancy with other poor reproductive or obstetric history, unspecified trimester: Secondary | ICD-10-CM

## 2019-08-11 DIAGNOSIS — O1002 Pre-existing essential hypertension complicating childbirth: Secondary | ICD-10-CM | POA: Diagnosis present

## 2019-08-11 DIAGNOSIS — D563 Thalassemia minor: Secondary | ICD-10-CM | POA: Diagnosis present

## 2019-08-11 DIAGNOSIS — O10913 Unspecified pre-existing hypertension complicating pregnancy, third trimester: Secondary | ICD-10-CM

## 2019-08-11 DIAGNOSIS — Z3A38 38 weeks gestation of pregnancy: Secondary | ICD-10-CM | POA: Diagnosis not present

## 2019-08-11 DIAGNOSIS — O10912 Unspecified pre-existing hypertension complicating pregnancy, second trimester: Secondary | ICD-10-CM

## 2019-08-11 DIAGNOSIS — J45909 Unspecified asthma, uncomplicated: Secondary | ICD-10-CM | POA: Diagnosis present

## 2019-08-11 DIAGNOSIS — D560 Alpha thalassemia: Secondary | ICD-10-CM | POA: Diagnosis present

## 2019-08-11 DIAGNOSIS — O09293 Supervision of pregnancy with other poor reproductive or obstetric history, third trimester: Secondary | ICD-10-CM

## 2019-08-11 DIAGNOSIS — O24410 Gestational diabetes mellitus in pregnancy, diet controlled: Secondary | ICD-10-CM

## 2019-08-11 DIAGNOSIS — Z30017 Encounter for initial prescription of implantable subdermal contraceptive: Secondary | ICD-10-CM

## 2019-08-11 DIAGNOSIS — Z3490 Encounter for supervision of normal pregnancy, unspecified, unspecified trimester: Secondary | ICD-10-CM | POA: Diagnosis present

## 2019-08-11 DIAGNOSIS — O24419 Gestational diabetes mellitus in pregnancy, unspecified control: Secondary | ICD-10-CM | POA: Diagnosis present

## 2019-08-11 DIAGNOSIS — Z20828 Contact with and (suspected) exposure to other viral communicable diseases: Secondary | ICD-10-CM | POA: Diagnosis present

## 2019-08-11 DIAGNOSIS — O9952 Diseases of the respiratory system complicating childbirth: Secondary | ICD-10-CM | POA: Diagnosis present

## 2019-08-11 DIAGNOSIS — R011 Cardiac murmur, unspecified: Secondary | ICD-10-CM | POA: Diagnosis present

## 2019-08-11 DIAGNOSIS — O0993 Supervision of high risk pregnancy, unspecified, third trimester: Secondary | ICD-10-CM

## 2019-08-11 LAB — GLUCOSE, CAPILLARY
Glucose-Capillary: 53 mg/dL — ABNORMAL LOW (ref 70–99)
Glucose-Capillary: 74 mg/dL (ref 70–99)
Glucose-Capillary: 121 mg/dL — ABNORMAL HIGH (ref 70–99)

## 2019-08-11 LAB — CBC
HCT: 33.1 % — ABNORMAL LOW (ref 36.0–46.0)
Hemoglobin: 10.8 g/dL — ABNORMAL LOW (ref 12.0–15.0)
MCH: 26.2 pg (ref 26.0–34.0)
MCHC: 32.6 g/dL (ref 30.0–36.0)
MCV: 80.3 fL (ref 80.0–100.0)
Platelets: 214 10*3/uL (ref 150–400)
RBC: 4.12 MIL/uL (ref 3.87–5.11)
RDW: 15.1 % (ref 11.5–15.5)
WBC: 9.9 10*3/uL (ref 4.0–10.5)
nRBC: 0 % (ref 0.0–0.2)

## 2019-08-11 LAB — URINALYSIS, ROUTINE W REFLEX MICROSCOPIC
Bilirubin Urine: NEGATIVE
Glucose, UA: NEGATIVE mg/dL
Hgb urine dipstick: NEGATIVE
Ketones, ur: 20 mg/dL — AB
Nitrite: NEGATIVE
Protein, ur: 30 mg/dL — AB
Specific Gravity, Urine: 1.017 (ref 1.005–1.030)
pH: 6 (ref 5.0–8.0)

## 2019-08-11 LAB — COMPREHENSIVE METABOLIC PANEL
ALT: 23 U/L (ref 0–44)
AST: 24 U/L (ref 15–41)
Albumin: 2.9 g/dL — ABNORMAL LOW (ref 3.5–5.0)
Alkaline Phosphatase: 133 U/L — ABNORMAL HIGH (ref 38–126)
Anion gap: 12 (ref 5–15)
BUN: 6 mg/dL (ref 6–20)
CO2: 18 mmol/L — ABNORMAL LOW (ref 22–32)
Calcium: 9 mg/dL (ref 8.9–10.3)
Chloride: 104 mmol/L (ref 98–111)
Creatinine, Ser: 0.44 mg/dL (ref 0.44–1.00)
GFR calc Af Amer: >60 mL/min (ref >60)
GFR calc non Af Amer: >60 mL/min (ref >60)
Glucose, Bld: 64 mg/dL — ABNORMAL LOW (ref 70–99)
Potassium: 3.4 mmol/L — ABNORMAL LOW (ref 3.5–5.1)
Sodium: 134 mmol/L — ABNORMAL LOW (ref 135–145)
Total Bilirubin: 0.4 mg/dL (ref 0.3–1.2)
Total Protein: 6.7 g/dL (ref 6.5–8.1)

## 2019-08-11 LAB — PROTEIN / CREATININE RATIO, URINE
Creatinine, Urine: 112.29 mg/dL
Protein Creatinine Ratio: 0.13 mg/mg{Cre} (ref 0.00–0.15)
Total Protein, Urine: 15 mg/dL

## 2019-08-11 LAB — SARS CORONAVIRUS 2 (TAT 6-24 HRS): SARS Coronavirus 2: NEGATIVE

## 2019-08-11 LAB — TYPE AND SCREEN
ABO/RH(D): B POS
Antibody Screen: NEGATIVE

## 2019-08-11 MED ORDER — OXYCODONE-ACETAMINOPHEN 5-325 MG PO TABS: 2.0000 | ORAL_TABLET | ORAL | Status: DC | PRN

## 2019-08-11 MED ORDER — LIDOCAINE HCL (PF) 1 % IJ SOLN: 30.0000 mL | INTRAMUSCULAR | Status: DC | PRN

## 2019-08-11 MED ORDER — OXYTOCIN 40 UNITS IN NORMAL SALINE INFUSION - SIMPLE MED: 2.5000 [IU]/h | INTRAVENOUS | Status: DC

## 2019-08-11 MED ORDER — ONDANSETRON HCL 4 MG/2ML IJ SOLN
4.0000 mg | Freq: Four times a day (QID) | INTRAMUSCULAR | Status: DC | PRN
  Administered 2019-08-12: 4 mg via INTRAVENOUS
  Filled 2019-08-11: qty 2

## 2019-08-11 MED ORDER — LACTATED RINGERS IV SOLN: 500.0000 mL | INTRAVENOUS | Status: DC | PRN

## 2019-08-11 MED ORDER — ACETAMINOPHEN 325 MG PO TABS: 650.0000 mg | ORAL_TABLET | ORAL | Status: DC | PRN

## 2019-08-11 MED ORDER — OXYTOCIN BOLUS FROM INFUSION
500.0000 mL | Freq: Once | INTRAVENOUS | Status: AC
  Administered 2019-08-12: 500 mL via INTRAVENOUS

## 2019-08-11 MED ORDER — LACTATED RINGERS IV SOLN
INTRAVENOUS | Status: DC
  Administered 2019-08-11 (×3): via INTRAVENOUS

## 2019-08-11 MED ORDER — ACETAMINOPHEN 325 MG PO TABS
650.0000 mg | ORAL_TABLET | Freq: Once | ORAL | Status: AC
  Administered 2019-08-11: 650 mg via ORAL
  Filled 2019-08-11: qty 2

## 2019-08-11 MED ORDER — TERBUTALINE SULFATE 1 MG/ML IJ SOLN: 0.2500 mg | Freq: Once | INTRAMUSCULAR | Status: DC | PRN

## 2019-08-11 MED ORDER — OXYCODONE-ACETAMINOPHEN 5-325 MG PO TABS: 1.0000 | ORAL_TABLET | ORAL | Status: DC | PRN

## 2019-08-11 MED ORDER — MISOPROSTOL 50MCG HALF TABLET
50.0000 ug | ORAL_TABLET | ORAL | Status: DC
  Administered 2019-08-11: 50 ug via BUCCAL

## 2019-08-11 MED ORDER — MISOPROSTOL 50MCG HALF TABLET
ORAL_TABLET | ORAL | Status: AC
  Filled 2019-08-11: qty 1

## 2019-08-11 MED ORDER — OXYTOCIN 40 UNITS IN NORMAL SALINE INFUSION - SIMPLE MED
1.0000 m[IU]/min | INTRAVENOUS | Status: DC
  Administered 2019-08-11: 2 m[IU]/min via INTRAVENOUS
  Filled 2019-08-11: qty 1000

## 2019-08-11 MED ORDER — FENTANYL CITRATE (PF) 100 MCG/2ML IJ SOLN
100.0000 ug | INTRAMUSCULAR | Status: DC | PRN
  Administered 2019-08-11 – 2019-08-12 (×4): 100 ug via INTRAVENOUS
  Filled 2019-08-11 (×4): qty 2

## 2019-08-11 MED ORDER — SOD CITRATE-CITRIC ACID 500-334 MG/5ML PO SOLN: 30.0000 mL | ORAL | Status: DC | PRN

## 2019-08-11 NOTE — Progress Notes (Signed)
Patient reports fetal movement and irregular contractions. 

## 2019-08-11 NOTE — Progress Notes (Signed)
Labor Progress Note Marie Cabrera is a 23 y.o. G2P1001 at [redacted]w[redacted]d presented for IOL for worsening cHTN. S: Feeling ctx consistently.   O:  BP (!) 149/83   Pulse (!) 103   Temp 98.4 F (36.9 C) (Oral)   Resp 18   Ht 5' (1.524 m)   Wt 70.8 kg   LMP 11/16/2018   SpO2 99%   BMI 30.47 kg/m  EFM: 130, moderate variability, pos accels, no decels, reactive Ctx: q2-43m  CVE: Dilation: 3 Effacement (%): 60 Cervical Position: Posterior Station: -2 Presentation: Vertex Exam by:: Jonne Rote MD   A&P: 23 y.o. G2P1001 [redacted]w[redacted]d here for IOL for worsening cHTN. #Labor: Progressing well. S/p Cytotec x1. Will start Pitocin. Cervix has thinned out some and rotated anteriorly as well. Consider AROM in next 2-4 hours. Anticipate SVD.  #Pain: per patient request #FWB: Cat I; EFW 3200g #GBS negative #cHTN: Pre-E labs negative. No on anti-hypertensive. Normal to mild-range. Serial BP's #GDMA1: last glucose 121; will request repeat 2 hours after although still in active labor #Maternal heart murmur: No abnormalities noted on fetal ultrasound. Fetal echocardiogram ordered and not completed.  Chauncey Mann, MD 10:57 PM

## 2019-08-11 NOTE — Progress Notes (Signed)
   PRENATAL VISIT NOTE  Subjective:  Marie Cabrera is a 23 y.o. G2P1001 at [redacted]w[redacted]d being seen today for ongoing prenatal care.  She is currently monitored for the following issues for this high-risk pregnancy and has Oppositional defiant disorder; Alcohol-induced mood disorder ([place]); Substance abuse ([place]); Mild tetrahydrocannabinol (THC) abuse; Hx of preeclampsia, prior pregnancy, currently pregnant; History of gestational diabetes; Supervision of high risk pregnancy, antepartum; Asthma; Alpha+ thalassemia; Chronic hypertension complicating or reason for care during pregnancy, second trimester; Fall; and Gestational diabetes on their problem list.  Patient reports contractions since this morning.  Contractions: Irregular. Vag. Bleeding: None.  Movement: Present. Denies leaking of fluid.   The following portions of the patient's history were reviewed and updated as appropriate: allergies, current medications, past family history, past medical history, past social history, past surgical history and problem list.   Objective:   Vitals:   08/11/19 1405  BP: (!) 148/90  Pulse: 94  Weight: 156 lb 3.2 oz (70.9 kg)    Fetal Status: Fetal Heart Rate (bpm): NST   Movement: Present  Presentation: Vertex  General:  Alert, oriented and cooperative. Patient is in no acute distress.  Skin: Skin is warm and dry. No rash noted.   Cardiovascular: Normal heart rate noted  Respiratory: Normal respiratory effort, no problems with respiration noted  Abdomen: Soft, gravid, appropriate for gestational age.  Pain/Pressure: Present     Pelvic: Cervical exam performed Dilation: 3 Effacement (%): 60 Station: -3  Extremities: Normal range of motion.     Mental Status: Normal mood and affect. Normal behavior. Normal judgment and thought content.   Assessment and Plan:  Pregnancy: G2P1001 at [redacted]w[redacted]d 1. Supervision of high risk pregnancy, antepartum Possible early labor, NST reactive  2. Diet controlled  gestational diabetes mellitus (GDM) in third trimester Diet controlled  3. Hx of preeclampsia, prior pregnancy, currently pregnant Elevated BP today  4. Chronic hypertension complicating or reason for care during pregnancy, second trimester No meds. To MAU, Dr. Darene Lamer notified  Term labor symptoms and general obstetric precautions including but not limited to vaginal bleeding, contractions, leaking of fluid and fetal movement were reviewed in detail with the patient. Please refer to After Visit Summary for other counseling recommendations.   Return in about 1 week (around 08/18/2019).  Future Appointments  Date Time Provider [place]  08/17/2019  3:30 PM [facility] NURSE [facility] M[facility]  08/17/2019  3:30 PM [place] 1 WH-MFCUS MFC-US  08/18/2019  1:45 PM [place], DO [facility] None    Emeterio Reeve, MD

## 2019-08-11 NOTE — MAU Note (Signed)
.   Marie Cabrera is a 23 y.o. at [redacted]w[redacted]d here in MAU reporting: she was sent over from office for an increase in blood pressure. Reports mild headache. NO meds taken for headache LMP:  Onset of complaint: today Pain score: 4 Vitals:   08/11/19 1546  BP: (!) 154/86  Pulse: 95  Resp: 16  Temp: 98.1 F (36.7 C)  SpO2: 100%     FHT:138 Lab orders placed from triage: UA

## 2019-08-11 NOTE — Patient Instructions (Signed)

## 2019-08-11 NOTE — H&P (Signed)
LABOR AND DELIVERY ADMISSION HISTORY AND PHYSICAL NOTE  Marie Cabrera is a 23 y.o. female G2P1001 with IUP at 34w2dby LMP c/w 16wk [place]oreapresenting for early labor, worsening cHTN.   She reports positive fetal movement. She denies leakage of fluid or vaginal bleeding.   She endorses mild headache, but denies vision changes, chest pain, SOB, LE edema.   She plans on breast and bottle feeding. She requests Nexplanon for birth control.  [facility] , CWD, normal anatomy, cephalic presentation, anterior placenta, 37%ile, EFW 26203T Pregnancy complications:  - cHTN - GDMA1 - asthma, well controlled - alpha thal - hx of PreE in prior pregnancy - ?CHD, ordered for fetal echo but not done  Past Medical History: Past Medical History:  Diagnosis Date  . ADHD (attention deficit hyperactivity disorder)   . Amenorrhea    last menses 2007  . Asthma    last ospitalized >4 years ago.  NO recent problesm  . Heart murmur    [facility].  MD 2 d Echo 23012  . Rectal bleeding     Past Surgical History: Past Surgical History:  Procedure Laterality Date  . CLOSED REDUCTION FINGER WITH PERCUTANEOUS PINNING Left 02/24/2019   Procedure: CLOSED REDUCTION INTERNAL FIXATION MIDDLE PHALANX LEFT INDEX FINGER WITH PERCUTANEOUS PINNING;  Surgeon: CVerner Mould MD;  [facility]  Service: Orthopedics;  Laterality: Left;  . COLONOSCOPY  03/14/2012   Procedure: COLONOSCOPY;  Surgeon: JOletha Blend MD;  [place] [facility]  Service: Gastroenterology;  Laterality: N/A;  . I&D EXTREMITY Left 02/24/2019   Procedure: IRRIGATION AND DEBRIDEMENT LEFT INDEX FINGER;  Surgeon: CVerner Mould MD;  [facility] [facility]  Service: Orthopedics;  Laterality: Left;  . No surgical history      Obstetrical History: OB History    Gravida  2   Para  1   Term  1   Preterm      AB      Living  1     SAB      TAB      Ectopic      Multiple      Live Births  1           Social History: Social History   Socioeconomic History  . Marital status: Single    Spouse name: Not on file  . Number of children: Not on file  . Years of education: Not on file  . Highest education level: Not on file  Occupational History  . 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 Financial resource strain: Not on file  . Food insecurity    Worry: Not on file    Inability: Not on file  . Transportation needs    Medical: Not on file    Non-medical: Not on file  Tobacco Use  . Smoking status: Never Smoker  . Smokeless tobacco: Never Used  Substance and Sexual Activity  . Alcohol use: No  . Drug use: Not Currently    Types: Marijuana    Comment: last use June 2018  . Sexual activity: Yes    Partners: Male    Birth control/protection: Condom    Comment: per patient  Lifestyle  . Physical activity    Days per week: Not on file    Minutes per session: Not on file  . Stress: Not  on file  Relationships  . Social Herbalist on phone: Not on file    Gets together: Not on file    Attends religious service: Not on file    Active member of club or organization: Not on file    Attends meetings of clubs or organizations: Not on file    Relationship status: Not on file  Other Topics Concern  . Not on file  Social History Narrative  . Not on file    Family History: Family History  Adopted: Yes  Problem Relation Age of Onset  . Crohn's disease Maternal Aunt   . Colon polyps Maternal Aunt   . Colon cancer Maternal Grandfather   . Alcohol abuse Mother   . Depression Mother   . Drug abuse Mother   . Mental illness Mother   . Alcohol abuse Father   . Drug abuse Father   . Mental illness Sister   . Alcohol abuse Maternal Grandmother   . Depression Maternal Grandmother   . Diabetes Maternal Grandmother   . Kidney disease Maternal Grandmother   . Other Other         Pt is adopted, only knows about maternal family    Allergies: No Known Allergies  Medications Prior to Admission  Medication Sig Dispense Refill Last Dose  . Accu-Chek FastClix Lancets MISC 1 Device by Percutaneous route 4 (four) times daily. 100 each 12 Past Week at Unknown time  . Prenatal Vit-Fe Phos-FA-Omega (VITAFOL GUMMIES) 3.33-0.333-34.8 MG CHEW Chew 1 tablet by mouth daily. 90 tablet 5 08/10/2019 at Unknown time  . Blood Glucose Monitoring Suppl (ACCU-CHEK GUIDE) w/Device KIT 1 kit by Does not apply route 4 (four) times daily. 1 kit 0   . Blood Pressure Monitoring KIT 1 kit by Does not apply route once a week. 1 kit 0   . glucose blood test strip Use as instructed 100 each 12   . omeprazole (PRILOSEC) 20 MG capsule Take 1 capsule (20 mg total) by mouth daily. (Patient not taking: Reported on 01/09/2019) 30 capsule 0   . potassium chloride SA (K-DUR) 20 MEQ tablet Take 2 tablets (40 mEq total) by mouth 2 (two) times daily. (Patient not taking: Reported on 08/04/2019) 60 tablet 1      Review of Systems  All systems reviewed and negative except as stated in HPI  Physical Exam Blood pressure (!) 136/109, pulse 99, temperature 98.1 F (36.7 C), resp. rate 16, height 5' (1.524 m), last menstrual period 11/16/2018, SpO2 99 %, unknown if currently breastfeeding. General appearance: alert, oriented, NAD Lungs: normal respiratory effort Heart: regular rate Abdomen: soft, non-tender; gravid, leopolds 2800g Extremities: No calf swelling or tenderness Presentation: cephalic by my SVE Fetal monitoringBaseline: 130 bpm, Variability: Good {> 6 bpm), Accelerations: Reactive and Decelerations: Variable: intermittent Uterine activityFrequency: Every 5 minutes    Prenatal labs: ABO, Rh: --/--/B POS (09/10 2351) Antibody: NEG (09/10 2351) Rubella: 3.24 (06/01 1208) RPR: Non Reactive (10/09 0956)  HBsAg: Negative (06/01 1208)  HIV: Non Reactive (10/09 0956)  GC/Chlamydia: neg/neg  07/29/2019  GBS: --[place] (11/18 0450)  2-hr GTT: abnormal ([phone]) Genetic screening:  Low risk female Anatomy US: normal  Prenatal Transfer Tool  Maternal Diabetes: Yes:  Diabetes Type:  Diet controlled Genetic Screening: Normal Maternal Ultrasounds/Referrals: Normal Fetal Ultrasounds or other Referrals:  Fetal echo ordered but not done Maternal Substance Abuse:  No Significant Maternal Medications:  None Significant Maternal Lab Results: Group B Strep negative  Results for orders  placed or performed during the hospital encounter of 08/11/19 (from the past 24 hour(s))  Urinalysis, Routine w reflex microscopic   Collection Time: 08/11/19  4:11 PM  Result Value Ref Range   Color, Urine YELLOW YELLOW   APPearance CLOUDY (A) CLEAR   Specific Gravity, Urine 1.017 1.005 - 1.030   pH 6.0 5.0 - 8.0   Glucose, UA NEGATIVE NEGATIVE mg/dL   Hgb urine dipstick NEGATIVE NEGATIVE   Bilirubin Urine NEGATIVE NEGATIVE   Ketones, ur 20 (A) NEGATIVE mg/dL   Protein, ur 30 (A) NEGATIVE mg/dL   Nitrite NEGATIVE NEGATIVE   Leukocytes,Ua MODERATE (A) NEGATIVE   RBC / HPF 0-5 0 - 5 RBC/hpf   WBC, UA 11-20 0 - 5 WBC/hpf   Bacteria, UA FEW (A) NONE SEEN   Squamous Epithelial / LPF 11-20 0 - 5   Mucus PRESENT    Budding Yeast PRESENT   CBC   Collection Time: 08/11/19  4:22 PM  Result Value Ref Range   WBC 9.9 4.0 - 10.5 K/uL   RBC 4.12 3.87 - 5.11 MIL/uL   Hemoglobin 10.8 (L) 12.0 - 15.0 g/dL   HCT 33.1 (L) 36.0 - 46.0 %   MCV 80.3 80.0 - 100.0 fL   MCH 26.2 26.0 - 34.0 pg   MCHC 32.6 30.0 - 36.0 g/dL   RDW 15.1 11.5 - 15.5 %   Platelets 214 150 - 400 K/uL   nRBC 0.0 0.0 - 0.2 %    Patient Active Problem List   Diagnosis Date Noted  . Gestational diabetes 07/02/2019  . Fall 05/21/2019  . Chronic hypertension complicating or reason for care during pregnancy, second trimester 03/09/2019  . Alpha+ thalassemia 02/23/2019  . Hx of preeclampsia, prior pregnancy, currently pregnant  02/09/2019  . History of gestational diabetes 02/09/2019  . Supervision of high risk pregnancy, antepartum 02/09/2019  . Asthma 02/09/2019  . Mild tetrahydrocannabinol (THC) abuse 11/19/2016  . Alcohol-induced mood disorder ([place]) 01/08/2016  . Substance abuse ([place])   . Oppositional defiant disorder 05/22/2012    Assessment: Marie Cabrera is a 23 y.o. G2P1001 at 47w2dhere for IOL due to worsening cHTN, early labor, GDMA1.   #Labor: 3/60/-3 in office, however on my exam external os 3cm but internal os 1.5cm and cervix still very thick, start induction with misoprostol. Attempted FB but unable to thread, attempt again at next exam.  #Pain: IV pain meds PRN, epidural upon request #FWB: Cat II for variables but overall reassuring w moderate variability and accels #GBS/ID:  Negative #COVID: swab pending #MOF: Breast and bottle #MOC: Nexplanon #Circ:  Yes, outpatient  #cHTN: Not on meds, slightly higher BP's today during MAU stay compared to pregnancy with borderline values close to severe range (usually normotensive to low mild range). CBC/CMP/UPCR unremarkable. Slight headache, treat w tylenol and monitor.   #GDMA1: CBG q4h latent labor, q2h active labor  #Heart murmur: Systolic murmur best heard at upper sternal borders, reports she has been told she has small holes in the heart but unsure of details. Discussed increased risk of fetal cardiac anomalies, likely will have NICU present for delivery.   [facility]Annice Needy[facility]12/09/2018, 4:46 PM

## 2019-08-12 ENCOUNTER — Encounter (HOSPITAL_COMMUNITY): Payer: Self-pay | Admitting: *Deleted

## 2019-08-12 ENCOUNTER — Inpatient Hospital Stay (HOSPITAL_COMMUNITY): Payer: Medicaid Other | Admitting: Anesthesiology

## 2019-08-12 DIAGNOSIS — O1002 Pre-existing essential hypertension complicating childbirth: Secondary | ICD-10-CM

## 2019-08-12 DIAGNOSIS — Z3A38 38 weeks gestation of pregnancy: Secondary | ICD-10-CM

## 2019-08-12 DIAGNOSIS — O24420 Gestational diabetes mellitus in childbirth, diet controlled: Secondary | ICD-10-CM

## 2019-08-12 LAB — CBC
HCT: 32.7 % — ABNORMAL LOW (ref 36.0–46.0)
Hemoglobin: 10.9 g/dL — ABNORMAL LOW (ref 12.0–15.0)
MCH: 26.5 pg (ref 26.0–34.0)
MCHC: 33.3 g/dL (ref 30.0–36.0)
MCV: 79.6 fL — ABNORMAL LOW (ref 80.0–100.0)
Platelets: 227 10*3/uL (ref 150–400)
RBC: 4.11 MIL/uL (ref 3.87–5.11)
RDW: 15.2 % (ref 11.5–15.5)
WBC: 10.1 10*3/uL (ref 4.0–10.5)
nRBC: 0 % (ref 0.0–0.2)

## 2019-08-12 LAB — GLUCOSE, CAPILLARY: Glucose-Capillary: 68 mg/dL — ABNORMAL LOW (ref 70–99)

## 2019-08-12 LAB — RPR: RPR Ser Ql: NONREACTIVE

## 2019-08-12 MED ORDER — SIMETHICONE 80 MG PO CHEW: 80.0000 mg | CHEWABLE_TABLET | ORAL | Status: DC | PRN

## 2019-08-12 MED ORDER — LACTATED RINGERS IV SOLN: 500.0000 mL | Freq: Once | INTRAVENOUS | Status: DC

## 2019-08-12 MED ORDER — WITCH HAZEL-GLYCERIN EX PADS: 1.0000 "application " | MEDICATED_PAD | CUTANEOUS | Status: DC | PRN

## 2019-08-12 MED ORDER — LIDOCAINE HCL 1 % IJ SOLN
0.0000 mL | Freq: Once | INTRAMUSCULAR | Status: AC | PRN
  Administered 2019-08-12: 3 mL via INTRADERMAL
  Filled 2019-08-12: qty 20

## 2019-08-12 MED ORDER — IBUPROFEN 600 MG PO TABS
600.0000 mg | ORAL_TABLET | Freq: Three times a day (TID) | ORAL | Status: DC | PRN
  Administered 2019-08-12 – 2019-08-13 (×4): 600 mg via ORAL
  Filled 2019-08-12 (×4): qty 1

## 2019-08-12 MED ORDER — LIDOCAINE HCL (PF) 1 % IJ SOLN
INTRAMUSCULAR | Status: DC | PRN
  Administered 2019-08-12: 10 mL via EPIDURAL

## 2019-08-12 MED ORDER — EPHEDRINE 5 MG/ML INJ: 10.0000 mg | INTRAVENOUS | Status: DC | PRN

## 2019-08-12 MED ORDER — PRENATAL MULTIVITAMIN CH
1.0000 | ORAL_TABLET | Freq: Every day | ORAL | Status: DC
  Administered 2019-08-12 – 2019-08-13 (×2): 1 via ORAL
  Filled 2019-08-12 (×2): qty 1

## 2019-08-12 MED ORDER — DIPHENHYDRAMINE HCL 50 MG/ML IJ SOLN: 12.5000 mg | INTRAMUSCULAR | Status: DC | PRN

## 2019-08-12 MED ORDER — ACETAMINOPHEN 325 MG PO TABS: 650.0000 mg | ORAL_TABLET | Freq: Four times a day (QID) | ORAL | Status: DC | PRN

## 2019-08-12 MED ORDER — PHENYLEPHRINE 40 MCG/ML (10ML) SYRINGE FOR IV PUSH (FOR BLOOD PRESSURE SUPPORT): 80.0000 ug | PREFILLED_SYRINGE | INTRAVENOUS | Status: DC | PRN

## 2019-08-12 MED ORDER — DIPHENHYDRAMINE HCL 25 MG PO CAPS: 25.0000 mg | ORAL_CAPSULE | Freq: Four times a day (QID) | ORAL | Status: DC | PRN

## 2019-08-12 MED ORDER — FENTANYL-BUPIVACAINE-NACL 0.5-0.125-0.9 MG/250ML-% EP SOLN
12.0000 mL/h | EPIDURAL | Status: DC | PRN
  Filled 2019-08-12: qty 250

## 2019-08-12 MED ORDER — SODIUM CHLORIDE (PF) 0.9 % IJ SOLN
INTRAMUSCULAR | Status: DC | PRN
  Administered 2019-08-12: 12 mL/h via EPIDURAL

## 2019-08-12 MED ORDER — ONDANSETRON HCL 4 MG PO TABS: 4.0000 mg | ORAL_TABLET | ORAL | Status: DC | PRN

## 2019-08-12 MED ORDER — SENNOSIDES-DOCUSATE SODIUM 8.6-50 MG PO TABS
2.0000 | ORAL_TABLET | ORAL | Status: DC
  Administered 2019-08-12: 2 via ORAL
  Filled 2019-08-12: qty 2

## 2019-08-12 MED ORDER — ETONOGESTREL 68 MG ~~LOC~~ IMPL
68.0000 mg | DRUG_IMPLANT | Freq: Once | SUBCUTANEOUS | Status: AC
  Administered 2019-08-12: 68 mg via SUBCUTANEOUS
  Filled 2019-08-12: qty 1

## 2019-08-12 MED ORDER — BENZOCAINE-MENTHOL 20-0.5 % EX AERO: 1.0000 "application " | INHALATION_SPRAY | CUTANEOUS | Status: DC | PRN

## 2019-08-12 MED ORDER — COCONUT OIL OIL: 1.0000 "application " | TOPICAL_OIL | Status: DC | PRN

## 2019-08-12 MED ORDER — ONDANSETRON HCL 4 MG/2ML IJ SOLN: 4.0000 mg | INTRAMUSCULAR | Status: DC | PRN

## 2019-08-12 MED ORDER — MEASLES, MUMPS & RUBELLA VAC IJ SOLR: 0.5000 mL | Freq: Once | INTRAMUSCULAR | Status: DC

## 2019-08-12 MED ORDER — DIBUCAINE (PERIANAL) 1 % EX OINT: 1.0000 "application " | TOPICAL_OINTMENT | CUTANEOUS | Status: DC | PRN

## 2019-08-12 MED ORDER — TETANUS-DIPHTH-ACELL PERTUSSIS 5-2.5-18.5 LF-MCG/0.5 IM SUSP: 0.5000 mL | Freq: Once | INTRAMUSCULAR | Status: DC

## 2019-08-12 NOTE — Progress Notes (Signed)
Labor Progress Note Marie Cabrera is a 23 y.o. G2P1001 at [redacted]w[redacted]d presented for IOL for worsening cHTN. S: Feeling ctx more strongly/intensely.  O:  BP (!) 145/92   Pulse 99   Temp 98.4 F (36.9 C) (Oral)   Resp 16   Ht 5' (1.524 m)   Wt 70.8 kg   LMP 11/16/2018   SpO2 99%   BMI 30.47 kg/m  EFM: 130, moderate variability, pos accels, no decels, reactive Ctx: q65m  CVE: Dilation: 4.5 Effacement (%): 80 Cervical Position: Posterior Station: -2 Presentation: Vertex Exam by:: Leaira Fullam MD   A&P: 23 y.o. G2P1001 [redacted]w[redacted]d here for IOL for worsening cHTN. #Labor: Progressing well. S/p Cytotec x1. Cont Pit. AROM with clear fluid at this exam. Anticipate SVD.  #Pain: per patient request #FWB: Cat I; EFW 3200g #GBS negative #cHTN: Pre-E labs negative. No on anti-hypertensive. Normal to mild-range. Serial BP's #GDMA1: last glucose 68 #Maternal heart murmur: No abnormalities noted on fetal ultrasound. Fetal echocardiogram ordered and not completed.  Chauncey Mann, MD 1:49 AM

## 2019-08-12 NOTE — Anesthesia Procedure Notes (Signed)
Epidural Patient location during procedure: OB Start time: 08/12/2019 2:20 AM End time: 08/12/2019 2:31 AM  Staffing Anesthesiologist: Lidia Collum, MD Performed: anesthesiologist   Preanesthetic Checklist Completed: patient identified, pre-op evaluation, timeout performed, IV checked, risks and benefits discussed and monitors and equipment checked  Epidural Patient position: sitting Prep: DuraPrep Patient monitoring: heart rate, continuous pulse ox and blood pressure Approach: midline Location: L3-L4 Injection technique: LOR air  Needle:  Needle type: Tuohy  Needle gauge: 17 G Needle length: 9 cm Needle insertion depth: 6 cm Catheter type: closed end flexible Catheter size: 19 Gauge Catheter at skin depth: 11 cm Test dose: negative  Assessment Events: blood not aspirated, injection not painful, no injection resistance, negative IV test and no paresthesia  Additional Notes Reason for block:procedure for pain

## 2019-08-12 NOTE — Anesthesia Preprocedure Evaluation (Signed)
Anesthesia Evaluation  Patient identified by MRN, date of birth, ID band Patient awake    Reviewed: Allergy & Precautions, H&P , NPO status , Patient's Chart, lab work & pertinent test results  History of Anesthesia Complications Negative for: history of anesthetic complications  Airway Mallampati: II  TM Distance: >3 FB Neck ROM: full    Dental no notable dental hx.    Pulmonary asthma ,    Pulmonary exam normal        Cardiovascular negative cardio ROS Normal cardiovascular exam Rhythm:regular Rate:Normal     Neuro/Psych negative neurological ROS  negative psych ROS   GI/Hepatic negative GI ROS, Neg liver ROS,   Endo/Other  negative endocrine ROS  Renal/GU negative Renal ROS  negative genitourinary   Musculoskeletal   Abdominal   Peds  Hematology  (+) Blood dyscrasia (alpha thalassemia), anemia ,   Anesthesia Other Findings   Reproductive/Obstetrics (+) Pregnancy                             Anesthesia Physical Anesthesia Plan  ASA: II  Anesthesia Plan: Epidural   Post-op Pain Management:    Induction:   PONV Risk Score and Plan:   Airway Management Planned:   Additional Equipment:   Intra-op Plan:   Post-operative Plan:   Informed Consent: I have reviewed the patients History and Physical, chart, labs and discussed the procedure including the risks, benefits and alternatives for the proposed anesthesia with the patient or authorized representative who has indicated his/her understanding and acceptance.       Plan Discussed with:   Anesthesia Plan Comments:         Anesthesia Quick Evaluation

## 2019-08-12 NOTE — Lactation Note (Signed)
This note was copied from a baby's chart. Lactation Consultation Note  Patient Name: Marie Cabrera Date: 08/12/2019 Reason for consult: Initial assessment;Early term 37-38.6wks Baby is 62 hours old  Per mom baby last fed at 7 am 5 mins at the breast with swallows  And supplemented afterwards with 10 ml of formula.  LC reviewed hand expression with mom and she was able to return demo/ 26ml EBM obtained . Mom aware it can stay out until the next feeding ( 8 hours max) and be spoon fed back to the baby.  Moms feeding preference if breast / formula and expressed she intends to do it at home due to having a 24 year old. 1st baby breast fed 3-4 months and went to only formula/ plans to do the same this time.  LC reviewed the importance of the 1st 2 weeks of breast feeding and establishing her milk supply/ mom expressed she was aware.  LC offered to set up a DEBP due to the baby being an ET infant and since the birth weight is 6-4.5 oz. Mom receptive. [place] set- up the DEBP and since mom is STS and order her breakfast , recommended after the next feeding post pump.  Per mom will need a pump at home, Active with [place] [facility].  Mom consented for LC to send a DEBP referral.  LC provided the LPT / ET hand put and the pamphlet with phone numbers.   Maternal Data Has patient been taught Hand Expression?: Yes  Feeding Feeding Type: (per mom last fed at 7 am and supplemented)  LATCH Score                   Interventions Interventions: Breast feeding basics reviewed  Lactation Tools Discussed/Used WIC Program: Yes(per mom)   Consult Status Consult Status: Follow-up Date: 08/12/19 Follow-up type: In-patient    [place] 08/12/2019, 9:53 AM

## 2019-08-12 NOTE — Anesthesia Postprocedure Evaluation (Signed)
Anesthesia Post Note  Patient: Marie Cabrera  Procedure(s) Performed: AN AD HOC LABOR EPIDURAL     Patient location during evaluation: Mother Baby Anesthesia Type: Epidural Level of consciousness: awake, awake and alert and oriented Pain management: pain level controlled Vital Signs Assessment: post-procedure vital signs reviewed and stable Respiratory status: spontaneous breathing and respiratory function stable Cardiovascular status: blood pressure returned to baseline Postop Assessment: no headache, epidural receding, patient able to bend at knees, adequate PO intake, no backache, no apparent nausea or vomiting and able to ambulate Anesthetic complications: no    Last Vitals:  Vitals:   08/12/19 0534 08/12/19 0625  BP: (!) 150/91 (!) 141/90  Pulse: 96 95  Resp: 18 18  Temp:  37.1 C  SpO2: 100% 98%    Last Pain:  Vitals:   08/12/19 0625  TempSrc: Oral  PainSc:    Pain Goal:                   Bufford Spikes

## 2019-08-12 NOTE — Discharge Summary (Signed)
Postpartum Discharge Summary     Patient Name: Marie Cabrera DOB: [birth year] MRN: [id number]  Date of admission: 08/11/2019 Delivering Provider: Chauncey Mann   Date of discharge: 08/13/2019  Admitting diagnosis: HBP Intrauterine pregnancy: [redacted]w[redacted]d    Secondary diagnosis:  Active Problems:   Hx of preeclampsia, prior pregnancy, currently pregnant   Supervision of high risk pregnancy, antepartum   Asthma   Alpha+ thalassemia   Chronic hypertension complicating or reason for care during pregnancy, second trimester   Gestational diabetes   Encounter for induction of labor  Additional problems: None     Discharge diagnosis: Term Pregnancy Delivered, CHTN and GDM A1                                                                                                Post partum procedures:Nexplanon insertion  Augmentation: AROM, Pitocin and Cytotec  Complications: None  Hospital course:  Induction of Labor With Vaginal Delivery   23y.o. yo G2P1001 at 367w3das admitted to the hospital 08/11/2019 for induction of labor.  Indication for induction: A1 DM and worsening cHTN (no meds).  Patient had an uncomplicated labor course as follows: Initial SVE: 1.5/60/-3. Patient received Cytotec, Pitocin and AROM. Received epidural. She then progressed to complete with uncomplicated delivery. Membrane Rupture Time/Date: 1:48 AM ,08/12/2019   Intrapartum Procedures: Episiotomy: None [1]                                         Lacerations:  None [1]  Patient had delivery of a Viable infant.  Information for the patient's newborn:  AtDarbi, Chandran0[[id number]]Delivery Method: Vag-Spont    08/12/2019  Details of delivery can be found in separate delivery note. Fasting AM glucose 78. BP's monitored and persistently in mild range; Norvasc 5 mg initiated and continued on discharge. BP check scheduled and patient to monitor at home. Nexplanon placed.  Patient had a routine postpartum course.  Patient is discharged home 08/13/19. Delivery time: 3:56 AM    Magnesium Sulfate received: No BMZ received: No Rhophylac:No MMR:No Transfusion:No  Physical exam  Vitals:   08/12/19 1425 08/12/19 1824 08/12/19 2127 08/13/19 0513  BP: 126/76 128/79 137/79 (!) 140/91  Pulse: 80 87 80 77  Resp: 16 17 18 19   Temp: 98.2 F (36.8 C) 98.4 F (36.9 C) 98.4 F (36.9 C) 98.5 F (36.9 C)  TempSrc: Oral Oral  Oral  SpO2: 99%   100%  Weight:      Height:       General: alert, cooperative and no distress Lochia: appropriate Uterine Fundus: firm DVT Evaluation: No evidence of DVT seen on physical exam. Labs: Lab Results  Component Value Date   WBC 10.1 08/12/2019   HGB 10.9 (L) 08/12/2019   HCT 32.7 (L) 08/12/2019   MCV 79.6 (L) 08/12/2019   PLT 227 08/12/2019   CMP Latest Ref Rng & Units 08/11/2019  Glucose 70 - 99 mg/dL 64(L)  BUN 6 - 20  mg/dL 6  Creatinine 0.44 - 1.00 mg/dL 0.44  Sodium 135 - 145 mmol/L 134(L)  Potassium 3.5 - 5.1 mmol/L 3.4(L)  Chloride 98 - 111 mmol/L 104  CO2 22 - 32 mmol/L 18(L)  Calcium 8.9 - 10.3 mg/dL 9.0  Total Protein 6.5 - 8.1 g/dL 6.7  Total Bilirubin 0.3 - 1.2 mg/dL 0.4  Alkaline Phos 38 - 126 U/L 133(H)  AST 15 - 41 U/L 24  ALT 0 - 44 U/L 23    Discharge instruction: per After Visit Summary and "Baby and Me Booklet".  After visit meds:  Allergies as of 08/13/2019   No Known Allergies     Medication List    STOP taking these medications   Accu-Chek FastClix Lancets Misc   Accu-Chek Guide w/Device Kit   glucose blood test strip   omeprazole 20 MG capsule Commonly known as: PRILOSEC   potassium chloride SA 20 MEQ tablet Commonly known as: KLOR-CON     TAKE these medications   acetaminophen 325 MG tablet Commonly known as: Tylenol Take 2 tablets (650 mg total) by mouth every 6 (six) hours as needed (for pain scale < 4).   amLODipine 5 MG tablet Commonly known as: NORVASC Take 1 tablet (5 mg total) by mouth daily.    Blood Pressure Monitoring Kit 1 kit by Does not apply route once a week.   ibuprofen 600 MG tablet Commonly known as: ADVIL Take 1 tablet (600 mg total) by mouth every 8 (eight) hours as needed for mild pain.   Vitafol Gummies 3.33-0.333-34.8 MG Chew Chew 1 tablet by mouth daily.       Diet: routine diet  Activity: Advance as tolerated. Pelvic rest for 6 weeks.   Outpatient follow up:4 weeks Follow up Appt: Future Appointments  Date Time Provider [place]  08/21/2019 10:00 AM [place] None  09/09/2019  2:00 PM Constant, Peggy, MD [place] None  09/23/2019  8:30 AM [facility] None   Follow up Visit:   Please schedule this patient for Postpartum visit in: 4 weeks with the following provider: Any provider High risk pregnancy complicated by: GDM, CHTN Delivery mode:  SVD Anticipated Birth Control:  Nexplanon PP Procedures needed: BP and GTT  Schedule Integrated BH visit: no    Newborn Data: Live born female  Birth Weight: 2850g   APGAR: 19, 9  Newborn Delivery   Birth date/time: 08/12/2019 03:56:00 Delivery type: Vaginal, Spontaneous      Baby Feeding: Bottle and Breast Disposition:home with mother   08/13/2019 Chauncey Mann, MD

## 2019-08-13 DIAGNOSIS — Z3046 Encounter for surveillance of implantable subdermal contraceptive: Secondary | ICD-10-CM

## 2019-08-13 LAB — GLUCOSE, CAPILLARY: Glucose-Capillary: 78 mg/dL (ref 70–99)

## 2019-08-13 MED ORDER — AMLODIPINE BESYLATE 5 MG PO TABS: 5.0000 mg | ORAL_TABLET | Freq: Every day | ORAL | 0 refills | Status: AC

## 2019-08-13 MED ORDER — ACETAMINOPHEN 325 MG PO TABS: 650.0000 mg | ORAL_TABLET | Freq: Four times a day (QID) | ORAL | 0 refills | Status: DC | PRN

## 2019-08-13 MED ORDER — IBUPROFEN 600 MG PO TABS: 600.0000 mg | ORAL_TABLET | Freq: Three times a day (TID) | ORAL | 0 refills | Status: DC | PRN

## 2019-08-13 MED ORDER — AMLODIPINE BESYLATE 5 MG PO TABS
5.0000 mg | ORAL_TABLET | Freq: Every day | ORAL | Status: DC
  Administered 2019-08-13: 5 mg via ORAL
  Filled 2019-08-13: qty 1

## 2019-08-13 MED FILL — AMLODIPINE BESYLATE 5 MG TA: 5 | 90 days supply | Qty: 90 | Fill #0

## 2019-08-13 MED FILL — ACETAMINOPHEN 325 MG TABS: 325 | 12 days supply | Qty: 90 | Fill #0

## 2019-08-13 MED FILL — IBUPROFEN 600 MG TABLET: 600 | 30 days supply | Qty: 90 | Fill #0

## 2019-08-13 NOTE — Progress Notes (Addendum)
Post Partum Day 1 Subjective: Feeling well and would like to discharge home if baby can go. Patient reports feeling well. She is tolerating PO. Ambulating and urinating without difficulty. Lochia minimal.  Objective: Blood pressure (!) 140/91, pulse 77, temperature 98.5 F (36.9 C), temperature source Oral, resp. rate 19, height 5' (1.524 m), weight 70.8 kg, last menstrual period 11/16/2018, SpO2 100 %, unknown if currently breastfeeding.  Physical Exam:  General: alert, cooperative and appears stated age 22: appropriate Uterine Fundus: firm DVT Evaluation: No evidence of DVT seen on physical exam.  Recent Labs    08/11/19 1622 08/12/19 0155  HGB 10.8* 10.9*  HCT 33.1* 32.7*    Assessment/Plan: Discharge home; okay to cancel DC orders if baby has to stay BP's in mild range; Norvasc 5 mg initiated and d/w patient; monitor at home and BP check in clinic in 1-2 weeks. Will likely need long-term management due to hx of cHTN on no meds. Nexplanon placed Breast and bottle feeding Fasting AM glucose 78; GTT in 4-6 weeks   LOS: 2 days   Lynel Forester N Lyndzee Kliebert 08/13/2019, 6:35 AM

## 2019-08-13 NOTE — Lactation Note (Signed)
This note was copied from a baby's chart. Lactation Consultation Note  Patient Name: Boy Jaydah Stahle NOTRR'N Date: 08/13/2019 -  Reason for consult: Follow-up assessment;Early term 37-38.6wks;Infant weight loss Per 73 hours old and mom is requesting early D/C today .  Per mom baby went for a echo.  Per mom baby last fed at 1005 for 17 mins. With swallows and was not  Supplemented afterwards.  Per mom has a DEBP at home and plans to do both.  Denies sore nipples and breast are getting heavier today.  Sore nipple and engorgement prevention and tx reviewed.  Storage of breast milk.  Mom has the [facility] pamphlet with resource phone numbers.   Maternal Data    Feeding Feeding Type: (per mom baby recently fed at 106 fro 17 mins)  LATCH Score                   Interventions Interventions: Breast feeding basics reviewed  Lactation Tools Discussed/Used Tools: Pump Breast pump type: Double-Electric Breast Pump Pump Review: Milk Storage   Consult Status Consult Status: Complete Date: 08/13/19    Myer Haff 08/13/2019, 12:18 PM

## 2019-08-13 NOTE — Clinical Social Work Maternal (Signed)
CLINICAL SOCIAL WORK MATERNAL/CHILD NOTE  Patient Details  Name: Marie Cabrera MRN: [id number] Date of Birth: 1995-09-18  Date:  08/13/2019  Clinical Social Worker Initiating Note:  Hortencia Pilar, LCSW Date/Time: Initiated:  08/13/19/0900     Child's Name:  Marie Cabrera   Biological Parents:  Mother, Father   Need for Interpreter:  None   Reason for Referral:  Current Substance Use/Substance Use During Pregnancy    Address:  [address] [place] [address]    Phone number:  [phone] (home)     Additional phone number: none   Household Members/Support Persons (HM/SP):   Household Member/Support Person 2   HM/SP Name Relationship DOB or Age  HM/SP -1   Marie Cabrera  sister   38  HM/SP -2 Marie Cabrera MGM    HM/SP -3   Marie Cabrera  brother   6  HM/SP -4  Marie Cabrera     24  HM/SP -5        HM/SP -6        HM/SP -7        HM/SP -8          Natural Supports (not living in the home):  Other (Comment)([name]'s family.)   Professional Supports: None   Employment: Full-time   Type of Work: Teacher, 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   Strengths:  Home prepared for child , Compliance with medical plan , Ability to meet basic needs , Pediatrician chosen   Psychotropic Medications:     None reported.     Pediatrician:     [facility]   Pediatrician List:   [facility]    [place]    [facility]      Pediatrician Fax Number:    Risk Factors/Current Problems:  None   Cognitive State:  Alert , Able to Concentrate , Insightful    Mood/Affect:  Relaxed , Comfortable , Calm , Bright , Happy , Interested    CSW Assessment: CSW consulted as [name]  use THC during pregnancy. CSW went to speak with her at bedside. Upon entering the room, CSW congratulated [name] and [name] on the birth of infant. CSW advised [name] of the HIPPA policy and [name] reported that it was fine for [name] to remain in the room. CSW understanding and proceeded with assessing [name] for further needs.    CSW advised [name] of the reason for CSW coming to visit with her. [name] reported that she used THC a couple a months ago, [name] reported that she unable to recall the exact date. CSW understanding. [name] went on to inform CSW that she used THC in order to keep from vomiting. [name] reported that this was her only reason for using. CSW understanding but also reported to [facility] the hospital drug screen policy. [name] was informed that infants UDS is negative, however CDS is still pending. CSW advised [name] that if CDS is positive then a CPS report would need to be made. [name] reported understanding with no further questions.  CSW inquired from [facility] on her mental health history. [name] reported that she was diagnosed with ADHD after her biological mother passed away [name] reported that  her new mom was very nice to take her and other children in. [name] reported that she has been on medication in the past, however not on anything at this time. [name] reported that she has been feeling finesince giving birth. [name] denies SI or HI.   CSW provided [name] With PPD and SIDS education. [name] was given PPD Checklist in order to keep track of her feelings as they relate to PPD. [name] reported that she has all needed items to care for infant with no other needs.  CSW will continue to monitor infants CDS for needed CPS report.   CSW Plan/Description:  No Further Intervention Required/No Barriers to Discharge, Sudden Infant Death Syndrome (SIDS) Education, Perinatal Mood and Anxiety Disorder (PMADs) Education, [place], CSW Will Continue to Monitor Umbilical Cord Tissue Drug Screen Results and Make Report if [place] Reining 08/13/2019, 9:33 AM

## 2019-08-17 ENCOUNTER — Ambulatory Visit (HOSPITAL_COMMUNITY): Payer: Medicaid Other

## 2019-08-18 ENCOUNTER — Encounter: Payer: Medicaid Other | Admitting: Family Medicine

## 2019-08-21 ENCOUNTER — Ambulatory Visit (INDEPENDENT_AMBULATORY_CARE_PROVIDER_SITE_OTHER): Payer: Medicaid Other

## 2019-08-21 ENCOUNTER — Other Ambulatory Visit: Payer: Self-pay

## 2019-08-21 VITALS — BP 124/85 | HR 88 | Wt 143.1 lb

## 2019-08-21 DIAGNOSIS — Z0130 Encounter for examination of blood pressure without abnormal findings: Secondary | ICD-10-CM

## 2019-08-21 DIAGNOSIS — O169 Unspecified maternal hypertension, unspecified trimester: Secondary | ICD-10-CM

## 2019-08-21 NOTE — Progress Notes (Signed)
Pt is here for PP BP check. She is 9 days PP vaginal deliver. BP is 124/85 today. Pt is currently taking Amlodipine 5 mg daily. I advised pt to continue taking the medication prescribed to her until her postpartum visit on 12/30. Pt denies HA, blurry vision, or swelling. I advise pt to call or go to hospital for evaluation if any of these symptoms arise. Pt voices understanding. Pt reports she has been bleeding off and on since she came home from the hospital. I advise pt that some bleeding after delivery is normal. I advised pt that if she is bleeding heavily and filling up more than 1 pad per hour she would need to go to MAU for evaluation. Pt voices understanding.

## 2019-08-25 ENCOUNTER — Encounter: Payer: Medicaid Other | Admitting: Obstetrics & Gynecology

## 2019-08-25 NOTE — Procedures (Signed)
   PROCEDURE NOTE  [name] is a 23 y.o. [id number] now PPD#1 who desires Nexplanon placement.   Nexplanon Insertion Procedure Patient identified, informed consent performed, consent signed.   Patient does understand that irregular bleeding is a very common side effect of this medication. She was advised to have backup contraception for one week after placement. Appropriate time out taken.  Patient's left arm was prepped and draped in the usual sterile fashion. The ruler used to measure and mark insertion area.  Patient was prepped with alcohol swab and then injected with 3 ml of 1% lidocaine.  She was prepped with betadine, Nexplanon removed from packaging,  Device confirmed in needle, then inserted full length of needle and withdrawn per handbook instructions. Nexplanon was able to palpated in the patient's arm; patient palpated the insert herself. There was minimal blood loss.  Patient insertion site covered with guaze and a pressure bandage to reduce any bruising.  The patient tolerated the procedure well and was given post procedure instructions.   Barrington Ellison, MD [facility] Fellow, [facility] for [employer], [place]

## 2019-09-09 ENCOUNTER — Encounter: Payer: Self-pay | Admitting: Obstetrics and Gynecology

## 2019-09-09 ENCOUNTER — Telehealth (INDEPENDENT_AMBULATORY_CARE_PROVIDER_SITE_OTHER): Payer: Medicaid Other | Admitting: Obstetrics and Gynecology

## 2019-09-09 DIAGNOSIS — Z1289 Encounter for screening for malignant neoplasm of other sites: Secondary | ICD-10-CM

## 2019-09-09 NOTE — Progress Notes (Deleted)
Post Partum Exam  Marie Cabrera is a 23 y.o. G71P2002 female who presents for a postpartum visit. She is 4 weeks postpartum following a spontaneous vaginal delivery. I have fully reviewed the prenatal and intrapartum course. The delivery was at 49 gestational weeks.  Anesthesia: epidural. Postpartum course has been Unremarkable. Baby's course has been Unremarkable. Baby is feeding by Bottle [employer] Start. Bleeding no bleeding. Bowel function is normal. Bladder function is normal. Patient is not sexually active. Contraception method is Nexplanon. Postpartum depression screening:neg EPDS =0  {Common ambulatory SmartLinks:19316} Last pap smear done 11/12/2016 and was Normal  Review of Systems {ros; complete:30496}    Objective:  Last menstrual period 11/16/2018, not currently breastfeeding.  General:  {gen appearance:16600}   Breasts:  {breast exam:1202::"inspection negative, no nipple discharge or bleeding, no masses or nodularity palpable"}  Lungs: {lung exam:16931}  Heart:  {heart exam:5510}  Abdomen: {abdomen exam:16834}   Vulva:  {labia exam:12198}  Vagina: {vagina exam:12200}  Cervix:  {cervix exam:14595}  Corpus: {uterus exam:12215}  Adnexa:  {adnexa exam:12223}  Rectal Exam: {rectal/vaginal exam:12274}        Assessment:    *** postpartum exam. Pap smear {done:10129} at today's visit.   Plan:   1. Contraception: {method:5051} 2. *** 3. Follow up in: {1-10:13787} {time; units:19136} or as needed.

## 2019-09-09 NOTE — Progress Notes (Signed)
TELEHEALTH POSTPARTUM VIRTUAL VIDEO VISIT ENCOUNTER NOTE   Provider location: Center for [employer] at [place]   I connected with [name] on 09/09/19 at  2:00 PM EST by MyChart Video Encounter at home and verified that I am speaking with the correct person using two identifiers.    I discussed the limitations, risks, security and privacy concerns of performing an evaluation and management service virtually and the availability of in person appointments. I also discussed with the patient that there may be a patient responsible charge related to this service. The patient expressed understanding and agreed to proceed.  Chief Complaint: Postpartum Visit  History of Present Illness:  Jovanna MERCADEZ HEITMAN is a 23 y.o. female who presents for a postpartum visit. She is 4 weeks postpartum following a spontaneous vaginal delivery. I have fully reviewed the prenatal and intrapartum course. The delivery was at 6 gestational weeks. Outcome: spontaneous vaginal delivery. Anesthesia: epidural. Postpartum course has been uncomplicated. Baby's course has been uncomplicated. Baby is feeding by bottle Dory Horn. Bleeding no bleeding. Bowel function is normal. Bladder function is normal. Patient is not sexually active. Contraception method is Nexplanon. Postpartum depression screening: negative.  Review of Systems: Her 12 point review of systems is negative or as noted in the History of Present Illness.  Patient Active Problem List   Diagnosis Date Noted  . Encounter for induction of labor 08/11/2019  . Gestational diabetes 07/02/2019  . Fall 05/21/2019  . Chronic hypertension complicating or reason for care during pregnancy, second trimester 03/09/2019  . Alpha+ thalassemia 02/23/2019  . Hx of preeclampsia, prior pregnancy, currently pregnant 02/09/2019  . History of gestational diabetes 02/09/2019  . Supervision of high risk pregnancy, antepartum 02/09/2019  . Asthma 02/09/2019  . Mild  tetrahydrocannabinol (THC) abuse 11/19/2016  . Alcohol-induced mood disorder ([place]) 01/08/2016  . Substance abuse ([place])   . Oppositional defiant disorder 05/22/2012    Medications [place] N. [place] had no medications administered during this visit. Current Outpatient Medications  Medication Sig Dispense Refill  . acetaminophen (TYLENOL) 325 MG tablet Take 2 tablets (650 mg total) by mouth every 6 (six) hours as needed (for pain scale < 4). 90 tablet 0  . amLODipine (NORVASC) 5 MG tablet Take 1 tablet (5 mg total) by mouth daily. 90 tablet 0  . Blood Pressure Monitoring KIT 1 kit by Does not apply route once a week. 1 kit 0  . ibuprofen (ADVIL) 600 MG tablet Take 1 tablet (600 mg total) by mouth every 8 (eight) hours as needed for mild pain. 90 tablet 0  . Prenatal Vit-Fe Phos-FA-Omega (VITAFOL GUMMIES) 3.33-0.333-34.8 MG CHEW Chew 1 tablet by mouth daily. 90 tablet 5   No current facility-administered medications for this visit.    Allergies Patient has no known allergies.  Physical Exam:  LMP 11/16/2018   General:  Alert, oriented and cooperative. Patient is in no acute distress.  Mental Status: Normal mood and affect. Normal behavior. Normal judgment and thought content.   Respiratory: Normal respiratory effort noted, no problems with respiration noted  Rest of physical exam deferred due to type of encounter  PP Depression Screening:   Edinburgh Postnatal Depression Scale Screening Tool 08/12/2019 07/15/2017  I have been able to laugh and see the funny side of things. 0 0  I have looked forward with enjoyment to things. 0 0  I have blamed myself unnecessarily when things went wrong. 0 0  I have been anxious or worried for no  good reason. 0 2  I have felt scared or panicky for no good reason. 0 0  Things have been getting on top of me. 0 0  I have been so unhappy that I have had difficulty sleeping. 0 0  I have felt sad or miserable. 0 0  I have been so unhappy that I have been  crying. 0 0  The thought of harming myself has occurred to me. 0 0  Edinburgh Postnatal Depression Scale Total 0 2     Assessment:Patient is a 23 y.o. [id number] who is 4 weeks postpartum from a normal spontaneous vaginal delivery.  She is doing well.   Plan:  1. Routine postpartum follow-up Patient is medically cleared to resume all activities of daily living RTC in 2 weeks for postpartum glucola given diagnosis of GDM in pregnancy. Patient due for pap smear in March  I discussed the assessment and treatment plan with the patient. The patient was provided an opportunity to ask questions and all were answered. The patient agreed with the plan and demonstrated an understanding of the instructions.   The patient was advised to call back or seek an in-person evaluation/go to the ED for any concerning postpartum symptoms.  I provided 11 minutes of face-to-face time during this encounter.   Mora Bellman, MD Center for [facility]

## 2019-09-23 ENCOUNTER — Other Ambulatory Visit: Payer: Medicaid Other

## 2019-10-06 ENCOUNTER — Other Ambulatory Visit: Payer: Medicaid Other

## 2020-01-17 IMAGING — CR LEFT INDEX FINGER 2+V
3 series · 3 of 3 positions shown · non-contrast
Comparison: None.

CLINICAL DATA: Crush injury

EXAM:
LEFT INDEX FINGER 2+V

[finger ap]
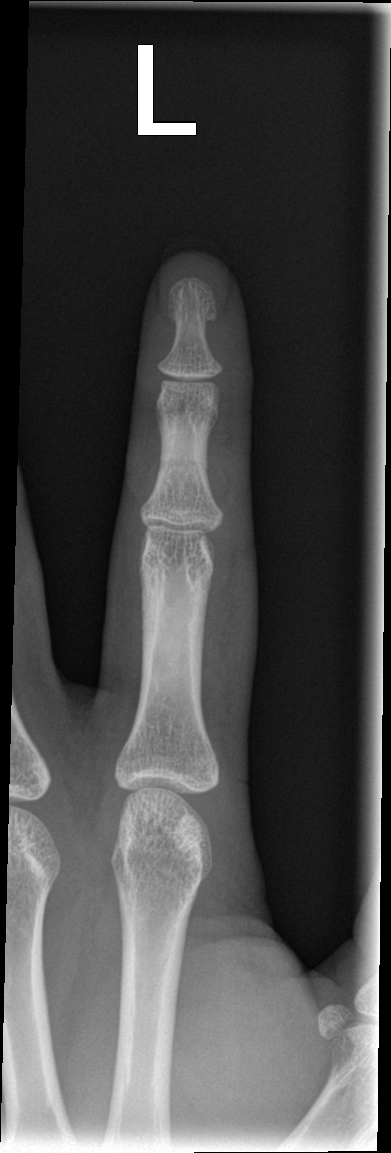

[finger obl]
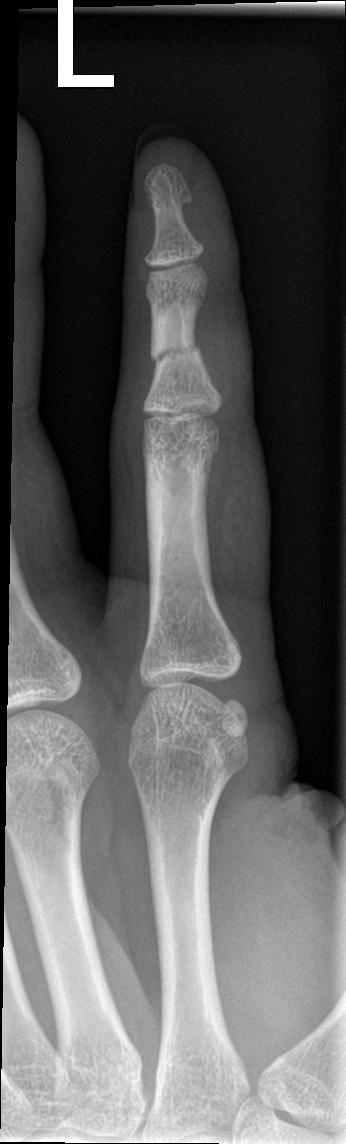

[finger lat]
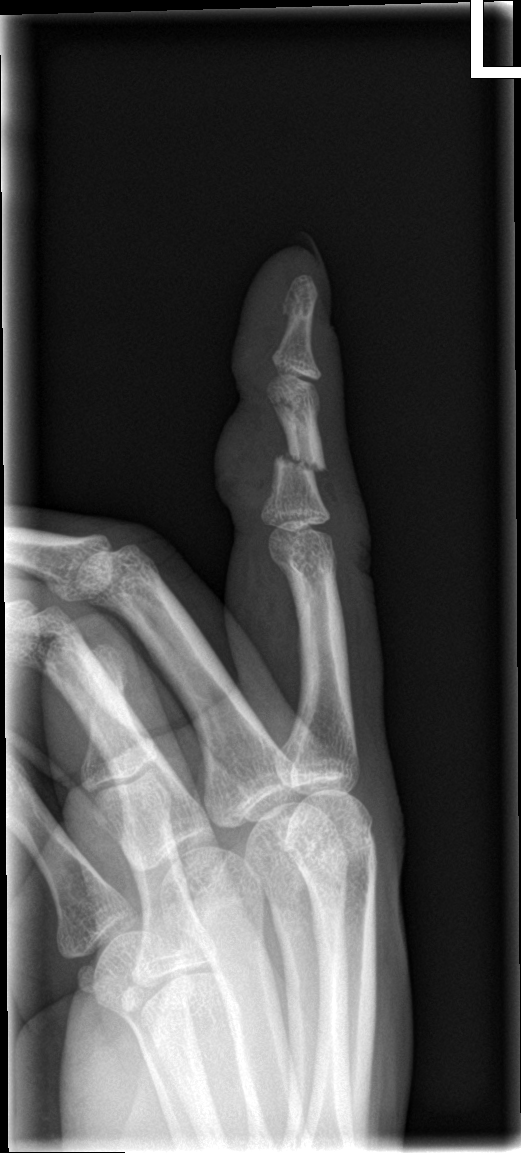

[3 of 3 positions shown; findings below may reference images not displayed]

FINDINGS: There is soft tissue swelling about the second digit. There is an
acute displaced fracture through the mid shaft of the middle
phalanx. There appears to be a laceration involving the palmar
aspect of the second digit.
IMPRESSION: Acute displaced fracture involving the middle phalanx of the second
digit with surrounding soft tissue swelling and likely associated
lacerations.

## 2020-04-11 ENCOUNTER — Ambulatory Visit: Payer: Medicaid Other | Admitting: Obstetrics and Gynecology

## 2020-06-04 IMAGING — US US MFM FETAL BPP W/ NON-STRESS
1 series · 14 of 21 positions shown · non-contrast
Comparison: none

[Series 1: us mfm fetal bpp w/ non-stress · 21 acquisitions, 14 frames shown]
[im 1/21]
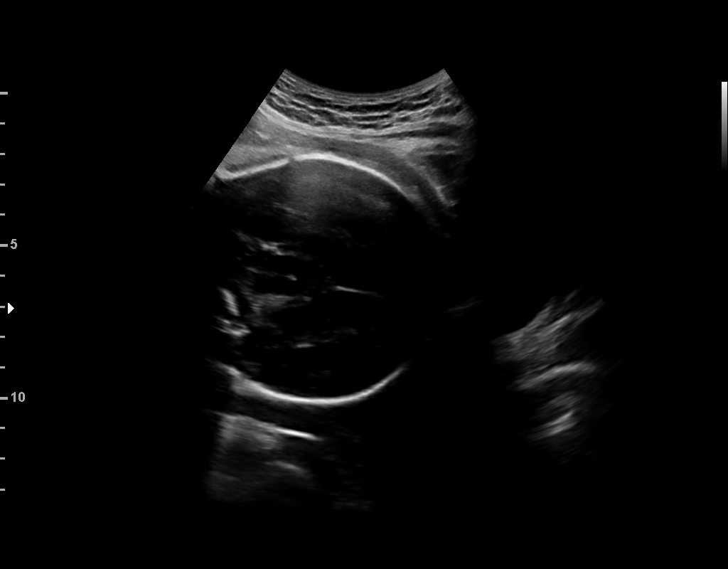
[im 3/21]
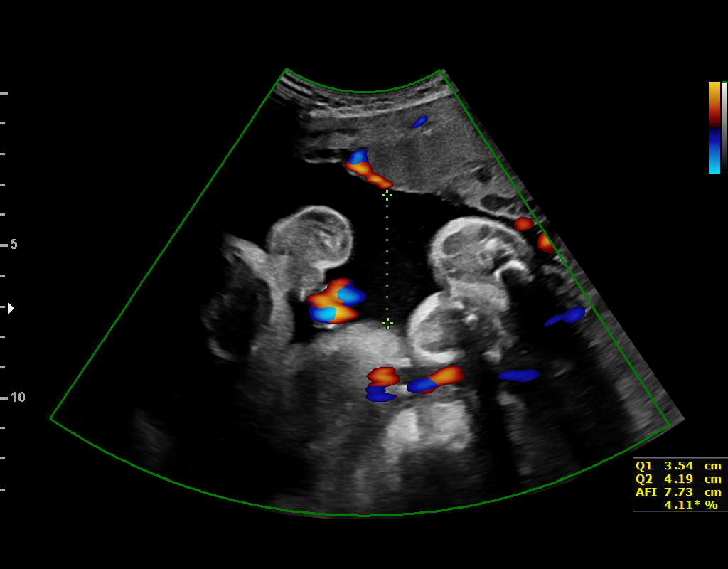
[im 4/21]
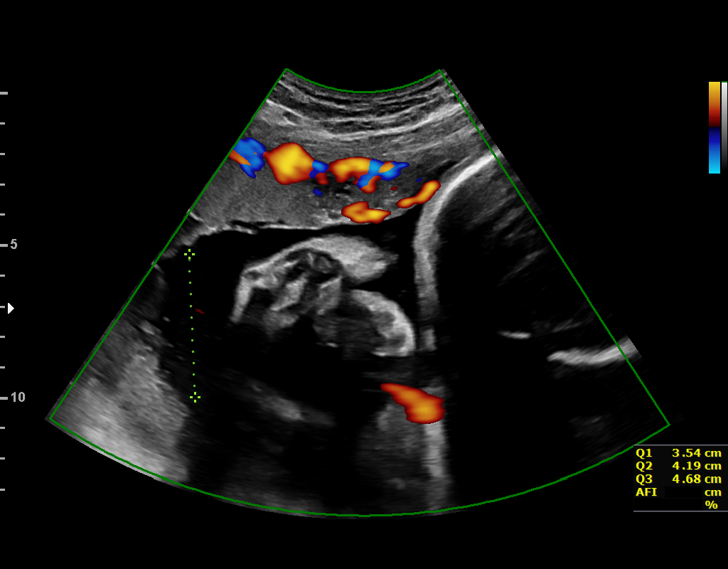
[im 6/21]
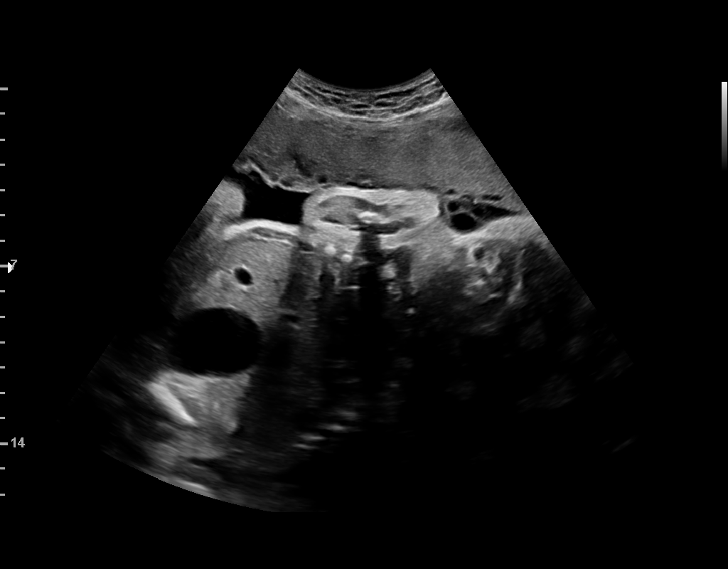
[im 7/21]
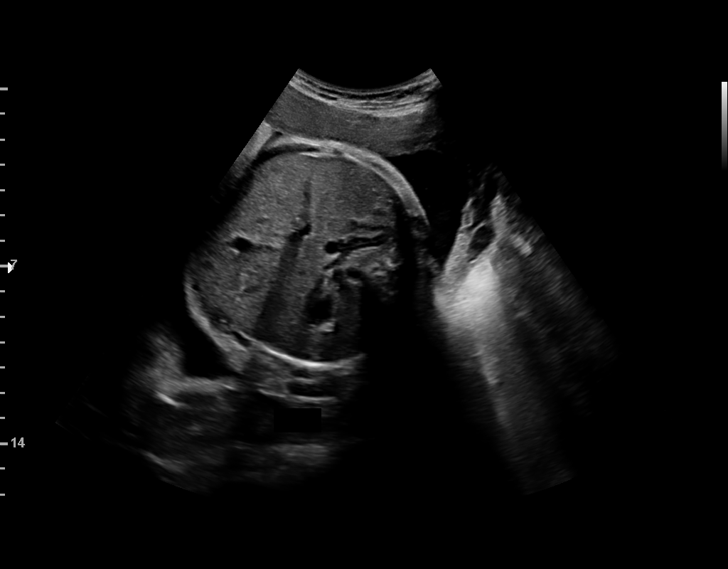
[im 9/21]
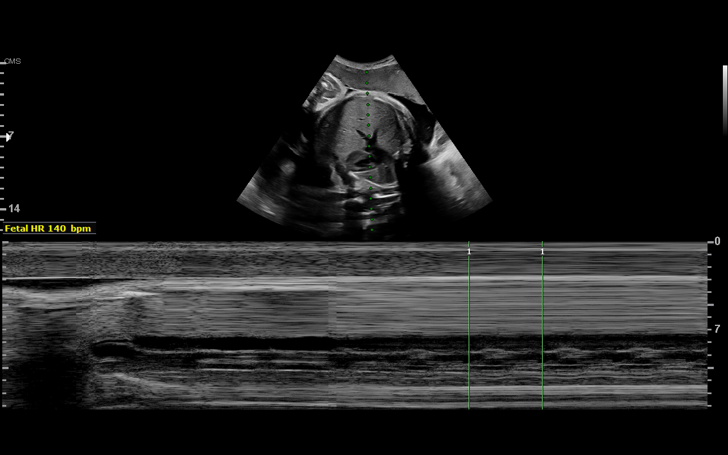
[im 10/21]
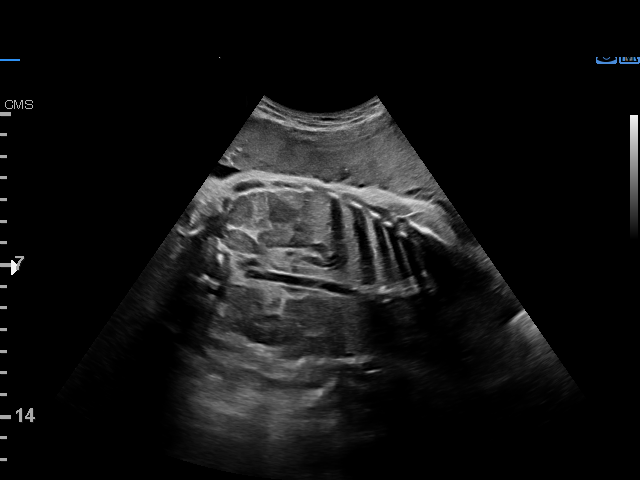
[im 12/21]
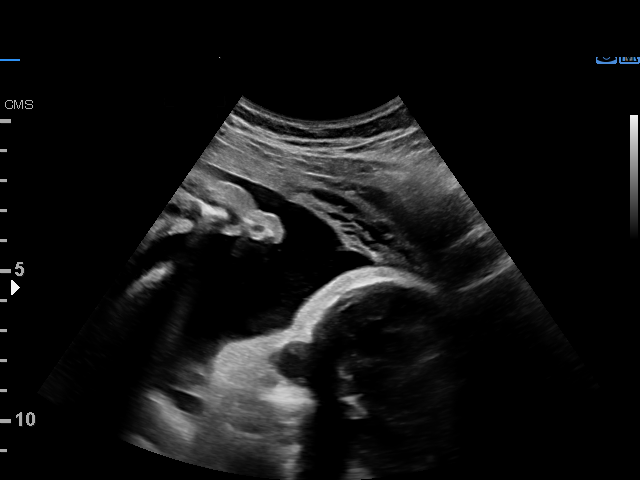
[im 13/21]
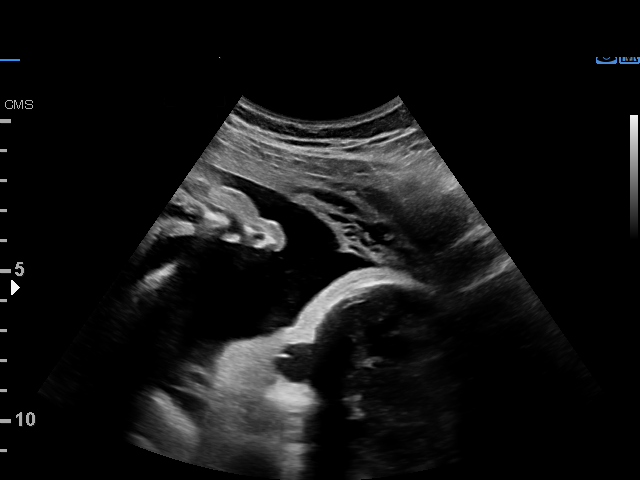
[im 15/21]
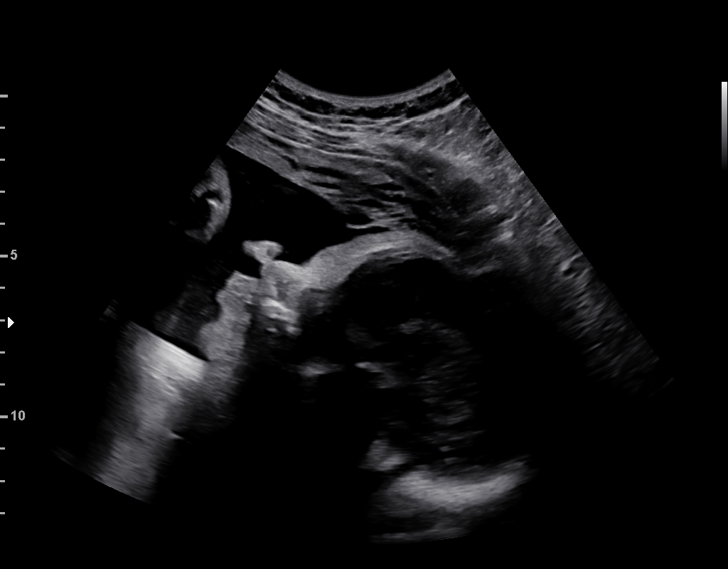
[im 16/21]
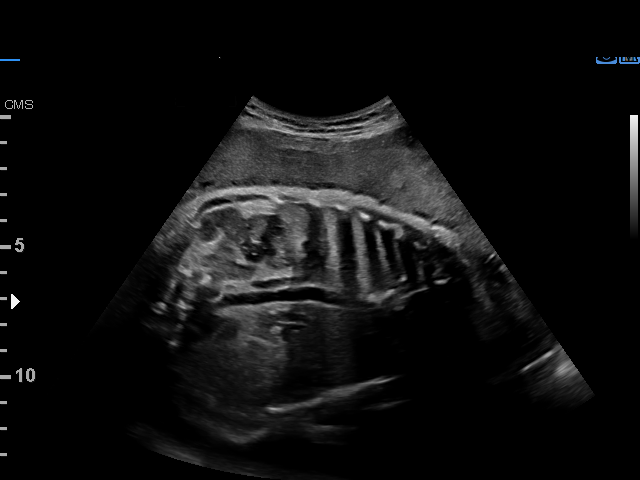
[im 18/21]
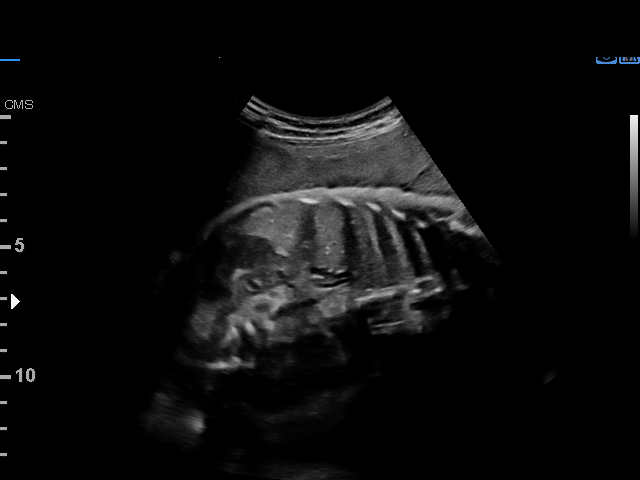
[im 19/21]
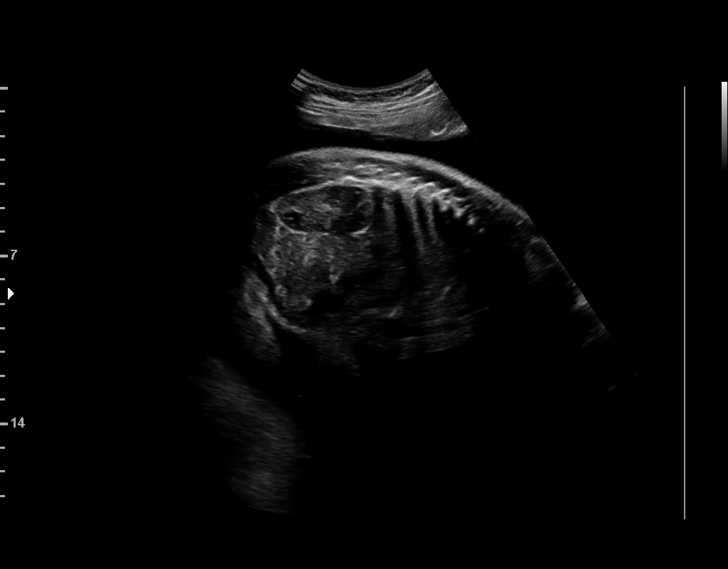
[im 21/21]
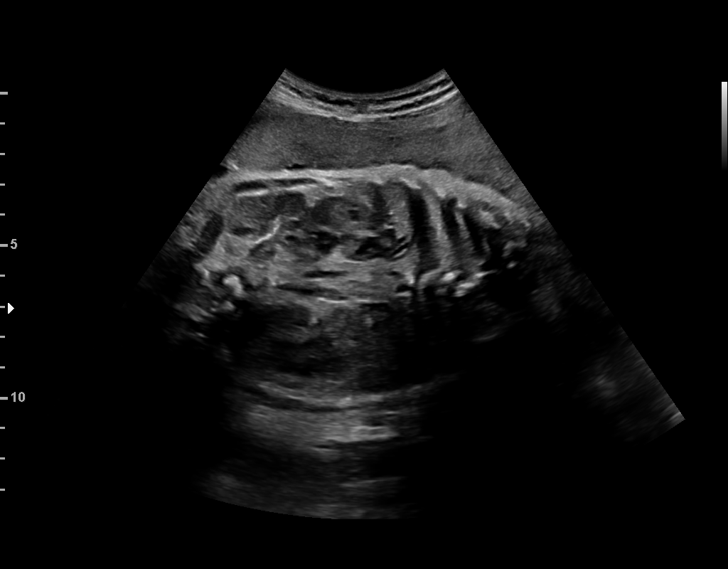

[14 of 21 positions shown; findings below may reference images not displayed]

[address]
                   [id number]

     W/NONSTRESS
 ----------------------------------------------------------------------

 ----------------------------------------------------------------------
Indications

  Encounter for other specified antenatal
  screening
  Hypertension - Chronic/Pre-existing
  (Labetalol)(Low Risk NIPS)
  Abnormal fetal ultrasound ([DATE] BPP)
  Poor obstetric history: Previous
  preeclampsia / eclampsia/gestational HTN
  Poor obstetric history: Previous gestational
  diabetes
  Genetic carrier (silent Tonie Sakai)
  34 weeks gestation of pregnancy
 ----------------------------------------------------------------------
Vital Signs

                                                Height:        5'
Fetal Evaluation

 Num Of Fetuses:         1
 Fetal Heart Rate(bpm):  140
 Cardiac Activity:       Observed
 Presentation:           Cephalic

 Amniotic Fluid
 AFI FV:      Within normal limits

 AFI Sum(cm)     %Tile       Largest Pocket(cm)
 14.17           50
 RUQ(cm)       RLQ(cm)       LUQ(cm)        LLQ(cm)

Biophysical Evaluation

 Amniotic F.V:   Within normal limits       F. Tone:        Observed
 F. Movement:    Observed                   N.S.T:          Reactive
 F. Breathing:   Not Observed               Score:          [DATE]
OB History

 Gravidity:    2         Term:   1        Prem:   0        SAB:   0
 TOP:          0       Ectopic:  0        Living: 1
Gestational Age

 LMP:           34w 1d        Date:  11/16/18                 EDD:   08/23/19
 Best:          34w 1d     Det. By:  LMP  (11/16/18)          EDD:   08/23/19
Impression

 Chronic hypertension. Labetalol was discontinued 2 months
 ago.

 Amniotic fluid is normal and good fetal activity is seen. Fetal
 breathing movements did not meet the criteria of BPP. NST is
 reactive. BPP [DATE].

 BP at our office: 155/75 mm Hg and (repeat) 132/72 mm Hg.
 preeclampsia. She reports she had blood pressure cuff at
 home.
Recommendations

 -Continue weekly BPP till delivery.
                 Inua, Kingzy

## 2020-06-19 IMAGING — US US MFM FETAL BPP W/O NON-STRESS
1 series · 14 of 28 positions shown · non-contrast
Comparison: none

[Series 1: us mfm fetal bpp w/o non-stress · 35 acquisitions, 14 frames shown]
[im 2/35]
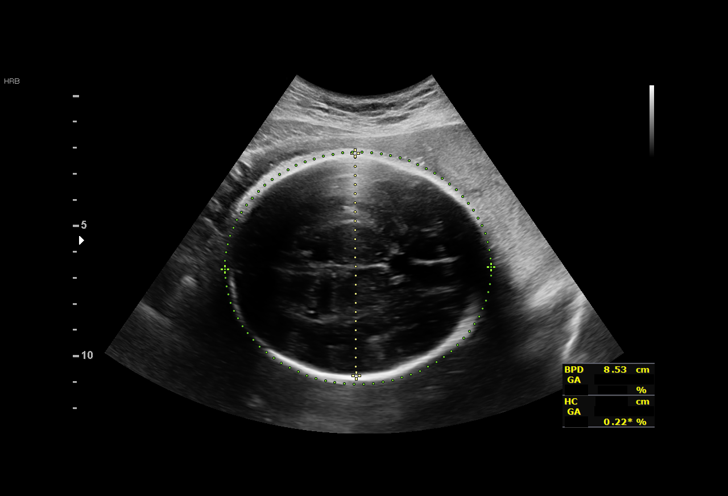
[im 4/35]
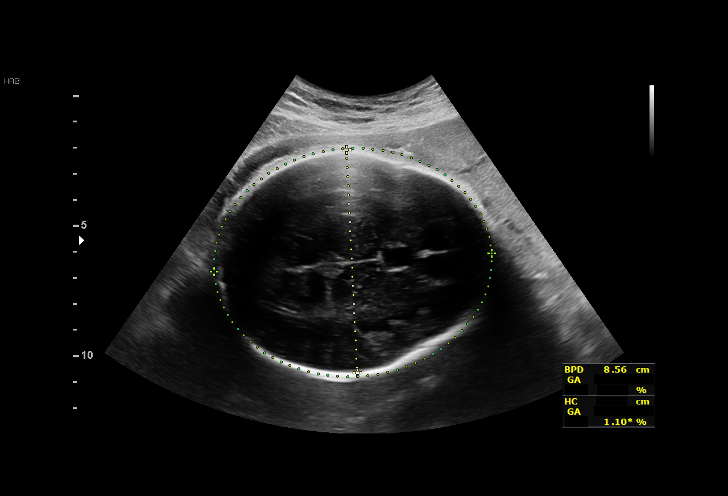
[im 7/35]
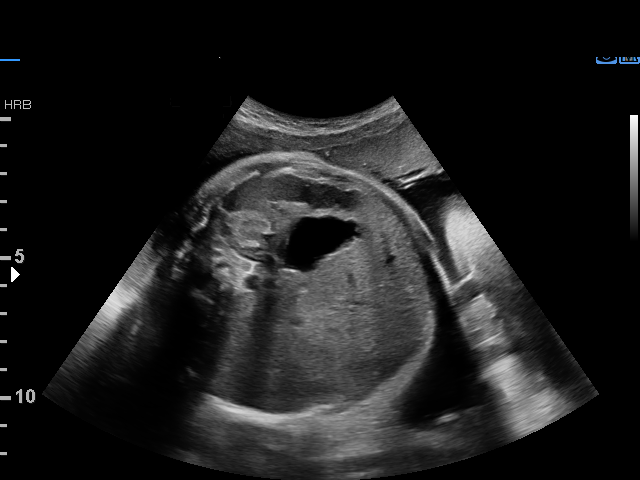
[im 9/35]
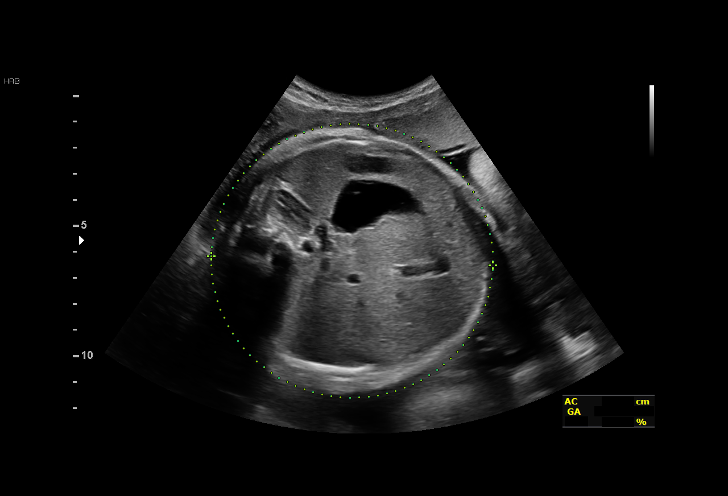
[im 12/35]
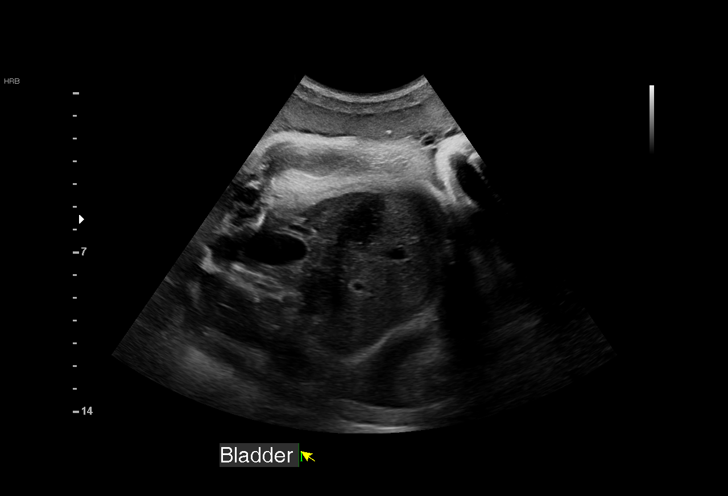
[im 14/35]
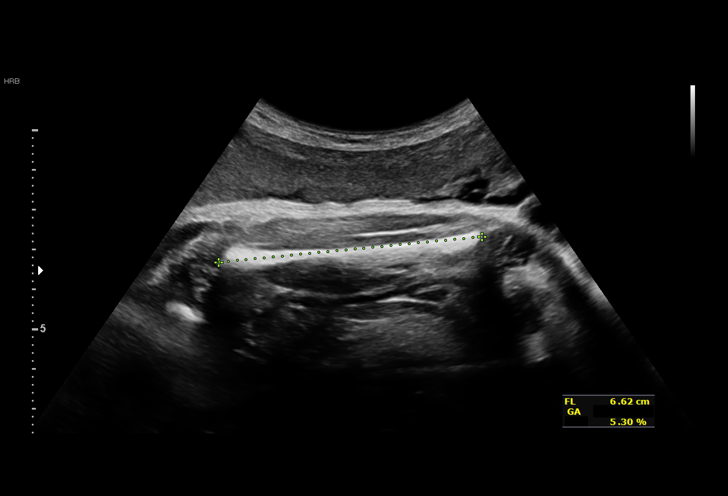
[im 17/35]
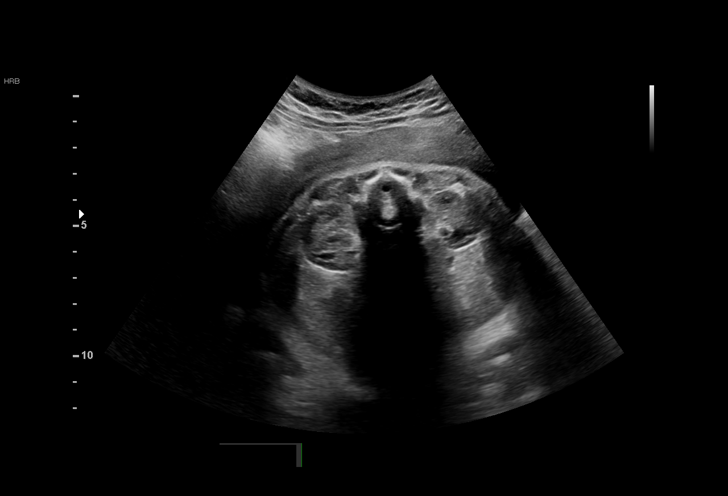
[im 19/35]
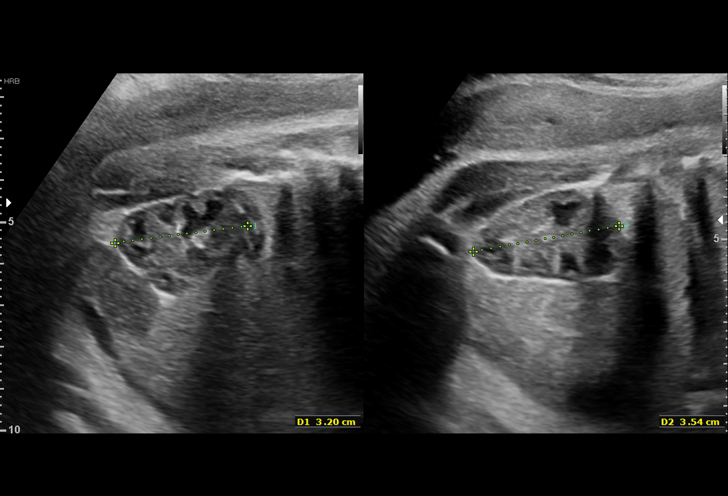
[im 22/35]
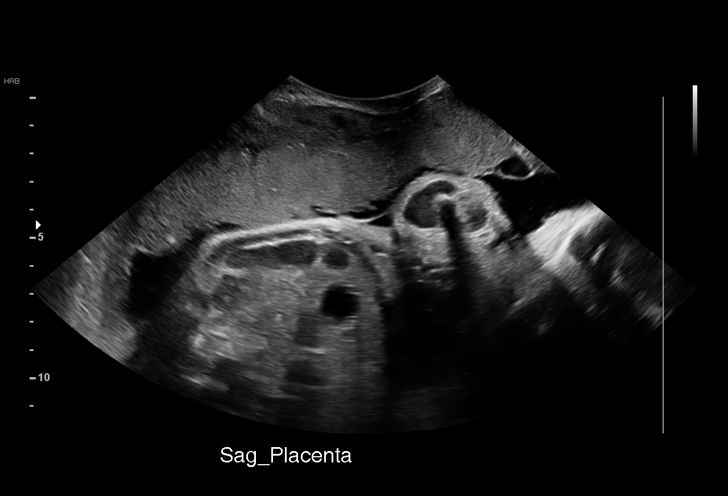
[im 24/35]
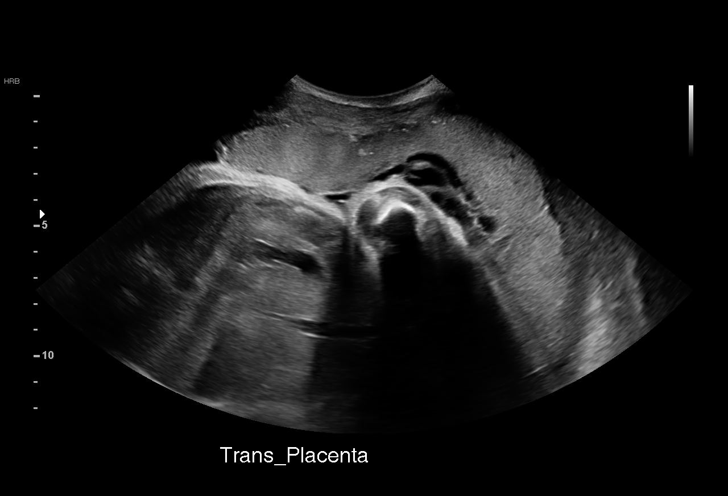
[im 27/35]
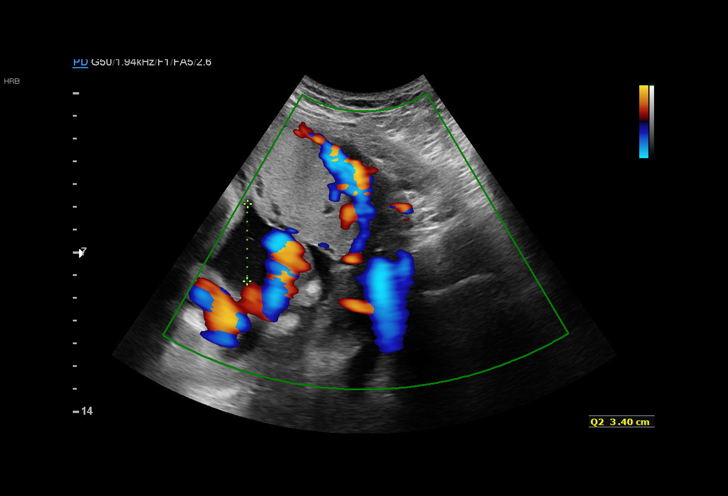
[im 29/35]
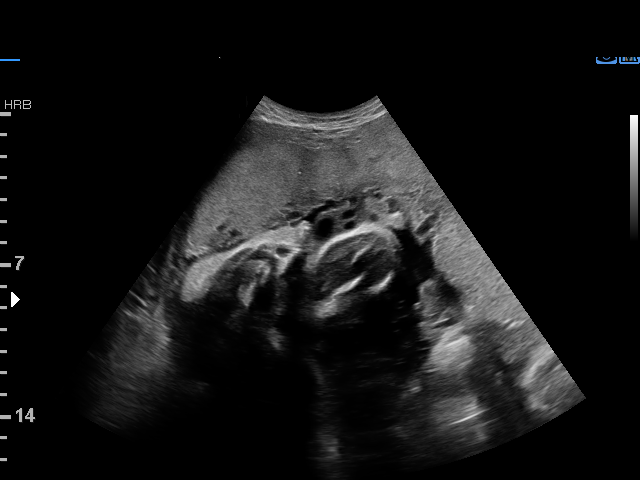
[im 32/35]
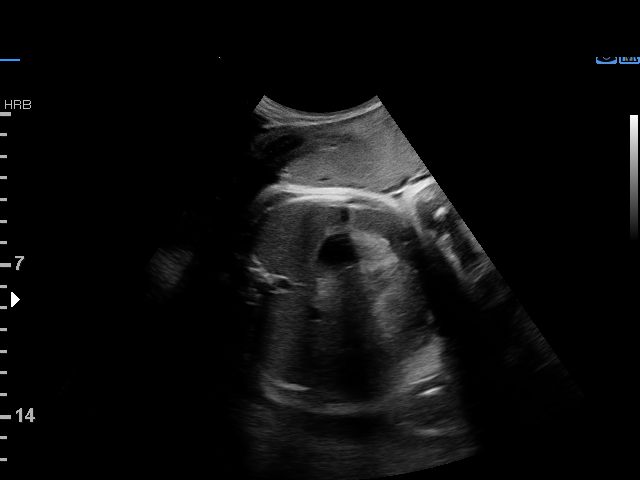
[im 35/35]
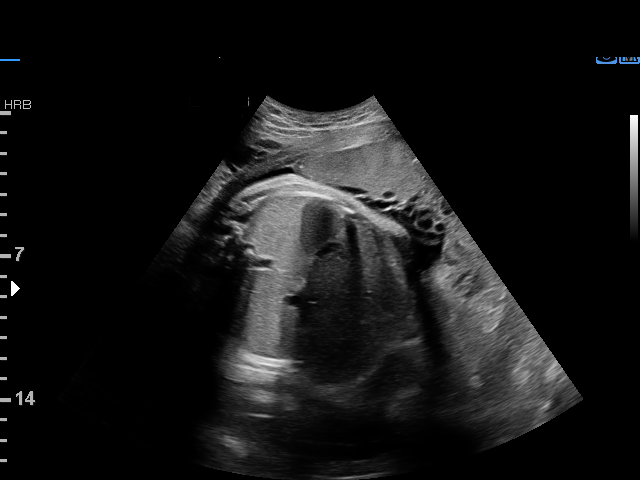

[14 of 28 positions shown; findings below may reference images not displayed]

[address]
                   [id number]

 ----------------------------------------------------------------------

 ----------------------------------------------------------------------
Indications

  Hypertension - Chronic/Pre-existing
  (Labetalol)(Low Risk NIPS)
  Poor obstetric history: Previous
  preeclampsia / eclampsia/gestational HTN
  Poor obstetric history: Previous gestational
  diabetes
  Genetic carrier (silent Vishant Ly)
  36 weeks gestation of pregnancy
 ----------------------------------------------------------------------
Vital Signs

 Weight (lb): 155                               Height:        5'
 BMI:
Fetal Evaluation

 Num Of Fetuses:         1
 Cardiac Activity:       Observed
 Presentation:           Cephalic
 Placenta:               Anterior
 P. Cord Insertion:      Previously Visualized

 Amniotic Fluid
 AFI FV:      Within normal limits

 AFI Sum(cm)     %Tile       Largest Pocket(cm)
 14.19           52
 RUQ(cm)       RLQ(cm)       LUQ(cm)        LLQ(cm)
 5.94          0
Biophysical Evaluation

 Amniotic F.V:   Within normal limits       F. Tone:        Observed
 F. Movement:    Observed                   Score:          [DATE]
 F. Breathing:   Observed
Biometry

 BPD:      85.3  mm     G. Age:  34w 3d         12  %    CI:        78.28   %    70 - 86
                                                         FL/HC:      21.7   %    20.1 -
 HC:       305   mm     G. Age:  34w 0d        < 1  %    HC/AC:      0.92        0.93 -
 AC:      332.3  mm     G. Age:  37w 1d         82  %    FL/BPD:     77.7   %    71 - 87
 FL:       66.3  mm     G. Age:  34w 1d          6  %    FL/AC:      20.0   %    20 - 24
 LV:        2.3  mm

 Est. FW:    2639  gm      6 lb 1 oz     37  %
OB History

 Gravidity:    2         Term:   1        Prem:   0        SAB:   0
 TOP:          0       Ectopic:  0        Living: 1
Gestational Age

 LMP:           36w 2d        Date:  11/16/18                 EDD:   08/23/19
 U/S Today:     35w 0d                                        EDD:   09/01/19
 Best:          36w 2d     Det. By:  LMP  (11/16/18)          EDD:   08/23/19
Anatomy

 Cranium:               Appears normal         Aortic Arch:            Previously seen
 Cavum:                 Appears normal         Ductal Arch:            Previously seen
 Ventricles:            Appears normal         Diaphragm:              Previously seen
 Choroid Plexus:        Previously seen        Stomach:                Appears normal, left
                                                                       sided
 Cerebellum:            Previously seen        Abdomen:                Previously seen
 Posterior Fossa:       Previously seen        Abdominal Wall:         Previously seen
 Nuchal Fold:           Not applicable (>20    Cord Vessels:           Previously seen
                        wks GA)
 Face:                  Orbits and profile     Kidneys:                Appear normal
                        previously seen
 Lips:                  Previously seen        Bladder:                Appears normal
 Thoracic:              Appears normal         Spine:                  Previously seen
 Heart:                 Previously seen        Upper Extremities:      Previously seen
 RVOT:                  Previously seen        Lower Extremities:      Previously seen
 LVOT:                  Previously seen

 Other:  Fetus appears to be a male. 5th digit visualized previously. Nasal
         bone visualized previously. Technically difficult due to fetal position.
Cervix Uterus Adnexa

 Cervix
 Not visualized (advanced GA >43wks)
Impression

 Normal interval growth.
 Low risk NIPS
 Chronic hypertension on meds
 Biophysical profile [DATE] with good fetal movement and
 amniotic fluid
Recommendations

 Continue weekly testing
 Repeat growth as clinically indicated
 Consider delivery between 37-39 weeks.

## 2021-03-09 ENCOUNTER — Ambulatory Visit: Payer: Self-pay | Admitting: Obstetrics and Gynecology

## 2021-03-10 ENCOUNTER — Emergency Department (HOSPITAL_COMMUNITY)
Admission: EM | Admit: 2021-03-10 | Discharge: 2021-03-10 | Disposition: A | Discharge status: Home / Self Care | Payer: Medicaid Other | Attending: Emergency Medicine | Admitting: Emergency Medicine

## 2021-03-10 ENCOUNTER — Other Ambulatory Visit: Payer: Self-pay

## 2021-03-10 ENCOUNTER — Encounter (HOSPITAL_COMMUNITY): Payer: Self-pay | Admitting: Emergency Medicine

## 2021-03-10 DIAGNOSIS — M79622 Pain in left upper arm: Secondary | ICD-10-CM | POA: Insufficient documentation

## 2021-03-10 DIAGNOSIS — Z8759 Personal history of other complications of pregnancy, childbirth and the puerperium: Secondary | ICD-10-CM | POA: Insufficient documentation

## 2021-03-10 DIAGNOSIS — M79602 Pain in left arm: Secondary | ICD-10-CM

## 2021-03-10 DIAGNOSIS — N898 Other specified noninflammatory disorders of vagina: Secondary | ICD-10-CM | POA: Insufficient documentation

## 2021-03-10 DIAGNOSIS — R102 Pelvic and perineal pain: Secondary | ICD-10-CM | POA: Diagnosis not present

## 2021-03-10 DIAGNOSIS — K6289 Other specified diseases of anus and rectum: Secondary | ICD-10-CM

## 2021-03-10 DIAGNOSIS — K644 Residual hemorrhoidal skin tags: Secondary | ICD-10-CM | POA: Diagnosis not present

## 2021-03-10 DIAGNOSIS — R1909 Other intra-abdominal and pelvic swelling, mass and lump: Secondary | ICD-10-CM | POA: Insufficient documentation

## 2021-03-10 DIAGNOSIS — Z79899 Other long term (current) drug therapy: Secondary | ICD-10-CM | POA: Insufficient documentation

## 2021-03-10 DIAGNOSIS — Z7984 Long term (current) use of oral hypoglycemic drugs: Secondary | ICD-10-CM | POA: Diagnosis not present

## 2021-03-10 DIAGNOSIS — Z8632 Personal history of gestational diabetes: Secondary | ICD-10-CM | POA: Insufficient documentation

## 2021-03-10 DIAGNOSIS — J45909 Unspecified asthma, uncomplicated: Secondary | ICD-10-CM | POA: Diagnosis not present

## 2021-03-10 DIAGNOSIS — R229 Localized swelling, mass and lump, unspecified: Secondary | ICD-10-CM

## 2021-03-10 LAB — WET PREP, GENITAL
Clue Cells Wet Prep HPF POC: NONE SEEN
Sperm: NONE SEEN
Trich, Wet Prep: NONE SEEN
Yeast Wet Prep HPF POC: NONE SEEN

## 2021-03-10 LAB — URINALYSIS, ROUTINE W REFLEX MICROSCOPIC
Bilirubin Urine: NEGATIVE
Glucose, UA: NEGATIVE mg/dL
Hgb urine dipstick: NEGATIVE
Ketones, ur: NEGATIVE mg/dL
Nitrite: NEGATIVE
Protein, ur: NEGATIVE mg/dL
Specific Gravity, Urine: 1.009 (ref 1.005–1.030)
pH: 7 (ref 5.0–8.0)

## 2021-03-10 LAB — CBC WITH DIFFERENTIAL/PLATELET
Abs Immature Granulocytes: 0.03 10*3/uL (ref 0.00–0.07)
Basophils Absolute: 0 10*3/uL (ref 0.0–0.1)
Basophils Relative: 0 %
Eosinophils Absolute: 0.2 10*3/uL (ref 0.0–0.5)
Eosinophils Relative: 3 %
HCT: 39.9 % (ref 36.0–46.0)
Hemoglobin: 12.7 g/dL (ref 12.0–15.0)
Immature Granulocytes: 0 %
Lymphocytes Relative: 24 %
Lymphs Abs: 1.8 10*3/uL (ref 0.7–4.0)
MCH: 28 pg (ref 26.0–34.0)
MCHC: 31.8 g/dL (ref 30.0–36.0)
MCV: 87.9 fL (ref 80.0–100.0)
Monocytes Absolute: 0.4 10*3/uL (ref 0.1–1.0)
Monocytes Relative: 6 %
Neutro Abs: 5 10*3/uL (ref 1.7–7.7)
Neutrophils Relative %: 67 %
Platelets: 277 10*3/uL (ref 150–400)
RBC: 4.54 MIL/uL (ref 3.87–5.11)
RDW: 15.7 % — ABNORMAL HIGH (ref 11.5–15.5)
WBC: 7.5 10*3/uL (ref 4.0–10.5)
nRBC: 0 % (ref 0.0–0.2)

## 2021-03-10 LAB — COMPREHENSIVE METABOLIC PANEL
ALT: 17 U/L (ref 0–44)
AST: 18 U/L (ref 15–41)
Albumin: 3.5 g/dL (ref 3.5–5.0)
Alkaline Phosphatase: 78 U/L (ref 38–126)
Anion gap: 3 — ABNORMAL LOW (ref 5–15)
BUN: 11 mg/dL (ref 6–20)
CO2: 29 mmol/L (ref 22–32)
Calcium: 8.7 mg/dL — ABNORMAL LOW (ref 8.9–10.3)
Chloride: 108 mmol/L (ref 98–111)
Creatinine, Ser: 0.71 mg/dL (ref 0.44–1.00)
GFR, Estimated: >60 mL/min (ref >60)
Glucose, Bld: 97 mg/dL (ref 70–99)
Potassium: 3.5 mmol/L (ref 3.5–5.1)
Sodium: 140 mmol/L (ref 135–145)
Total Bilirubin: 0.3 mg/dL (ref 0.3–1.2)
Total Protein: 6.3 g/dL — ABNORMAL LOW (ref 6.5–8.1)

## 2021-03-10 LAB — I-STAT BETA HCG BLOOD, ED (MC, WL, AP ONLY): I-stat hCG, quantitative: <5 m[IU]/mL (ref <5)

## 2021-03-10 NOTE — ED Provider Notes (Signed)
Emergency Medicine Provider Triage Evaluation Note  Marie Cabrera , a 25 y.o. female  was evaluated in triage.  Pt complains of right lower quadrant abdominal pain for the past few days.  She is concerned that she has an ovarian cyst.  Also reports pain associated with the Nexplanon in her arm that has gradually worsened over the past few months.  She has had this Nexplanon for over a year.  Tried to make an appoint with her OB/GYN but did not hear from them.  She was told to come to the ER if her symptoms worsen.  No discharge, abnormal bleeding or vomiting.  Review of Systems  Positive: Abdominal pain Negative: Vaginal discharge, abnormal bleeding, bowel  Physical Exam  There were no vitals taken for this visit. Gen:   Awake, no distress   Resp:  Normal effort  MSK:   Moves extremities without difficulty  Other:  Right lower quadrant tenderness  Medical Decision Making  Medically screening exam initiated at 3:42 PM.  Appropriate orders placed.  Marie Cabrera was informed that the remainder of the evaluation will be completed by another provider, this initial triage assessment does not replace that evaluation, and the importance of remaining in the ED until their evaluation is complete.  Lab work ordered   Dietrich Pates, PA-C 03/10/21 1543    Milagros Loll, MD 03/11/21 [phone]

## 2021-03-10 NOTE — Discharge Instructions (Signed)
Please read and follow all provided instructions.  Your diagnoses today include:  1. Skin nodule   2. Vaginal discharge   3. Rectal pain   4. Pain of left upper extremity     Tests performed today include: Wet prep - no sign of vaginal infection Blood cell counts (white, red, and platelets) Electrolytes  Kidney function test Urine test to check for infection Vital signs. See below for your results today.   Medications prescribed:  None  Take any prescribed medications only as directed.  Home care instructions:  Follow any educational materials contained in this packet.  BE VERY CAREFUL not to take multiple medicines containing Tylenol (also called acetaminophen). Doing so can lead to an overdose which can damage your liver and cause liver failure and possibly death.   Follow-up instructions: You will need follow-up with your primary care doctor and/or gynecology for further evaluation of the skin nodule and to potentially have your Nexplanon removed.  Return instructions:  Please return to the Emergency Department if you experience worsening symptoms.  Please return if you have any other emergent concerns.  Additional Information:  Your vital signs today were: BP (!) 134/91   Pulse 77   Temp 98.5 F (36.9 C)   Resp 18   Ht 5' (1.524 m)   Wt 61.7 kg   LMP 02/01/2021 (Approximate)   SpO2 100%   BMI 26.56 kg/m  If your blood pressure (BP) was elevated above 135/85 this visit, please have this repeated by your doctor within one month. --------------

## 2021-03-10 NOTE — ED Provider Notes (Signed)
[facility] DEPT Provider Note   CSN: [id number] Arrival date & time: 03/10/21  1523     History Chief Complaint  Patient presents with   Pelvic Pain   Arm Pain    Marie Cabrera is a 25 y.o. female.  Patient presents the emergency department today for multiple complaints.  Her main complaint is pain in a lump in the right pelvic area.  She has noted this for about a month.  No vaginal discharge or bleeding.  Area is tender to palpation.  It is affecting her when she moves around.  She states that she scheduled an appointment with [facility] and had an appointment yesterday, however the clinic was unexpectedly closed.  She presents to the emergency department today due to worsening symptoms.  Patient also reports ongoing left upper extremity pain and tenderness in the area of her Nexplanon.  She had this placed more than a year ago and over the past several months has had tenderness right over the Nexplanon without redness or swelling.  She was also going to request that she get this out by her OB/GYN, but again has been unable to be seen.  She also reports a tender lump around her rectum with some bleeding at times with wiping.  No history of hemorrhoids.      Past Medical History:  Diagnosis Date   ADHD (attention deficit hyperactivity disorder)    Amenorrhea    last menses 2007   Asthma    last ospitalized >4 years ago.  NO recent problesm   Heart murmur    [facility].  MD 2 d Echo [id number]   Rectal bleeding     Patient Active Problem List   Diagnosis Date Noted   Encounter for induction of labor 08/11/2019   Gestational diabetes 07/02/2019   Fall 05/21/2019   Chronic hypertension complicating or reason for care during pregnancy, second trimester 03/09/2019   Alpha+ thalassemia ([place]) 02/23/2019   Hx of preeclampsia, prior pregnancy, currently pregnant 02/09/2019   History of gestational diabetes 02/09/2019    Supervision of high risk pregnancy, antepartum 02/09/2019   Asthma 02/09/2019   Mild tetrahydrocannabinol (THC) abuse 11/19/2016   Alcohol-induced mood disorder ([place]) 01/08/2016   Substance abuse ([place])    Oppositional defiant disorder 05/22/2012    Past Surgical History:  Procedure Laterality Date   CLOSED REDUCTION FINGER WITH PERCUTANEOUS PINNING Left 02/24/2019   Procedure: CLOSED REDUCTION INTERNAL FIXATION MIDDLE PHALANX LEFT INDEX FINGER WITH PERCUTANEOUS PINNING;  Surgeon: Verner Mould, MD;  Location: [facility];  Service: Orthopedics;  Laterality: Left;   COLONOSCOPY  03/14/2012   Procedure: COLONOSCOPY;  Surgeon: Oletha Blend, MD;  Location: [facility];  Service: Gastroenterology;  Laterality: N/A;   I & D EXTREMITY Left 02/24/2019   Procedure: IRRIGATION AND DEBRIDEMENT LEFT INDEX FINGER;  Surgeon: Verner Mould, MD;  Location: [facility];  Service: Orthopedics;  Laterality: Left;   No surgical history       OB History     Gravida  2   Para  2   Term  2   Preterm  0   AB  0   Living  2      SAB  0   IAB  0   Ectopic  0   Multiple  0   Live Births  2           Family History  Adopted: Yes  Problem Relation Age  of Onset   Crohn's disease Maternal Aunt    Colon polyps Maternal Aunt    Colon cancer Maternal Grandfather    Alcohol abuse Mother    Depression Mother    Drug abuse Mother    Mental illness Mother    Alcohol abuse Father    Drug abuse Father    Mental illness Sister    Alcohol abuse Maternal Grandmother    Depression Maternal Grandmother    Diabetes Maternal Grandmother    Kidney disease Maternal Grandmother    Other Other        Pt is adopted, only knows about maternal family    Social History   Tobacco Use   Smoking status: Never   Smokeless tobacco: Never  Vaping Use   Vaping Use: Never used  Substance Use Topics   Alcohol use: No   Drug use: Not Currently    Types: Marijuana    Comment: last use June 2018     Home Medications Prior to Admission medications   Medication Sig Start Date End Date Taking? Authorizing Provider  acetaminophen (TYLENOL) 325 MG tablet Take 2 tablets (650 mg total) by mouth every 6 (six) hours as needed (for pain scale < 4). Patient not taking: Reported on 09/09/2019 08/13/19   Chancy Milroy, MD  amLODipine (NORVASC) 5 MG tablet Take 1 tablet (5 mg total) by mouth daily. 08/13/19   FairMarin Shutter, MD  Blood Pressure Monitoring KIT 1 kit by Does not apply route once a week. Patient not taking: Reported on 09/09/2019 03/09/19   Fatima Blank A, CNM  ibuprofen (ADVIL) 600 MG tablet Take 1 tablet (600 mg total) by mouth every 8 (eight) hours as needed for mild pain. Patient not taking: Reported on 09/09/2019 08/13/19   Chancy Milroy, MD  Prenatal Vit-Fe Phos-FA-Omega (VITAFOL GUMMIES) 3.33-0.333-34.8 MG CHEW Chew 1 tablet by mouth daily. Patient not taking: Reported on 09/09/2019 02/09/19   Sloan Leiter, MD    Allergies    Patient has no known allergies.  Review of Systems   Review of Systems  Constitutional:  Negative for fever.  HENT:  Negative for rhinorrhea and sore throat.   Eyes:  Negative for redness.  Respiratory:  Negative for cough.   Cardiovascular:  Negative for chest pain.  Gastrointestinal:  Positive for rectal pain. Negative for abdominal pain, diarrhea, nausea and vomiting.  Genitourinary:  Positive for pelvic pain. Negative for dysuria, frequency, hematuria, urgency, vaginal bleeding and vaginal discharge.  Musculoskeletal:  Positive for myalgias.  Skin:  Negative for rash.  Neurological:  Negative for headaches.   Physical Exam Updated Vital Signs BP 134/88 (BP Location: Right Arm)   Pulse (!) 56   Temp 98.5 F (36.9 C)   Resp 18   Ht 5' (1.524 m)   Wt 61.7 kg   LMP 02/01/2021 (Approximate)   SpO2 100%   BMI 26.56 kg/m   Physical Exam Vitals and nursing note reviewed. Exam conducted with a chaperone present.   Constitutional:      General: She is not in acute distress.    Appearance: She is well-developed.  HENT:     Head: Normocephalic and atraumatic.     Right Ear: External ear normal.     Left Ear: External ear normal.     Nose: Nose normal.  Eyes:     Conjunctiva/sclera: Conjunctivae normal.  Cardiovascular:     Rate and Rhythm: Normal rate and regular rhythm.     Heart  sounds: No murmur heard. Pulmonary:     Effort: No respiratory distress.     Breath sounds: No wheezing, rhonchi or rales.  Abdominal:     Palpations: Abdomen is soft.     Tenderness: There is no abdominal tenderness. There is no guarding or rebound.  Genitourinary:    Exam position: Lithotomy position.     Labia:        Right: No rash.        Left: No rash.      Vagina: Vaginal discharge (heterogenous, white) present.     Cervix: No discharge or cervical bleeding.     Uterus: Not tender.      Adnexa:        Right: No mass, tenderness or fullness.         Left: No mass, tenderness or fullness.       Rectum: External hemorrhoid (small, non-thrombosed) present.     Comments: Firm nodule R inguinal region, tender, no overlying erythema or fluctuance. Question epidermoid cyst or lymph node.   There is a dime-sized area of skin breakdown right perianal area.  Musculoskeletal:     Cervical back: Normal range of motion and neck supple.     Right lower leg: No edema.     Left lower leg: No edema.     Comments: Nexplanon in place. Palpable tenderness in this area without erythema, fluctuation, induration.   Skin:    General: Skin is warm and dry.     Findings: No rash.  Neurological:     General: No focal deficit present.     Mental Status: She is alert. Mental status is at baseline.     Motor: No weakness.  Psychiatric:        Mood and Affect: Mood normal.    ED Results / Procedures / Treatments   Labs (all labs ordered are listed, but only abnormal results are displayed) Labs Reviewed  COMPREHENSIVE  METABOLIC PANEL - Abnormal; Notable for the following components:      Result Value   Calcium 8.7 (*)    Total Protein 6.3 (*)    Anion gap 3 (*)    All other components within normal limits  CBC WITH DIFFERENTIAL/PLATELET - Abnormal; Notable for the following components:   RDW 15.7 (*)    All other components within normal limits  WET PREP, GENITAL  URINALYSIS, ROUTINE W REFLEX MICROSCOPIC  I-STAT BETA HCG BLOOD, ED ([facility], [facility], [facility] ONLY)  GC/CHLAMYDIA PROBE AMP ([place]) NOT AT [facility]    EKG None  Radiology No results found.  Procedures Procedures   Medications Ordered in ED Medications - No data to display  ED Course  I have reviewed the triage vital signs and the nursing notes.  Pertinent labs & imaging results that were available during my care of the patient were reviewed by me and considered in my medical decision making (see chart for details).  Patient seen and examined. Work-up initiated. Agrees to proceed with pelvic exam.   Vital signs reviewed and are as follows: BP (!) 134/91 (BP Location: Right Arm)   Pulse 77   Temp 98.5 F (36.9 C) (Oral)   Resp 18   Ht 5' (1.524 m)   Wt 61.7 kg   LMP 02/01/2021 (Approximate)   SpO2 100%   BMI 26.56 kg/m   Pelvic exam performed.   She will need GYN follow-up for the nodule in her pelvis -- if not improving may need outpatient consideration for  biopsy.   For the erosion near the rectum, recommended barrier ointment and allowing area to heal.   She will also need GYN f/u for consideration of nexplanon removal.   Wet prep neg, no indication for further treatment.   Per d/c papers, she has GYN appt upcoming next week. Also given [facility] f/u per request.   Patient urged to return with worsening symptoms or other concerns. Patient verbalized understanding and agrees with plan.      MDM Rules/Calculators/A&P                          Discussion as above.    Final Clinical Impression(s) / ED  Diagnoses Final diagnoses:  Skin nodule  Vaginal discharge  Rectal pain  Pain of left upper extremity    Rx / DC Orders ED Discharge Orders     None        Carlisle Cater, PA-C 03/11/21 1415    Long, Wonda Olds, MD 03/11/21 1455

## 2021-03-10 NOTE — ED Triage Notes (Signed)
Pt states her implanon in her L arm is very sore. States it has been in x 2 years. Also states she has had nausea and R lower pelvic pain x a week or more. Alert and oriented.

## 2021-03-10 NOTE — ED Notes (Signed)
MD and tec hat bedside for pelvic exam

## 2021-03-14 LAB — GC/CHLAMYDIA PROBE AMP (~~LOC~~) NOT AT ARMC
Chlamydia: POSITIVE — AB
Comment: NEGATIVE
Comment: NORMAL
Neisseria Gonorrhea: NEGATIVE

## 2021-03-15 ENCOUNTER — Other Ambulatory Visit: Payer: Self-pay

## 2021-03-15 ENCOUNTER — Encounter: Payer: Self-pay | Admitting: Obstetrics

## 2021-03-15 ENCOUNTER — Ambulatory Visit (INDEPENDENT_AMBULATORY_CARE_PROVIDER_SITE_OTHER): Payer: Medicaid Other | Admitting: Obstetrics

## 2021-03-15 VITALS — BP 144/80 | HR 80 | Wt 139.1 lb

## 2021-03-15 DIAGNOSIS — N764 Abscess of vulva: Secondary | ICD-10-CM | POA: Diagnosis not present

## 2021-03-15 DIAGNOSIS — A749 Chlamydial infection, unspecified: Secondary | ICD-10-CM | POA: Diagnosis not present

## 2021-03-15 DIAGNOSIS — Z3046 Encounter for surveillance of implantable subdermal contraceptive: Secondary | ICD-10-CM

## 2021-03-15 MED ORDER — DOXYCYCLINE HYCLATE 100 MG PO CAPS: 100.0000 mg | ORAL_CAPSULE | Freq: Two times a day (BID) | ORAL | 0 refills | Status: DC

## 2021-03-15 MED ORDER — AMOXICILLIN-POT CLAVULANATE 875-125 MG PO TABS: 1.0000 | ORAL_TABLET | Freq: Two times a day (BID) | ORAL | 0 refills | Status: AC

## 2021-03-15 NOTE — Progress Notes (Signed)
Patient ID: Marie Cabrera, female   DOB: January 02, 1996, 25 y.o.   MRN: [id number]  Chief Complaint  Patient presents with   Contraception    HPI Marie Cabrera is a 25 y.o. female.  Complains of painful " bump " in vulva - right groin junction.  Also has severe pain at Nexplanon rod insertion site.  Requests removal of Nexplanon because of the pain. HPI  Past Medical History:  Diagnosis Date   ADHD (attention deficit hyperactivity disorder)    Amenorrhea    last menses 2007   Asthma    last ospitalized >4 years ago.  NO recent problesm   Heart murmur    [facility].  MD 2 d Echo [id number]   Rectal bleeding     Past Surgical History:  Procedure Laterality Date   CLOSED REDUCTION FINGER WITH PERCUTANEOUS PINNING Left 02/24/2019   Procedure: CLOSED REDUCTION INTERNAL FIXATION MIDDLE PHALANX LEFT INDEX FINGER WITH PERCUTANEOUS PINNING;  Surgeon: Ernest Mallick, MD;  Location: [facility] OR;  Service: Orthopedics;  Laterality: Left;   COLONOSCOPY  03/14/2012   Procedure: COLONOSCOPY;  Surgeon: Jon Gills, MD;  Location: [facility] OR;  Service: Gastroenterology;  Laterality: N/A;   I & D EXTREMITY Left 02/24/2019   Procedure: IRRIGATION AND DEBRIDEMENT LEFT INDEX FINGER;  Surgeon: Ernest Mallick, MD;  Location: [facility] OR;  Service: Orthopedics;  Laterality: Left;   No surgical history      Family History  Adopted: Yes  Problem Relation Age of Onset   Crohn's disease Maternal Aunt    Colon polyps Maternal Aunt    Colon cancer Maternal Grandfather    Alcohol abuse Mother    Depression Mother    Drug abuse Mother    Mental illness Mother    Alcohol abuse Father    Drug abuse Father    Mental illness Sister    Alcohol abuse Maternal Grandmother    Depression Maternal Grandmother    Diabetes Maternal Grandmother    Kidney disease Maternal Grandmother    Other Other        Pt is adopted, only knows about maternal family    Social History Social History   Tobacco Use    Smoking status: Never   Smokeless tobacco: Never  Vaping Use   Vaping Use: Never used  Substance Use Topics   Alcohol use: No   Drug use: Not Currently    Types: Marijuana    Comment: last use June 2018    No Known Allergies  Current Outpatient Medications  Medication Sig Dispense Refill   amoxicillin-clavulanate (AUGMENTIN) 875-125 MG tablet Take 1 tablet by mouth 2 (two) times daily. 14 tablet 0   doxycycline (VIBRAMYCIN) 100 MG capsule Take 1 capsule (100 mg total) by mouth 2 (two) times daily. 14 capsule 0   amLODipine (NORVASC) 5 MG tablet Take 1 tablet (5 mg total) by mouth daily. (Patient not taking: Reported on 03/15/2021) 90 tablet 0   No current facility-administered medications for this visit.    Review of Systems Review of Systems Constitutional: negative for fatigue and weight loss Respiratory: negative for cough and wheezing Cardiovascular: negative for chest pain, fatigue and palpitations Gastrointestinal: negative for abdominal pain and change in bowel habits Genitourinary:positive for bump on right vulva-near groin Integument/breast: positive for severe pain at Nexplanon site Musculoskeletal:negative for myalgias Neurological: negative for gait problems and tremors Behavioral/Psych: negative for abusive relationship, depression Endocrine: negative for temperature intolerance  Blood pressure (!) 144/80, pulse 80, weight 139 lb 1.6 oz (63.1 kg), not currently breastfeeding.  Physical Exam Physical Exam General:   Alert and no distress  Skin:   no rash or abnormalities  Lungs:   clear to auscultation bilaterally  Heart:   regular rate and rhythm, S1, S2 normal, no murmur, click, rub or gallop  Breasts:   Not examined  Abdomen:  normal findings: no organomegaly, soft, non-tender and no hernia  Pelvis:  External genitalia: firm and tender mass in right vulva , subcutaneous       I have spent a total of 30 minutes of face-to-face time, excluding  clinical staff time, reviewing notes and preparing to see patient, ordering tests and/or medications, and counseling the patient.   Data Reviewed Labs  Assessment     1. Carbuncle of vulva Rx: - amoxicillin-clavulanate (AUGMENTIN) 875-125 MG tablet; Take 1 tablet by mouth 2 (two) times daily.  Dispense: 14 tablet; Refill: 0  2. Encounter for Nexplanon removal  3. Chlamydia infection Rx: - doxycycline (VIBRAMYCIN) 100 MG capsule; Take 1 capsule (100 mg total) by mouth 2 (two) times daily.  Dispense: 14 capsule; Refill: 0    Plan   Follow up in 2 weeks   Meds ordered this encounter  Medications   doxycycline (VIBRAMYCIN) 100 MG capsule    Sig: Take 1 capsule (100 mg total) by mouth 2 (two) times daily.    Dispense:  14 capsule    Refill:  0   amoxicillin-clavulanate (AUGMENTIN) 875-125 MG tablet    Sig: Take 1 tablet by mouth 2 (two) times daily.    Dispense:  14 tablet    Refill:  0     Brock Bad, MD 03/15/2021 4:58 PM    NEXPLANON REMOVAL NOTE  Date of LMP:   unknown  Contraception used: *Nexplanon   Indications:  The patient desires removal of Nexplanon because of severe pain at insertion site.  She understands risks, benefits, and alternatives to Nexplanon and would like to proceed.  Anesthesia:   Lidocaine 1% plain.  Procedure:  A time-out was performed confirming the procedure and the patient's allergy status.  Complications: None                      The rod was palpated and the area was sterilely prepped.  The area beneath the distal tip was anesthetized with 1% xylocaine and the skin incised                       Over the tip and the tip was exposed, grasped with forcep and removed intact.   Steri strip                       And a bandage applied and the arm was wrapped with gauze bandage.  The patient tolerated well.  Instructions:  The patient was instructed to remove the dressing in 24 hours and that some bruising is to be expected.  She was advised  to use over the counter analgesics as needed for any pain at the site.  She is to keep the area dry for 24 hours and to call if her hand or arm becomes cold, numb, or blue.  Return visit:  Return in 2 weeks   Brock Bad, MD 03/15/2021 4:58 PM

## 2021-03-15 NOTE — Progress Notes (Signed)
Pt c/o L arm pain and she is associating the pain with her Nexplanon.  Nexplanon inserted 08/2019 postpartum  She requests Nexplanon removal and does not desire BC at this time.  Pt also c/o lump in R pelvic area makes it difficult to walk. GC positive 03/10/21 at the hosptial. Needs GC treatment.

## 2021-03-29 ENCOUNTER — Ambulatory Visit: Payer: Medicaid Other | Admitting: Obstetrics

## 2022-08-21 ENCOUNTER — Ambulatory Visit: Payer: Medicaid Other | Admitting: Obstetrics

## 2022-08-23 ENCOUNTER — Ambulatory Visit (INDEPENDENT_AMBULATORY_CARE_PROVIDER_SITE_OTHER): Payer: Medicaid Other | Admitting: Obstetrics

## 2022-08-23 ENCOUNTER — Other Ambulatory Visit (HOSPITAL_COMMUNITY)
Admission: RE | Admit: 2022-08-23 | Discharge: 2022-08-23 | Disposition: A | Discharge status: Home / Self Care | Payer: Medicaid Other | Source: Ambulatory Visit | Attending: Obstetrics | Admitting: Obstetrics

## 2022-08-23 ENCOUNTER — Encounter: Payer: Self-pay | Admitting: Obstetrics

## 2022-08-23 VITALS — BP 142/87 | HR 76 | Ht 60.0 in | Wt 132.3 lb

## 2022-08-23 DIAGNOSIS — Z113 Encounter for screening for infections with a predominantly sexual mode of transmission: Secondary | ICD-10-CM | POA: Diagnosis not present

## 2022-08-23 DIAGNOSIS — A599 Trichomoniasis, unspecified: Secondary | ICD-10-CM

## 2022-08-23 DIAGNOSIS — Z01419 Encounter for gynecological examination (general) (routine) without abnormal findings: Secondary | ICD-10-CM | POA: Diagnosis present

## 2022-08-23 DIAGNOSIS — N898 Other specified noninflammatory disorders of vagina: Secondary | ICD-10-CM | POA: Diagnosis not present

## 2022-08-23 DIAGNOSIS — N730 Acute parametritis and pelvic cellulitis: Secondary | ICD-10-CM

## 2022-08-23 DIAGNOSIS — A749 Chlamydial infection, unspecified: Secondary | ICD-10-CM

## 2022-08-23 DIAGNOSIS — N760 Acute vaginitis: Secondary | ICD-10-CM

## 2022-08-23 DIAGNOSIS — B9689 Other specified bacterial agents as the cause of diseases classified elsewhere: Secondary | ICD-10-CM

## 2022-08-23 LAB — POCT URINE PREGNANCY: Preg Test, Ur: NEGATIVE

## 2022-08-23 NOTE — Progress Notes (Signed)
Patient presents for stomach cramps with no period. She ha some spotting all day today. No bleeding in November. Complains of vaginal irritation, increased discharge, no odor.   Last pap 11/12/16 Normal

## 2022-08-23 NOTE — Progress Notes (Signed)
Subjective:        Marie Cabrera is a 26 y.o. female here for a routine exam.  Current complaints: Vaginal spotting and cramping.  No period in November..    Personal health questionnaire:  Is patient Ashkenazi Jewish, have a family history of breast and/or ovarian cancer: no Is there a family history of uterine cancer diagnosed at age < 35, gastrointestinal cancer, urinary tract cancer, family member who is a Personnel officer syndrome-associated carrier: yes Is the patient overweight and hypertensive, family history of diabetes, personal history of gestational diabetes, preeclampsia or PCOS: no Is patient over 99, have PCOS,  family history of premature CHD under age 49, diabetes, smoke, have hypertension or peripheral artery disease:  no At any time, has a partner hit, kicked or otherwise hurt or frightened you?: no Over the past 2 weeks, have you felt down, depressed or hopeless?: no Over the past 2 weeks, have you felt little interest or pleasure in doing things?:no   Gynecologic History No LMP recorded. Contraception: none Last Pap: 2018. Results were: normal Last mammogram: n/a. Results were: n/a  Obstetric History OB History  Gravida Para Term Preterm AB Living  2 2 2  0 0 2  SAB IAB Ectopic Multiple Live Births  0 0 0 0 2    # Outcome Date GA Lbr Len/2nd Weight Sex Delivery Anes PTL Lv  2 Term 08/12/19 [redacted]w[redacted]d 10:50 / 00:06 6 lb 4.5 oz (2.85 kg) M Vag-Spont EPI  LIV  1 Term 06/01/17 [redacted]w[redacted]d 14:12 / 00:32 6 lb 1.9 oz (2.775 kg) M Vag-Spont EPI  LIV    Past Medical History:  Diagnosis Date   ADHD (attention deficit hyperactivity disorder)    Amenorrhea    last menses 2007   Asthma    last ospitalized >4 years ago.  NO recent problesm   Heart murmur    [facility].  MD 2 d Echo [id number]   Rectal bleeding     Past Surgical History:  Procedure Laterality Date   CLOSED REDUCTION FINGER WITH PERCUTANEOUS PINNING Left 02/24/2019   Procedure: CLOSED REDUCTION INTERNAL  FIXATION MIDDLE PHALANX LEFT INDEX FINGER WITH PERCUTANEOUS PINNING;  Surgeon: 02/26/2019, MD;  Location: [facility] OR;  Service: Orthopedics;  Laterality: Left;   COLONOSCOPY  03/14/2012   Procedure: COLONOSCOPY;  Surgeon: 05/15/2012, MD;  Location: [facility] OR;  Service: Gastroenterology;  Laterality: N/A;   I & D EXTREMITY Left 02/24/2019   Procedure: IRRIGATION AND DEBRIDEMENT LEFT INDEX FINGER;  Surgeon: 02/26/2019, MD;  Location: [facility] OR;  Service: Orthopedics;  Laterality: Left;   No surgical history       Current Outpatient Medications:    amLODipine (NORVASC) 5 MG tablet, Take 1 tablet (5 mg total) by mouth daily., Disp: 90 tablet, Rfl: 0   amoxicillin-clavulanate (AUGMENTIN) 875-125 MG tablet, Take 1 tablet by mouth 2 (two) times daily., Disp: 14 tablet, Rfl: 0   doxycycline (VIBRAMYCIN) 100 MG capsule, Take 1 capsule (100 mg total) by mouth 2 (two) times daily., Disp: 28 capsule, Rfl: 0   metroNIDAZOLE (FLAGYL) 500 MG tablet, Take 1 tablet (500 mg total) by mouth 2 (two) times daily., Disp: 28 tablet, Rfl: 0  Current Facility-Administered Medications:    cefTRIAXone (ROCEPHIN) injection 500 mg, 500 mg, Intramuscular, Once, Ernest Mallick, MD No Known Allergies  Social History   Tobacco Use   Smoking status: Never   Smokeless tobacco: Never  Substance Use Topics  Alcohol use: Yes    Comment: occ    Family History  Adopted: Yes  Problem Relation Age of Onset   Alcohol abuse Mother    Depression Mother    Drug abuse Mother    Mental illness Mother    Alcohol abuse Father    Drug abuse Father    Mental illness Sister    Alcohol abuse Maternal Grandmother    Depression Maternal Grandmother    Diabetes Maternal Grandmother    Kidney disease Maternal Grandmother    Colon cancer Maternal Grandfather    Crohn's disease Maternal Aunt    Colon polyps Maternal Aunt    Other Other        Pt is adopted, only knows about maternal family      Review of  Systems  Constitutional: negative for fatigue and weight loss Respiratory: negative for cough and wheezing Cardiovascular: negative for chest pain, fatigue and palpitations Gastrointestinal: negative for abdominal pain and change in bowel habits Musculoskeletal:negative for myalgias Neurological: negative for gait problems and tremors Behavioral/Psych: negative for abusive relationship, depression Endocrine: negative for temperature intolerance    Genitourinary: positive for vaginal discharge, AUB, and pelvic pain.  negative for abnormal menstrual periods, genital lesions, hot flashes, sexual problems  Integument/breast: negative for breast lump, breast tenderness, nipple discharge and skin lesion(s)    Objective:       BP (!) 142/87   Pulse 76   Ht 5' (1.524 m)   Wt 132 lb 4.8 oz (60 kg)   BMI 25.84 kg/m  General:   Alert and no distress  Skin:   no rash or abnormalities  Lungs:   clear to auscultation bilaterally  Heart:   regular rate and rhythm, S1, S2 normal, no murmur, click, rub or gallop  Breasts:   normal without suspicious masses, skin or nipple changes or axillary nodes  Abdomen:  normal findings: no organomegaly, soft, non-tender and no hernia  Pelvis:  External genitalia: normal general appearance Urinary system: urethral meatus normal and bladder without fullness, nontender Vaginal: normal without tenderness, induration or masses Cervix: normal appearance Adnexa: normal bimanual exam Uterus: anteverted and non-tender, normal size   Lab Review Urine pregnancy test Labs reviewed yes Radiologic studies reviewed no  I have spent a total of 20 minutes of face-to-face time, excluding clinical staff time, reviewing notes and preparing to see patient, ordering tests and/or medications, and counseling the patient.   Assessment:    1. Encounter for gynecological examination with Papanicolaou smear of cervix Rx: - POCT urine pregnancy - Cytology - PAP( [facility]) - RPR, quant & T.pallidum antibodies  2. Vaginal discharge Rx: - Cervicovaginal ancillary only( [place])  3. Screen for STD (sexually transmitted disease) Rx: - HIV Antibody (routine testing w rflx) - RPR - Hepatitis C antibody - Hepatitis B surface antigen  4. Chlamydia infection Rx: - doxycycline (VIBRAMYCIN) 100 MG capsule; Take 1 capsule (100 mg total) by mouth 2 (two) times daily.  Dispense: 28 capsule; Refill: 0  5. Trichomonas infection Rx: - metroNIDAZOLE (FLAGYL) 500 MG tablet; Take 1 tablet (500 mg total) by mouth 2 (two) times daily.  Dispense: 28 tablet; Refill: 0  6. BV (bacterial vaginosis) Rx: - metroNIDAZOLE (FLAGYL) 500 MG tablet; Take 1 tablet (500 mg total) by mouth 2 (two) times daily.  Dispense: 28 tablet; Refill: 0  7. PID (acute pelvic inflammatory disease) Rx: - metroNIDAZOLE (FLAGYL) 500 MG tablet; Take 1 tablet (500 mg total) by mouth 2 (two)  times daily.  Dispense: 28 tablet; Refill: 0 - doxycycline (VIBRAMYCIN) 100 MG capsule; Take 1 capsule (100 mg total) by mouth 2 (two) times daily.  Dispense: 28 capsule; Refill: 0     Plan:    Education reviewed: calcium supplements, depression evaluation, low fat, low cholesterol diet, safe sex/STD prevention, self breast exams, and weight bearing exercise. Contraception: none. Follow up in: 2 weeks.   Meds ordered this encounter  Medications   metroNIDAZOLE (FLAGYL) 500 MG tablet    Sig: Take 1 tablet (500 mg total) by mouth 2 (two) times daily.    Dispense:  28 tablet    Refill:  0   doxycycline (VIBRAMYCIN) 100 MG capsule    Sig: Take 1 capsule (100 mg total) by mouth 2 (two) times daily.    Dispense:  28 capsule    Refill:  0   Orders Placed This Encounter  Procedures   HIV Antibody (routine testing w rflx)   RPR   Hepatitis C antibody   Hepatitis B surface antigen   RPR, quant & T.pallidum antibodies   POCT urine pregnancy    Brock Bad, MD 08/27/2022 9:11 PM

## 2022-08-24 LAB — CERVICOVAGINAL ANCILLARY ONLY
Bacterial Vaginitis (gardnerella): POSITIVE — AB
Candida Glabrata: NEGATIVE
Candida Vaginitis: NEGATIVE
Chlamydia: POSITIVE — AB
Comment: NEGATIVE
Comment: NEGATIVE
Comment: NEGATIVE
Comment: NEGATIVE
Comment: NEGATIVE
Comment: NORMAL
Neisseria Gonorrhea: POSITIVE — AB
Trichomonas: POSITIVE — AB

## 2022-08-25 LAB — HEPATITIS B SURFACE ANTIGEN: Hepatitis B Surface Ag: NEGATIVE

## 2022-08-25 LAB — RPR, QUANT+TP ABS (REFLEX)
Rapid Plasma Reagin, Quant: 1:1 {titer} — ABNORMAL HIGH
T Pallidum Abs: REACTIVE — AB

## 2022-08-25 LAB — RPR: RPR Ser Ql: REACTIVE — AB

## 2022-08-25 LAB — HIV ANTIBODY (ROUTINE TESTING W REFLEX): HIV Screen 4th Generation wRfx: NONREACTIVE

## 2022-08-25 LAB — HEPATITIS C ANTIBODY: Hep C Virus Ab: NONREACTIVE

## 2022-08-27 ENCOUNTER — Other Ambulatory Visit: Payer: Self-pay | Admitting: Emergency Medicine

## 2022-08-27 ENCOUNTER — Other Ambulatory Visit: Payer: Self-pay | Admitting: Obstetrics

## 2022-08-27 DIAGNOSIS — A749 Chlamydial infection, unspecified: Secondary | ICD-10-CM

## 2022-08-27 DIAGNOSIS — A549 Gonococcal infection, unspecified: Secondary | ICD-10-CM

## 2022-08-27 DIAGNOSIS — B9689 Other specified bacterial agents as the cause of diseases classified elsewhere: Secondary | ICD-10-CM

## 2022-08-27 DIAGNOSIS — A530 Latent syphilis, unspecified as early or late: Secondary | ICD-10-CM

## 2022-08-27 DIAGNOSIS — A539 Syphilis, unspecified: Secondary | ICD-10-CM

## 2022-08-27 DIAGNOSIS — A599 Trichomoniasis, unspecified: Secondary | ICD-10-CM

## 2022-08-27 MED ORDER — DOXYCYCLINE HYCLATE 100 MG PO CAPS: 100.0000 mg | ORAL_CAPSULE | Freq: Two times a day (BID) | ORAL | 0 refills | Status: DC

## 2022-08-27 MED ORDER — CEFTRIAXONE SODIUM 500 MG IJ SOLR: 500.0000 mg | Freq: Once | INTRAMUSCULAR | Status: AC

## 2022-08-27 MED ORDER — METRONIDAZOLE 500 MG PO TABS: 500.0000 mg | ORAL_TABLET | Freq: Two times a day (BID) | ORAL | 0 refills | Status: DC

## 2022-08-28 ENCOUNTER — Telehealth: Payer: Self-pay | Admitting: Emergency Medicine

## 2022-08-28 NOTE — Telephone Encounter (Signed)
Attempted call to pt. Female answered phone and stated wrong number. Attempted to call mother's phone number, unable to reach.

## 2022-08-29 ENCOUNTER — Encounter: Payer: Self-pay | Admitting: Emergency Medicine

## 2022-08-30 ENCOUNTER — Ambulatory Visit (INDEPENDENT_AMBULATORY_CARE_PROVIDER_SITE_OTHER): Payer: Medicaid Other | Admitting: *Deleted

## 2022-08-30 VITALS — BP 157/104 | HR 87

## 2022-08-30 DIAGNOSIS — A549 Gonococcal infection, unspecified: Secondary | ICD-10-CM

## 2022-08-30 MED ORDER — CEFTRIAXONE SODIUM 500 MG IJ SOLR
500.0000 mg | Freq: Once | INTRAMUSCULAR | Status: AC
  Administered 2022-08-30: 500 mg via INTRAMUSCULAR

## 2022-08-30 NOTE — Progress Notes (Signed)
Marie Cabrera presents for Rocephin 500mg  IM for TX of gonorrhea. NKDA. Previously advised of concurrent infections of chlamydia, trichomonas, and syphilis. Pt has not picked up RX for doxycycline or flagyl. Advised to do so today. Partner was informed and advised to seek treatment. Pt advised to present to [facility] for TX of syphilis today. At risk behavior and safe sex discussed. Pt verbalized understanding.  Pt's BP is elevated today. No acute distress noted. Normal mentation. Has not been taking amlodipine. Advised follow up with PCP. Pt does not have PCP. List of PCPs sent via MyChart per pt request. Pt advised of risks of untreated HTN.

## 2022-09-05 LAB — CYTOLOGY - PAP: Diagnosis: NEGATIVE

## 2022-09-19 ENCOUNTER — Ambulatory Visit: Payer: Medicaid Other | Admitting: Internal Medicine

## 2022-09-19 ENCOUNTER — Telehealth: Payer: Self-pay

## 2022-09-19 NOTE — Progress Notes (Deleted)
Patient: Marie Cabrera  DOB: 09-Apr-1996 MRN: [id number] PCP: Pcp, No  Referring Provider: ***  No chief complaint on file.    Patient Active Problem List   Diagnosis Date Noted   Encounter for induction of labor 08/11/2019   Gestational diabetes 07/02/2019   Fall 05/21/2019   Chronic hypertension complicating or reason for care during pregnancy, second trimester 03/09/2019   Alpha+ thalassemia ([place]) 02/23/2019   Hx of preeclampsia, prior pregnancy, currently pregnant 02/09/2019   History of gestational diabetes 02/09/2019   Supervision of high risk pregnancy, antepartum 02/09/2019   Asthma 02/09/2019   Mild tetrahydrocannabinol (THC) abuse 11/19/2016   Alcohol-induced mood disorder ([place]) 01/08/2016   Substance abuse ([place])    Oppositional defiant disorder 05/22/2012     Subjective:  Marie Cabrera is a 27 y.o. @GENDER$ @ with   ROS  Past Medical History:  Diagnosis Date   ADHD (attention deficit hyperactivity disorder)    Amenorrhea    last menses 2007   Asthma    last ospitalized >4 years ago.  NO recent problesm   Heart murmur    [facility].  MD 2 d Echo [id number]   Rectal bleeding     Outpatient Medications Prior to Visit  Medication Sig Dispense Refill   amLODipine (NORVASC) 5 MG tablet Take 1 tablet (5 mg total) by mouth daily. 90 tablet 0   amoxicillin-clavulanate (AUGMENTIN) 875-125 MG tablet Take 1 tablet by mouth 2 (two) times daily. 14 tablet 0   doxycycline (VIBRAMYCIN) 100 MG capsule Take 1 capsule (100 mg total) by mouth 2 (two) times daily. 28 capsule 0   metroNIDAZOLE (FLAGYL) 500 MG tablet Take 1 tablet (500 mg total) by mouth 2 (two) times daily. 28 tablet 0   Facility-Administered Medications Prior to Visit  Medication Dose Route Frequency Provider Last Rate Last Admin   cefTRIAXone (ROCEPHIN) injection 500 mg  500 mg Intramuscular Once Shelly Bombard, MD         No Known Allergies  Social History   Tobacco Use    Smoking status: Never   Smokeless tobacco: Never  Vaping Use   Vaping Use: Never used  Substance Use Topics   Alcohol use: Yes    Comment: occ   Drug use: Yes    Types: Marijuana    Comment: daily    Family History  Adopted: Yes  Problem Relation Age of Onset   Alcohol abuse Mother    Depression Mother    Drug abuse Mother    Mental illness Mother    Alcohol abuse Father    Drug abuse Father    Mental illness Sister    Alcohol abuse Maternal Grandmother    Depression Maternal Grandmother    Diabetes Maternal Grandmother    Kidney disease Maternal Grandmother    Colon cancer Maternal Grandfather    Crohn's disease Maternal Aunt    Colon polyps Maternal Aunt    Other Other        Pt is adopted, only knows about maternal family    Objective:  There were no vitals filed for this visit. There is no height or weight on file to calculate BMI.  Physical Exam  Lab Results: Lab Results  Component Value Date   WBC 7.5 03/10/2021   HGB 12.7 03/10/2021   HCT 39.9 03/10/2021   MCV 87.9 03/10/2021   PLT 277 03/10/2021    Lab Results  Component Value Date   CREATININE 0.71 03/10/2021  BUN 11 03/10/2021   NA 140 03/10/2021   K 3.5 03/10/2021   CL 108 03/10/2021   CO2 29 03/10/2021    Lab Results  Component Value Date   ALT 17 03/10/2021   AST 18 03/10/2021   ALKPHOS 78 03/10/2021   BILITOT 0.3 03/10/2021     Assessment & Plan:  RPR titer 1:1 contact HD for prior tx-adbiced to go to [facility] for syohllil tx Discuss PRRP G/C bv and trick+ on cytology  Laurice Record, MD [facility]   09/19/22  6:00 AM

## 2022-09-19 NOTE — Telephone Encounter (Addendum)
Spoke to [place] at [facility] to see if patient has been treated for syphilis in the past. Most recent RPR titer 1:1. RPR titer on 06/21/2021 was 1:4 at [facility]. Patient was supposed to be treated with Bicillin 2.4 million units x 3. Barbara couldn't confirm if patient received all 3 treatments.   I left voicemail with [facility]  nursing staff asking for someone to return my call to confirm if patient was treated.    Also contacted patient regarding missed appointment today and to see if patient received all 3 Bicillin injections in 06/2021, if so per Dr.Singh - patient doesn't need to be treated again.   No VM set up to leave a message. Will send my chart message as well.    [place], CMA

## 2023-02-27 ENCOUNTER — Other Ambulatory Visit (HOSPITAL_COMMUNITY)
Admission: RE | Admit: 2023-02-27 | Discharge: 2023-02-27 | Disposition: A | Discharge status: Home / Self Care | Payer: Medicaid Other | Source: Ambulatory Visit | Attending: Obstetrics and Gynecology | Admitting: Obstetrics and Gynecology

## 2023-02-27 ENCOUNTER — Ambulatory Visit (INDEPENDENT_AMBULATORY_CARE_PROVIDER_SITE_OTHER): Payer: Medicaid Other | Admitting: *Deleted

## 2023-02-27 VITALS — BP 116/77 | HR 76

## 2023-02-27 DIAGNOSIS — Z113 Encounter for screening for infections with a predominantly sexual mode of transmission: Secondary | ICD-10-CM

## 2023-02-27 NOTE — Progress Notes (Signed)
SUBJECTIVE:  27 y.o. female who desires a STI screen. Denies abnormal vaginal discharge, bleeding or significant pelvic pain. No UTI symptoms. Denies history of known exposure to STD.  No LMP recorded.  OBJECTIVE:  She appears well.   ASSESSMENT:  STI Screen   PLAN:  Pt offered STI blood screening-requested GC, chlamydia, and trichomonas probe sent to lab.  Treatment: To be determined once lab results are received.  Pt follow up as needed. 

## 2023-02-28 ENCOUNTER — Telehealth: Payer: Self-pay

## 2023-02-28 LAB — HIV ANTIBODY (ROUTINE TESTING W REFLEX): HIV Screen 4th Generation wRfx: NONREACTIVE

## 2023-02-28 LAB — HEPATITIS C ANTIBODY: Hep C Virus Ab: NONREACTIVE

## 2023-02-28 LAB — HEPATITIS B SURFACE ANTIGEN: Hepatitis B Surface Ag: NEGATIVE

## 2023-02-28 LAB — RPR: RPR Ser Ql: NONREACTIVE

## 2023-02-28 MED ORDER — FLUCONAZOLE 150 MG PO TABS: 150.0000 mg | ORAL_TABLET | Freq: Once | ORAL | 0 refills | Status: AC

## 2023-02-28 NOTE — Telephone Encounter (Signed)
Attempted to contact pt and advise of results, rx sent for yeast. Contacted lab and added STD on vaginal swab per provider request

## 2023-03-04 ENCOUNTER — Other Ambulatory Visit: Payer: Self-pay

## 2023-03-04 DIAGNOSIS — B379 Candidiasis, unspecified: Secondary | ICD-10-CM

## 2023-03-04 LAB — CERVICOVAGINAL ANCILLARY ONLY
Bacterial Vaginitis (gardnerella): NEGATIVE
Candida Glabrata: NEGATIVE
Candida Glabrata: NEGATIVE
Candida Vaginitis: POSITIVE — AB
Candida Vaginitis: POSITIVE — AB
Chlamydia: NEGATIVE
Comment: NEGATIVE
Comment: NEGATIVE
Comment: NEGATIVE
Comment: NEGATIVE
Comment: NEGATIVE
Comment: NEGATIVE
Comment: NEGATIVE
Comment: NORMAL
Neisseria Gonorrhea: NEGATIVE
Trichomonas: NEGATIVE

## 2023-03-04 MED ORDER — FLUCONAZOLE 150 MG PO TABS
150.0000 mg | ORAL_TABLET | Freq: Once | ORAL | 0 refills | Status: AC
Start: 2023-03-04 — End: 2023-03-04

## 2023-03-08 ENCOUNTER — Encounter: Payer: Self-pay | Admitting: Obstetrics

## 2023-04-11 ENCOUNTER — Ambulatory Visit: Payer: Medicaid Other

## 2023-05-03 ENCOUNTER — Ambulatory Visit: Payer: Medicaid Other

## 2023-06-12 ENCOUNTER — Ambulatory Visit: Payer: Medicaid Other

## 2023-06-26 ENCOUNTER — Ambulatory Visit: Payer: Medicaid Other

## 2023-08-25 ENCOUNTER — Emergency Department (HOSPITAL_COMMUNITY)
Admission: EM | Admit: 2023-08-25 | Discharge: 2023-08-25 | Disposition: A | Discharge status: Home / Self Care | Payer: Medicaid Other | Attending: Emergency Medicine | Admitting: Emergency Medicine

## 2023-08-25 ENCOUNTER — Other Ambulatory Visit: Payer: Self-pay

## 2023-08-25 ENCOUNTER — Emergency Department (HOSPITAL_COMMUNITY): Payer: Medicaid Other

## 2023-08-25 ENCOUNTER — Encounter (HOSPITAL_COMMUNITY): Payer: Self-pay

## 2023-08-25 DIAGNOSIS — N730 Acute parametritis and pelvic cellulitis: Secondary | ICD-10-CM | POA: Insufficient documentation

## 2023-08-25 DIAGNOSIS — B3731 Acute candidiasis of vulva and vagina: Secondary | ICD-10-CM | POA: Insufficient documentation

## 2023-08-25 DIAGNOSIS — R102 Pelvic and perineal pain: Secondary | ICD-10-CM | POA: Diagnosis present

## 2023-08-25 DIAGNOSIS — N760 Acute vaginitis: Secondary | ICD-10-CM

## 2023-08-25 LAB — CBC WITH DIFFERENTIAL/PLATELET
Abs Immature Granulocytes: 0 10*3/uL (ref 0.00–0.07)
Basophils Absolute: 0 10*3/uL (ref 0.0–0.1)
Basophils Relative: 1 %
Eosinophils Absolute: 0 10*3/uL (ref 0.0–0.5)
Eosinophils Relative: 1 %
HCT: 37.3 % (ref 36.0–46.0)
Hemoglobin: 12.4 g/dL (ref 12.0–15.0)
Lymphocytes Relative: 32 %
Lymphs Abs: 1.5 10*3/uL (ref 0.7–4.0)
MCH: 28.4 pg (ref 26.0–34.0)
MCHC: 33.2 g/dL (ref 30.0–36.0)
MCV: 85.6 fL (ref 80.0–100.0)
Monocytes Absolute: 0.2 10*3/uL (ref 0.1–1.0)
Monocytes Relative: 4 %
Neutro Abs: 2.9 10*3/uL (ref 1.7–7.7)
Neutrophils Relative %: 62 %
Platelets: 357 10*3/uL (ref 150–400)
RBC: 4.36 MIL/uL (ref 3.87–5.11)
RDW: 14.7 % (ref 11.5–15.5)
WBC: 4.7 10*3/uL (ref 4.0–10.5)
nRBC: 0 % (ref 0.0–0.2)
nRBC: 0 /100{WBCs}

## 2023-08-25 LAB — COMPREHENSIVE METABOLIC PANEL
ALT: 40 U/L (ref 0–44)
AST: 36 U/L (ref 15–41)
Albumin: 3 g/dL — ABNORMAL LOW (ref 3.5–5.0)
Alkaline Phosphatase: 76 U/L (ref 38–126)
Anion gap: 9 (ref 5–15)
BUN: 7 mg/dL (ref 6–20)
CO2: 24 mmol/L (ref 22–32)
Calcium: 8.7 mg/dL — ABNORMAL LOW (ref 8.9–10.3)
Chloride: 105 mmol/L (ref 98–111)
Creatinine, Ser: 0.66 mg/dL (ref 0.44–1.00)
GFR, Estimated: >60 mL/min (ref >60)
Glucose, Bld: 92 mg/dL (ref 70–99)
Potassium: 3.9 mmol/L (ref 3.5–5.1)
Sodium: 138 mmol/L (ref 135–145)
Total Bilirubin: 0.7 mg/dL (ref <1.2)
Total Protein: 6.3 g/dL — ABNORMAL LOW (ref 6.5–8.1)

## 2023-08-25 LAB — URINALYSIS, W/ REFLEX TO CULTURE (INFECTION SUSPECTED)
Bacteria, UA: NONE SEEN
Bilirubin Urine: NEGATIVE
Glucose, UA: NEGATIVE mg/dL
Hgb urine dipstick: NEGATIVE
Ketones, ur: NEGATIVE mg/dL
Leukocytes,Ua: NEGATIVE
Nitrite: NEGATIVE
Protein, ur: NEGATIVE mg/dL
Specific Gravity, Urine: 1.04 — ABNORMAL HIGH (ref 1.005–1.030)
pH: 8 (ref 5.0–8.0)

## 2023-08-25 LAB — MONONUCLEOSIS SCREEN: Mono Screen: NEGATIVE

## 2023-08-25 LAB — WET PREP, GENITAL
Sperm: NONE SEEN
Trich, Wet Prep: NONE SEEN
WBC, Wet Prep HPF POC: >=10 — AB (ref <10)

## 2023-08-25 LAB — HCG, SERUM, QUALITATIVE: Preg, Serum: NEGATIVE

## 2023-08-25 LAB — HIV ANTIBODY (ROUTINE TESTING W REFLEX): HIV Screen 4th Generation wRfx: NONREACTIVE

## 2023-08-25 MED ORDER — FLUCONAZOLE 150 MG PO TABS
150.0000 mg | ORAL_TABLET | Freq: Once | ORAL | Status: AC
  Administered 2023-08-25: 150 mg via ORAL
  Filled 2023-08-25: qty 1

## 2023-08-25 MED ORDER — DOXYCYCLINE HYCLATE 100 MG PO CAPS: 100.0000 mg | ORAL_CAPSULE | Freq: Two times a day (BID) | ORAL | 0 refills | Status: AC

## 2023-08-25 MED ORDER — IOHEXOL 350 MG/ML SOLN
75.0000 mL | Freq: Once | INTRAVENOUS | Status: AC | PRN
  Administered 2023-08-25: 75 mL via INTRAVENOUS

## 2023-08-25 MED ORDER — CEFTRIAXONE SODIUM 1 G IJ SOLR
1.0000 g | Freq: Once | INTRAMUSCULAR | Status: AC
  Administered 2023-08-25: 1 g via INTRAVENOUS
  Filled 2023-08-25: qty 10

## 2023-08-25 MED ORDER — DOXYCYCLINE HYCLATE 100 MG IV SOLR
100.0000 mg | Freq: Once | INTRAVENOUS | Status: AC
  Administered 2023-08-25: 100 mg via INTRAVENOUS
  Filled 2023-08-25: qty 100

## 2023-08-25 MED ORDER — METRONIDAZOLE 500 MG/100ML IV SOLN
500.0000 mg | Freq: Two times a day (BID) | INTRAVENOUS | Status: DC
  Administered 2023-08-25: 500 mg via INTRAVENOUS
  Filled 2023-08-25: qty 100

## 2023-08-25 MED ORDER — FENTANYL CITRATE PF 50 MCG/ML IJ SOSY
50.0000 ug | PREFILLED_SYRINGE | Freq: Once | INTRAMUSCULAR | Status: AC
  Administered 2023-08-25: 50 ug via INTRAVENOUS
  Filled 2023-08-25: qty 1

## 2023-08-25 MED ORDER — FLUCONAZOLE 150 MG PO TABS
150.0000 mg | ORAL_TABLET | Freq: Every day | ORAL | 0 refills | Status: AC
Start: 2023-08-28 — End: 2023-08-29

## 2023-08-25 MED ORDER — METRONIDAZOLE 500 MG PO TABS: 500.0000 mg | ORAL_TABLET | Freq: Two times a day (BID) | ORAL | 0 refills | Status: AC

## 2023-08-25 MED ORDER — LACTATED RINGERS IV BOLUS
1000.0000 mL | Freq: Once | INTRAVENOUS | Status: AC
  Administered 2023-08-25: 1000 mL via INTRAVENOUS

## 2023-08-25 NOTE — ED Notes (Signed)
Provider made aware of pelvic cart being set up and in the room.

## 2023-08-25 NOTE — ED Provider Notes (Signed)
[place] EMERGENCY DEPARTMENT AT [facility] Provider Note   CSN: [id number] Arrival date & time: 08/25/23  1124     History  Chief Complaint  Patient presents with   Pelvic Pain    Marie Cabrera is a 27 y.o. female.   Pelvic Pain     27 year old female with medical history significant for ADHD, presenting to the emergency department with pelvic pain.  The patient states "my ovaries feel swollen."  She states that on both sides of her pelvis she has 2 large painful bumps which she thinks are her ovaries.  She has had some increased vaginal discharge without significant odor.  She is less sexually active was less active 1 week ago and request testing for sexually transmitted infection.  She also states that her menstrual cycle has been abnormal and her last menstrual period was last month where she had 2 separate cycles.  She denies any fevers or chills, endorses bilateral pelvic pain that is moderate in severity.  Home Medications Prior to Admission medications   Medication Sig Start Date End Date Taking? Authorizing Provider  doxycycline (VIBRAMYCIN) 100 MG capsule Take 1 capsule (100 mg total) by mouth 2 (two) times daily for 14 days. 08/25/23 09/08/23 Yes Ernie Avena, MD  fluconazole (DIFLUCAN) 150 MG tablet Take 1 tablet (150 mg total) by mouth daily for 1 dose. 08/28/23 08/29/23 Yes Ernie Avena, MD  metroNIDAZOLE (FLAGYL) 500 MG tablet Take 1 tablet (500 mg total) by mouth 2 (two) times daily for 14 days. 08/25/23 09/08/23 Yes Ernie Avena, MD  amLODipine (NORVASC) 5 MG tablet Take 1 tablet (5 mg total) by mouth daily. 08/13/19   FairHoyle Sauer, MD  amoxicillin-clavulanate (AUGMENTIN) 875-125 MG tablet Take 1 tablet by mouth 2 (two) times daily. 03/15/21   Brock Bad, MD      Allergies    Patient has no known allergies.    Review of Systems   Review of Systems  Genitourinary:  Positive for pelvic pain and vaginal discharge. Negative for  vaginal bleeding.    Physical Exam Updated Vital Signs BP (!) 152/96   Pulse 98   Temp 97.9 F (36.6 C) (Oral)   Resp 18   Ht 5' (1.524 m)   Wt 54.4 kg   LMP 07/15/2023 (Approximate)   SpO2 100%   BMI 23.44 kg/m  Physical Exam Vitals and nursing note reviewed. Exam conducted with a chaperone present.  Constitutional:      General: She is not in acute distress.    Appearance: She is well-developed.  HENT:     Head: Normocephalic and atraumatic.  Eyes:     Conjunctiva/sclera: Conjunctivae normal.  Cardiovascular:     Rate and Rhythm: Normal rate and regular rhythm.  Pulmonary:     Effort: Pulmonary effort is normal. No respiratory distress.     Breath sounds: Normal breath sounds.  Abdominal:     Palpations: Abdomen is soft.     Tenderness: There is no abdominal tenderness.  Genitourinary:    Vagina: No bleeding or lesions.     Cervix: Discharge present. No cervical motion tenderness or cervical bleeding.     Adnexa:        Right: Tenderness and fullness present.        Left: Tenderness and fullness present.      Comments: Bilateral tender adenopathy Musculoskeletal:        General: No swelling.     Cervical back: Neck supple.  Lymphadenopathy:  Lower Body: Right inguinal adenopathy present. Left inguinal adenopathy present.  Skin:    General: Skin is warm and dry.     Capillary Refill: Capillary refill takes less than 2 seconds.  Neurological:     Mental Status: She is alert.  Psychiatric:        Mood and Affect: Mood normal.     ED Results / Procedures / Treatments   Labs (all labs ordered are listed, but only abnormal results are displayed) Labs Reviewed  WET PREP, GENITAL - Abnormal; Notable for the following components:      Result Value   Yeast Wet Prep HPF POC PRESENT (*)    Clue Cells Wet Prep HPF POC PRESENT (*)    WBC, Wet Prep HPF POC >=10 (*)    All other components within normal limits  COMPREHENSIVE METABOLIC PANEL - Abnormal; Notable  for the following components:   Calcium 8.7 (*)    Total Protein 6.3 (*)    Albumin 3.0 (*)    All other components within normal limits  URINALYSIS, W/ REFLEX TO CULTURE (INFECTION SUSPECTED) - Abnormal; Notable for the following components:   Color, Urine STRAW (*)    Specific Gravity, Urine 1.040 (*)    All other components within normal limits  CULTURE, BLOOD (ROUTINE X 2)  CULTURE, BLOOD (ROUTINE X 2)  HCG, SERUM, QUALITATIVE  CBC WITH DIFFERENTIAL/PLATELET  HIV ANTIBODY (ROUTINE TESTING W REFLEX)  MONONUCLEOSIS SCREEN  RPR  HSV 1 ANTIBODY, IGG  HSV 2 ANTIBODY, IGG  GC/CHLAMYDIA PROBE AMP ([place]) NOT AT [facility]    EKG None  Radiology CT ABDOMEN PELVIS W CONTRAST Result Date: 08/25/2023 CLINICAL DATA:  Lower abdominal and pelvic pain for 1 week. Abnormal uterine bleeding. EXAM: CT ABDOMEN AND PELVIS WITH CONTRAST TECHNIQUE: Multidetector CT imaging of the abdomen and pelvis was performed using the standard protocol following bolus administration of intravenous contrast. RADIATION DOSE REDUCTION: This exam was performed according to the departmental dose-optimization program which includes automated exposure control, adjustment of the mA and/or kV according to patient size and/or use of iterative reconstruction technique. CONTRAST:  75mL OMNIPAQUE IOHEXOL 350 MG/ML SOLN COMPARISON:  07/13/2016 FINDINGS: Lower Chest: No acute findings. Hepatobiliary: No suspicious hepatic masses identified. Gallbladder is unremarkable. No evidence of biliary ductal dilatation. Pancreas:  No mass or inflammatory changes. Spleen: Within normal limits in size and appearance. Adrenals/Urinary Tract: No suspicious masses identified. No evidence of ureteral calculi or hydronephrosis. Diffuse bladder wall thickening is seen, suspicious for cystitis. Stomach/Bowel: No evidence of obstruction, inflammatory process or abnormal fluid collections. Normal appendix visualized. A tiny midline epigastric ventral  hernia is seen which contains only fat. Vascular/Lymphatic: No pathologically enlarged lymph nodes. No acute vascular findings. Reproductive:  No mass or other significant abnormality. Other:  None. Musculoskeletal:  No suspicious bone lesions identified. IMPRESSION: Diffuse bladder wall thickening, suspicious for cystitis. No hydronephrosis. Tiny epigastric ventral hernia which contains only fat. Electronically Signed   By: Danae Orleans M.D.   On: 08/25/2023 16:17   US PELVIC TRANSABD W/PELVIC DOPPLER Result Date: 08/25/2023 CLINICAL DATA:  164010 PID (acute pelvic inflammatory disease) [id number] Pelvic pain [id number] EXAM: TRANSABDOMINAL ULTRASOUND OF PELVIS DOPPLER ULTRASOUND OF OVARIES TECHNIQUE: Transabdominal ultrasound examination of the pelvis was performed including evaluation of the uterus, ovaries, adnexal regions, and pelvic cul-de-sac. Color and duplex Doppler ultrasound was utilized to evaluate blood flow to the ovaries. COMPARISON:  CT 07/13/2016 FINDINGS: Uterus Measurements: 10.1 x 3.7 x 4.2 cm = volume: 83  mL. No fibroids or other mass visualized. Endometrium Thickness: 6 mm.  No focal abnormality visualized. Right ovary Measurements: 3.3 x 1.8 x 2.6 cm = volume: 8 mL. Normal appearance/no adnexal mass. Left ovary Measurements: 3.6 x 2.0 x 3.1 cm = volume: 12 mL. Normal appearance/no adnexal mass. Pulsed Doppler evaluation demonstrates normal low-resistance arterial and venous waveforms in both ovaries. Other: No free fluid. IMPRESSION: Unremarkable pelvic ultrasound. Electronically Signed   By: Duanne Guess D.O.   On: 08/25/2023 14:01    Procedures Procedures    Medications Ordered in ED Medications  metroNIDAZOLE (FLAGYL) IVPB 500 mg (0 mg Intravenous Stopped 08/25/23 1642)  cefTRIAXone (ROCEPHIN) 1 g in sodium chloride 0.9 % 100 mL IVPB (0 g Intravenous Stopped 08/25/23 1424)  doxycycline (VIBRAMYCIN) 100 mg in dextrose 5 % 250 mL IVPB (0 mg Intravenous Stopped 08/25/23 1644)   fentaNYL (SUBLIMAZE) injection 50 mcg (50 mcg Intravenous Given 08/25/23 1349)  lactated ringers bolus 1,000 mL (1,000 mLs Intravenous New Bag/Given 08/25/23 1349)  fluconazole (DIFLUCAN) tablet 150 mg (150 mg Oral Given 08/25/23 1647)  iohexol (OMNIPAQUE) 350 MG/ML injection 75 mL (75 mLs Intravenous Contrast Given 08/25/23 1557)    ED Course/ Medical Decision Making/ A&P Clinical Course as of 08/25/23 1751  Sun Aug 25, 2023  1347 Yeast Wet Prep HPF POC(!): PRESENT [JL]  1347 Clue Cells Wet Prep HPF POC(!): PRESENT [JL]  1347 WBC, Wet Prep HPF POC(!): >=10 [JL]    Clinical Course User Index [JL] Ernie Avena, MD                                 Medical Decision Making Amount and/or Complexity of Data Reviewed Labs: ordered. Decision-making details documented in ED Course. Radiology: ordered.  Risk Prescription drug management.     27 year old female with medical history significant for ADHD, presenting to the emergency department with pelvic pain.  The patient states "my ovaries feel swollen."  She states that on both sides of her pelvis she has 2 large painful bumps which she thinks are her ovaries.  She has had some increased vaginal discharge without significant odor.  She is less sexually active was less active 1 week ago and request testing for sexually transmitted infection.  She also states that her menstrual cycle has been abnormal and her last menstrual period was last month where she had 2 separate cycles.  She denies any fevers or chills, endorses bilateral pelvic pain that is moderate in severity.  On arrival, the patient was afebrile, nontachycardic, BP 138/98, respiratory rate 18.  Physical exam revealed bilateral pelvic tenderness, pelvic exam performed with no cervical motion tenderness, bilateral adnexal tenderness present.  Patient also on exam appear to have 2 tender lymph nodes bilaterally.  Differential diagnosis includes pelvic inflammatory disease,  tubo-ovarian abscess, ovarian torsion, ovarian cyst, pelvic congestion syndrome, lymphogranuloma venereum, cancer, mononucleosis less likely, HIV infection less likely, syphilis infection less likely.  Patient consents to STI testing and we will go ahead and empirically treat for PID. Fentanyl provided for pain control.   Labs: Wet prep positive for yeast, clue cells, greater than 10 WBCs, negative for trichomonas.  CBC without a leukocytosis or anemia, CMP generally unremarkable, mononucleosis screen negative, HIV testing nonreactive, hCG negative.  Pelvic US: IMPRESSION:  Unremarkable pelvic ultrasound.   CT Abdomen Pelvis: IMPRESSION:  Diffuse bladder wall thickening, suspicious for cystitis. No  hydronephrosis.    Tiny epigastric ventral hernia  which contains only fat.    UA: Negative for UTI  The patient was symptomatically improved, tolerating oral intake, will treat for PID with a course of doxycycline and Flagyl, return precautions provided outpatient.  Advised patient to follow-up on the results of her remaining tests on the patient portal.  Will prescribe a single dose of Diflucan to take in 3 days time.  Patient provided with return precautions, stable for discharge.   Final Clinical Impression(s) / ED Diagnoses Final diagnoses:  PID (acute pelvic inflammatory disease)  Bacterial vaginosis  Vaginal yeast infection    Rx / DC Orders ED Discharge Orders          Ordered    fluconazole (DIFLUCAN) 150 MG tablet  Daily        08/25/23 1750    doxycycline (VIBRAMYCIN) 100 MG capsule  2 times daily        08/25/23 1639    metroNIDAZOLE (FLAGYL) 500 MG tablet  2 times daily        08/25/23 1639              Ernie Avena, MD 08/25/23 1751

## 2023-08-25 NOTE — ED Notes (Signed)
Pelvic cart set up and at bedside. 

## 2023-08-25 NOTE — ED Triage Notes (Signed)
Pt c.o pelvic pain x 1 week. Pt states "my ovaries are swollen and feel like my vagina is going to fall out". Pt denies current vaginal bleeding but states she bleed twice last month. Pt also c.o right sided neck swelling x1 week as well.

## 2023-08-25 NOTE — ED Notes (Addendum)
Pt noted that she has pain when she walks. Pt is sexually active.  Pt noted she had two cycles in the last month which is abnormal for her.  She noted more than normal white discharge without odor. Pt denied n/v/d, SHOB, fevers, dizziness, or weakness. Pt had swelling and  tenderness in lower abdomen.

## 2023-08-25 NOTE — Discharge Instructions (Addendum)
Your symptoms were consistent with pelvic inflammatory disease, your Gery gonorrhea and Chlamydia testing is pending as is your syphilis testing.  Follow-up on those results on the patient portal.  Will prescribe a 14-day course of doxycycline and Flagyl to treat your symptoms.  Your wet prep did show evidence of yeast and evidence of bacterial vaginosis.  Take NSAIDs for pain control.  You are given a single dose of Diflucan as yeast was seen on your wet prep, take another single dose in 3 days time as you will also be on antibiotics.  Return for any worsening symptoms.

## 2023-08-25 NOTE — ED Notes (Signed)
Patient transported to Ultrasound 

## 2023-08-25 NOTE — ED Notes (Addendum)
Pt refused to have a last set of vitals.

## 2023-08-26 LAB — GC/CHLAMYDIA PROBE AMP (~~LOC~~) NOT AT ARMC
Chlamydia: NEGATIVE
Comment: NEGATIVE
Comment: NORMAL
Neisseria Gonorrhea: NEGATIVE

## 2023-08-26 LAB — RPR: RPR Ser Ql: NONREACTIVE

## 2023-08-28 LAB — HSV 1 ANTIBODY, IGG: HSV 1 Glycoprotein G Ab, IgG: NONREACTIVE

## 2023-08-28 LAB — HSV 2 ANTIBODY, IGG: HSV 2 Glycoprotein G Ab, IgG: NONREACTIVE

## 2023-08-30 LAB — CULTURE, BLOOD (ROUTINE X 2)
Culture: NO GROWTH
Culture: NO GROWTH
Special Requests: ADEQUATE
Special Requests: ADEQUATE

## 2023-11-12 ENCOUNTER — Ambulatory Visit: Payer: Medicaid Other | Admitting: Obstetrics

## 2024-01-26 ENCOUNTER — Emergency Department (HOSPITAL_COMMUNITY)

## 2024-01-26 ENCOUNTER — Encounter (HOSPITAL_COMMUNITY): Payer: Self-pay

## 2024-01-26 ENCOUNTER — Other Ambulatory Visit: Payer: Self-pay

## 2024-01-26 ENCOUNTER — Emergency Department (HOSPITAL_COMMUNITY)
Admission: EM | Admit: 2024-01-26 | Discharge: 2024-01-26 | Disposition: A | Discharge status: Home / Self Care | Attending: Emergency Medicine | Admitting: Emergency Medicine

## 2024-01-26 DIAGNOSIS — J45909 Unspecified asthma, uncomplicated: Secondary | ICD-10-CM | POA: Diagnosis not present

## 2024-01-26 DIAGNOSIS — N3000 Acute cystitis without hematuria: Secondary | ICD-10-CM | POA: Insufficient documentation

## 2024-01-26 DIAGNOSIS — R1033 Periumbilical pain: Secondary | ICD-10-CM | POA: Diagnosis present

## 2024-01-26 LAB — URINALYSIS, ROUTINE W REFLEX MICROSCOPIC
Bilirubin Urine: NEGATIVE
Glucose, UA: NEGATIVE mg/dL
Ketones, ur: NEGATIVE mg/dL
Nitrite: NEGATIVE
Protein, ur: 30 mg/dL — AB
Specific Gravity, Urine: 1.027 (ref 1.005–1.030)
pH: 5 (ref 5.0–8.0)

## 2024-01-26 LAB — COMPREHENSIVE METABOLIC PANEL WITH GFR
ALT: 14 U/L (ref 0–44)
AST: 21 U/L (ref 15–41)
Albumin: 4 g/dL (ref 3.5–5.0)
Alkaline Phosphatase: 77 U/L (ref 38–126)
Anion gap: 11 (ref 5–15)
BUN: 11 mg/dL (ref 6–20)
CO2: 27 mmol/L (ref 22–32)
Calcium: 9.2 mg/dL (ref 8.9–10.3)
Chloride: 101 mmol/L (ref 98–111)
Creatinine, Ser: 0.97 mg/dL (ref 0.44–1.00)
GFR, Estimated: >60 mL/min (ref >60)
Glucose, Bld: 81 mg/dL (ref 70–99)
Potassium: 3.6 mmol/L (ref 3.5–5.1)
Sodium: 139 mmol/L (ref 135–145)
Total Bilirubin: 1 mg/dL (ref 0.0–1.2)
Total Protein: 7.3 g/dL (ref 6.5–8.1)

## 2024-01-26 LAB — CBC WITH DIFFERENTIAL/PLATELET
Abs Immature Granulocytes: 0.02 10*3/uL (ref 0.00–0.07)
Basophils Absolute: 0 10*3/uL (ref 0.0–0.1)
Basophils Relative: 1 %
Eosinophils Absolute: 0.3 10*3/uL (ref 0.0–0.5)
Eosinophils Relative: 4 %
HCT: 41.7 % (ref 36.0–46.0)
Hemoglobin: 13.8 g/dL (ref 12.0–15.0)
Immature Granulocytes: 0 %
Lymphocytes Relative: 36 %
Lymphs Abs: 2.2 10*3/uL (ref 0.7–4.0)
MCH: 28.4 pg (ref 26.0–34.0)
MCHC: 33.1 g/dL (ref 30.0–36.0)
MCV: 85.8 fL (ref 80.0–100.0)
Monocytes Absolute: 0.5 10*3/uL (ref 0.1–1.0)
Monocytes Relative: 8 %
Neutro Abs: 3.1 10*3/uL (ref 1.7–7.7)
Neutrophils Relative %: 51 %
Platelets: 251 10*3/uL (ref 150–400)
RBC: 4.86 MIL/uL (ref 3.87–5.11)
RDW: 14.2 % (ref 11.5–15.5)
WBC: 6.1 10*3/uL (ref 4.0–10.5)
nRBC: 0 % (ref 0.0–0.2)

## 2024-01-26 LAB — WET PREP, GENITAL
Clue Cells Wet Prep HPF POC: NONE SEEN
Sperm: NONE SEEN
Trich, Wet Prep: NONE SEEN
WBC, Wet Prep HPF POC: <10 (ref <10)
Yeast Wet Prep HPF POC: NONE SEEN

## 2024-01-26 LAB — LIPASE, BLOOD: Lipase: 51 U/L (ref 11–51)

## 2024-01-26 LAB — PREGNANCY, URINE: Preg Test, Ur: NEGATIVE

## 2024-01-26 MED ORDER — ONDANSETRON 4 MG PO TBDP
4.0000 mg | ORAL_TABLET | Freq: Three times a day (TID) | ORAL | 0 refills | Status: AC | PRN
Start: 2024-01-26 — End: ?

## 2024-01-26 MED ORDER — NITROFURANTOIN MONOHYD MACRO 100 MG PO CAPS: 100.0000 mg | ORAL_CAPSULE | Freq: Two times a day (BID) | ORAL | 0 refills | Status: AC

## 2024-01-26 MED ORDER — IOPAMIDOL (ISOVUE-370) INJECTION 76%
75.0000 mL | Freq: Once | INTRAVENOUS | Status: AC | PRN
  Administered 2024-01-26: 75 mL via INTRAVENOUS

## 2024-01-26 MED ORDER — KETOROLAC TROMETHAMINE 15 MG/ML IJ SOLN
15.0000 mg | Freq: Once | INTRAMUSCULAR | Status: AC
  Administered 2024-01-26: 15 mg via INTRAVENOUS
  Filled 2024-01-26: qty 1

## 2024-01-26 MED ORDER — FAMOTIDINE 20 MG PO TABS: 20.0000 mg | ORAL_TABLET | Freq: Two times a day (BID) | ORAL | 0 refills | Status: AC

## 2024-01-26 NOTE — ED Triage Notes (Signed)
 Pt came in via POV d/t being woke from sleep d/t abd pains that felt like cramps & then states the pain moved into her chest are. Was initially rated 8/10 on a scale & upon arrival to ED she is now 6/10.

## 2024-01-26 NOTE — ED Provider Triage Note (Signed)
 Emergency Medicine Provider Triage Evaluation Note  Marie Cabrera , a 28 y.o. female  was evaluated in triage.  Pt complains of abdominal pain.  Pt request std testing as well   Review of Systems  Positive: Nausea,  Negative: fever  Physical Exam  BP (!) 126/92   Pulse 91   Temp 98.4 F (36.9 C)   Resp 16   SpO2 100%  Gen:   Awake, no distress   Resp:  Normal effort  MSK:   Moves extremities without difficulty  Other:  Tender lower abdomen   Medical Decision Making  Medically screening exam initiated at 2:21 PM.  Appropriate orders placed.  Marie Cabrera was informed that the remainder of the evaluation will be completed by another provider, this initial triage assessment does not replace that evaluation, and the importance of remaining in the ED until their evaluation is complete.     Sandi Crosby, PA-C 01/26/24 1423

## 2024-01-26 NOTE — Discharge Instructions (Signed)
 Marie Cabrera:  Thank you for allowing us  to take care of you today.  We hope you begin feeling better soon. You were seen today for abdominal pain, nausea..  You have some evidence of UTI on laboratory studies.  Your bladder is also thick on CT scan.  Please take medication as prescribed.  To-Do:  Please follow-up with your primary doctor within the next 2-3 days. It is important that you review any labs or imaging results (if any) that you had today with them. Your preliminary imaging results (if any) are attached. Please return to the Emergency Department or call 911 if you experience chest pain, shortness of breath, severe pain, severe fever, altered mental status, or have any reason to think that you need emergency medical care.  Thank you again.  Hope you feel better soon.  Arminda Landmark, MD Department of Emergency Medicine

## 2024-01-26 NOTE — ED Provider Notes (Signed)
 [place] EMERGENCY DEPARTMENT AT [facility] Provider Note  History  Chief Complaint:  Abdominal Pain and Chest Pain   Abdominal Pain Associated symptoms: chest pain   Chest Pain Associated symptoms: abdominal pain      Serra AERIE DONICA is a 28 y.o. female with a history of ADHD who presents the emergency department for abdominal pain.  She reports that her abdominal pain started last night in the periumbilical area.  She states it did radiate down to her lower abdomen.  She states that when she laid down last night she felt like it was coming up into her chest.  She is never had this feeling before.  She states that she has been nauseous without vomiting.  She states on the car ride and that she felt that the bumps were causing her belly to hurt.  She states that if she scratches up in a ball her belly feels better.  She states that currently she has no chest pain.  No shortness of breath or fevers. She reports her last LMP was 2 days ago. No vaginal symptoms such as discharge or bleeding.  Past Medical History:  Diagnosis Date   ADHD (attention deficit hyperactivity disorder)    Amenorrhea    last menses 2007   Asthma    last ospitalized >4 years ago.  NO recent problesm   Heart murmur    [facility].  MD 2 d Echo [id number]   Rectal bleeding     Past Surgical History:  Procedure Laterality Date   CLOSED REDUCTION FINGER WITH PERCUTANEOUS PINNING Left 02/24/2019   Procedure: CLOSED REDUCTION INTERNAL FIXATION MIDDLE PHALANX LEFT INDEX FINGER WITH PERCUTANEOUS PINNING;  Surgeon: Oralia Bills, MD;  Location: [facility] OR;  Service: Orthopedics;  Laterality: Left;   COLONOSCOPY  03/14/2012   Procedure: COLONOSCOPY;  Surgeon: Fortunato Ill, MD;  Location: [facility] OR;  Service: Gastroenterology;  Laterality: N/A;   I & D EXTREMITY Left 02/24/2019   Procedure: IRRIGATION AND DEBRIDEMENT LEFT INDEX FINGER;  Surgeon: Oralia Bills, MD;  Location: [facility] OR;  Service:  Orthopedics;  Laterality: Left;   No surgical history      Family History  Adopted: Yes  Problem Relation Age of Onset   Alcohol abuse Mother    Depression Mother    Drug abuse Mother    Mental illness Mother    Alcohol abuse Father    Drug abuse Father    Mental illness Sister    Alcohol abuse Maternal Grandmother    Depression Maternal Grandmother    Diabetes Maternal Grandmother    Kidney disease Maternal Grandmother    Colon cancer Maternal Grandfather    Crohn's disease Maternal Aunt    Colon polyps Maternal Aunt    Other Other        Pt is adopted, only knows about maternal family    Social History   Tobacco Use   Smoking status: Never   Smokeless tobacco: Never  Vaping Use   Vaping status: Never Used  Substance Use Topics   Alcohol use: Yes    Comment: occ   Drug use: Yes    Types: Marijuana    Comment: daily    Review of Systems  Review of Systems  Cardiovascular:  Positive for chest pain.  Gastrointestinal:  Positive for abdominal pain.      Reviewed and documented in HPI if pertinent.   Physical Exam   ED Triage Vitals  Encounter Vitals  Group     BP 01/26/24 1412 (!) 126/92     Systolic BP Percentile --      Diastolic BP Percentile --      Pulse Rate 01/26/24 1412 91     Resp 01/26/24 1412 16     Temp 01/26/24 1412 98.4 F (36.9 C)     Temp src --      SpO2 01/26/24 1412 100 %     Weight --      Height --      Head Circumference --      Peak Flow --      Pain Score 01/26/24 1415 6     Pain Loc --      Pain Education --      Exclude from Growth Chart --      Physical Exam Vitals and nursing note reviewed.  Constitutional:      General: She is not in acute distress.    Appearance: She is well-developed.  HENT:     Head: Normocephalic and atraumatic.  Eyes:     Conjunctiva/sclera: Conjunctivae normal.  Cardiovascular:     Rate and Rhythm: Normal rate and regular rhythm.     Heart sounds: No murmur heard. Pulmonary:      Effort: Pulmonary effort is normal. No respiratory distress.     Breath sounds: Normal breath sounds.  Abdominal:     Palpations: Abdomen is soft.     Tenderness: There is generalized abdominal tenderness (very mild). There is no guarding or rebound.  Musculoskeletal:        General: No swelling.     Cervical back: Neck supple.  Skin:    General: Skin is warm and dry.     Capillary Refill: Capillary refill takes less than 2 seconds.  Neurological:     Mental Status: She is alert.  Psychiatric:        Mood and Affect: Mood normal.      Procedures   Procedures  ED Course - Medical Decision Making  Brief Overview Shirleymae DEMETRIS MEINHARDT is a 28 y.o. female who presents as per above.  I have reviewed the nursing documentation for past medical history, family history, and social history and agree.  I have reviewed the patient's vital signs. There are no abnormalities.  Initial Differential Diagnoses: I am primarily concerned for UTI, appendicitis, colitis, pancreatitis, pregnancy.  Therapies: These medications and interventions were provided for the patient while in the ED.  Medications  ketorolac  (TORADOL ) 15 MG/ML injection 15 mg (15 mg Intravenous Given 01/26/24 1735)  iopamidol  (ISOVUE -370) 76 % injection 75 mL (75 mLs Intravenous Contrast Given 01/26/24 1831)    Testing Results: On my interpretation labs are significant for : Lipase 51 No significant electrolyte abnormalities Pregnancy negative UA with bacteria  On my interpretation imaging is significant for: CT abdomen pelvis without acute abnormality; does reveal bladder wall thickening  See the EMR for full details regarding lab and imaging results.   Medical Decision Making 28 year old female who presents the emergency department for abdominal pain.  On initial evaluation patient is curled up in a ball bringing her knees to the chest.  Her abdominal exam is soft, nondistended without signs of peritonitis.  Her  lungs are clear to auscultation bilaterally.  She states that she is not having any vaginal symptoms.  However asked for STI test in triage.  I reconfirmed with urine but she is not have any vaginal bleeding, pelvic pain or discharge.  Laboratory studies  reveal normal white count.  No elevation in lipase.  No significant lecture light abnormalities.  UA has some bacteria however does have squamous cells.  Patient does have suprapubic domino pain on my exam.  Questionable dysuria.  Given her exam and findings I do feel that we should treat for UTI.  Also patient is describing reflux symptoms therefore we did send a prescription for famotidine.  Also sent a prescription for Zofran  for nausea.  Patient also is a heavy user of THC.  There could be component of hyperemesis syndrome as well.  We discussed refraining from [facility].  She states that she does not feel this is a problem because she has been doing it for a number of years.  Repeat abdominal exam reveals no tenderness, no distention.  She is laying comfortably in bed.  Therefore I feel that she can be discharged for outpatient management.  Amount and/or Complexity of Data Reviewed Radiology: ordered.  Risk Prescription drug management.     ### All radiography studies, electrocardiograms, and laboratory data were personally reviewed by me and incorporated into my medical decision making. Impression   1. Acute cystitis without hematuria      Note: Dragon medical dictation software was used in the creation of this note.     Arminda Landmark, MD 01/26/24 2217    Early Glisson, MD 01/26/24 [phone]

## 2024-01-27 LAB — GC/CHLAMYDIA PROBE AMP (~~LOC~~) NOT AT ARMC
Chlamydia: NEGATIVE
Comment: NEGATIVE
Comment: NORMAL
Neisseria Gonorrhea: NEGATIVE

## 2024-01-29 ENCOUNTER — Ambulatory Visit: Admitting: Obstetrics and Gynecology

## 2024-02-11 ENCOUNTER — Ambulatory Visit: Admitting: Obstetrics and Gynecology

## 2024-02-24 ENCOUNTER — Encounter: Payer: Self-pay | Admitting: Obstetrics

## 2024-02-24 ENCOUNTER — Ambulatory Visit: Admitting: Obstetrics

## 2024-02-24 ENCOUNTER — Other Ambulatory Visit (HOSPITAL_COMMUNITY)
Admission: RE | Admit: 2024-02-24 | Discharge: 2024-02-24 | Disposition: A | Discharge status: Home / Self Care | Source: Ambulatory Visit | Attending: Obstetrics | Admitting: Obstetrics

## 2024-02-24 VITALS — BP 123/82 | HR 63 | Ht 60.0 in | Wt 114.4 lb

## 2024-02-24 DIAGNOSIS — N898 Other specified noninflammatory disorders of vagina: Secondary | ICD-10-CM

## 2024-02-24 DIAGNOSIS — Z3202 Encounter for pregnancy test, result negative: Secondary | ICD-10-CM

## 2024-02-24 DIAGNOSIS — Z1331 Encounter for screening for depression: Secondary | ICD-10-CM | POA: Diagnosis not present

## 2024-02-24 DIAGNOSIS — Z01419 Encounter for gynecological examination (general) (routine) without abnormal findings: Secondary | ICD-10-CM | POA: Insufficient documentation

## 2024-02-24 DIAGNOSIS — Z3009 Encounter for other general counseling and advice on contraception: Secondary | ICD-10-CM

## 2024-02-24 DIAGNOSIS — Z30013 Encounter for initial prescription of injectable contraceptive: Secondary | ICD-10-CM

## 2024-02-24 DIAGNOSIS — Z113 Encounter for screening for infections with a predominantly sexual mode of transmission: Secondary | ICD-10-CM

## 2024-02-24 LAB — POCT URINE PREGNANCY: Preg Test, Ur: NEGATIVE

## 2024-02-24 MED ORDER — MEDROXYPROGESTERONE ACETATE 150 MG/ML IM SUSY: 1.0000 mL | PREFILLED_SYRINGE | INTRAMUSCULAR | 0 refills | Status: AC

## 2024-02-24 NOTE — Progress Notes (Signed)
 Subjective:        Marie Cabrera is a 28 y.o. female here for a routine exam.  Current complaints: Vaginal discharge.    Personal health questionnaire:  Is patient Ashkenazi Jewish, have a family history of breast and/or ovarian cancer: no Is there a family history of uterine cancer diagnosed at age < 87, gastrointestinal cancer, urinary tract cancer, family member who is a Personnel officer syndrome-associated carrier: yes Is the patient overweight and hypertensive, family history of diabetes, personal history of gestational diabetes, preeclampsia or PCOS: no Is patient over 45, have PCOS,  family history of premature CHD under age 54, diabetes, smoke, have hypertension or peripheral artery disease:  no At any time, has a partner hit, kicked or otherwise hurt or frightened you?: no Over the past 2 weeks, have you felt down, depressed or hopeless?: no Over the past 2 weeks, have you felt little interest or pleasure in doing things?:no   Gynecologic History Patient's last menstrual period was 02/11/2024. Contraception: none Last Pap: 2023. Results were: normal Last mammogram: n/a. Results were: n/a  Obstetric History OB History  Gravida Para Term Preterm AB Living  2 2 2  0 0 2  SAB IAB Ectopic Multiple Live Births  0 0 0 0 2    # Outcome Date GA Lbr Len/2nd Weight Sex Type Anes PTL Lv  2 Term 08/12/19 [redacted]w[redacted]d 10:50 / 00:06 6 lb 4.5 oz (2.85 kg) M Vag-Spont EPI  LIV  1 Term 06/01/17 [redacted]w[redacted]d 14:12 / 00:32 6 lb 1.9 oz (2.775 kg) M Vag-Spont EPI  LIV    Past Medical History:  Diagnosis Date   ADHD (attention deficit hyperactivity disorder)    Amenorrhea    last menses 2007   Asthma    last ospitalized >4 years ago.  NO recent problesm   Heart murmur    [facility].  MD 2 d Echo [id number]   Rectal bleeding     Past Surgical History:  Procedure Laterality Date   CLOSED REDUCTION FINGER WITH PERCUTANEOUS PINNING Left 02/24/2019   Procedure: CLOSED REDUCTION INTERNAL FIXATION  MIDDLE PHALANX LEFT INDEX FINGER WITH PERCUTANEOUS PINNING;  Surgeon: Oralia Bills, MD;  Location: [facility] OR;  Service: Orthopedics;  Laterality: Left;   COLONOSCOPY  03/14/2012   Procedure: COLONOSCOPY;  Surgeon: Fortunato Ill, MD;  Location[facility] OR;  Service: Gastroenterology;  Laterality: N/A;   I & D EXTREMITY Left 02/24/2019   Procedure: IRRIGATION AND DEBRIDEMENT LEFT INDEX FINGER;  Surgeon: Oralia Bills, MD;  Location: [facility] OR;  Service: Orthopedics;  Laterality: Left;   No surgical history       Current Outpatient Medications:    medroxyPROGESTERone  Acetate 150 MG/ML SUSY, Inject 1 mL (150 mg total) into the muscle every 3 (three) months., Disp: 1 mL, Rfl: 0   amLODipine  (NORVASC ) 5 MG tablet, Take 1 tablet (5 mg total) by mouth daily., Disp: 90 tablet, Rfl: 0   amoxicillin -clavulanate (AUGMENTIN ) 875-125 MG tablet, Take 1 tablet by mouth 2 (two) times daily., Disp: 14 tablet, Rfl: 0   famotidine  (PEPCID ) 20 MG tablet, Take 1 tablet (20 mg total) by mouth 2 (two) times daily. (Patient not taking: Reported on 02/24/2024), Disp: 14 tablet, Rfl: 0   ondansetron  (ZOFRAN -ODT) 4 MG disintegrating tablet, Take 1 tablet (4 mg total) by mouth every 8 (eight) hours as needed for nausea or vomiting. (Patient not taking: Reported on 02/24/2024), Disp: 20 tablet, Rfl: 0  Current Facility-Administered Medications:  cefTRIAXone  (ROCEPHIN ) injection 500 mg, 500 mg, Intramuscular, Once, Gabrielle Joiner, MD No Known Allergies  Social History   Tobacco Use   Smoking status: Never   Smokeless tobacco: Never  Substance Use Topics   Alcohol use: Yes    Comment: occ    Family History  Adopted: Yes  Problem Relation Age of Onset   Alcohol abuse Mother    Depression Mother    Drug abuse Mother    Mental illness Mother    Alcohol abuse Father    Drug abuse Father    Mental illness Sister    Alcohol abuse Maternal Grandmother    Depression Maternal Grandmother    Diabetes Maternal  Grandmother    Kidney disease Maternal Grandmother    Colon cancer Maternal Grandfather    Crohn's disease Maternal Aunt    Colon polyps Maternal Aunt    Other Other        Pt is adopted, only knows about maternal family      Review of Systems  Constitutional: negative for fatigue and weight loss Respiratory: negative for cough and wheezing Cardiovascular: negative for chest pain, fatigue and palpitations Gastrointestinal: negative for abdominal pain and change in bowel habits Musculoskeletal:negative for myalgias Neurological: negative for gait problems and tremors Behavioral/Psych: negative for abusive relationship, depression Endocrine: negative for temperature intolerance    Genitourinary: POSITIVE FOR VAGINAL DISCHARGE.  negative for abnormal menstrual periods, genital lesions, hot flashes, sexual problems  Integument/breast: negative for breast lump, breast tenderness, nipple discharge and skin lesion(s)    Objective:       BP 123/82   Pulse 63   Ht 5' (1.524 m)   Wt 114 lb 6.4 oz (51.9 kg)   LMP 02/11/2024   BMI 22.34 kg/m  General:   Alert AND NO DISTRESS  Skin:   no rash or abnormalities  Lungs:   clear to auscultation bilaterally  Heart:   regular rate and rhythm, S1, S2 normal, no murmur, click, rub or gallop  Breasts:   normal without suspicious masses, skin or nipple changes or axillary nodes  Abdomen:  normal findings: no organomegaly, soft, non-tender and no hernia  Pelvis:  External genitalia: normal general appearance Urinary system: urethral meatus normal and bladder without fullness, nontender Vaginal: normal without tenderness, induration or masses Cervix: normal appearance Adnexa: normal bimanual exam Uterus: anteverted and non-tender, normal size   Lab Review Urine pregnancy test Labs reviewed yes Radiologic studies reviewed no  I have spent a total of 20 minutes of face-to-face time, excluding clinical staff time, reviewing notes and  preparing to see patient, ordering tests and/or medications, and counseling the patient.   Assessment:    1. Encounter for gynecological examination with Papanicolaou smear of cervix (Primary) Rx: - Cytology - PAP  2. Vaginal discharge Rx: - Cervicovaginal ancillary only  3. Screening for STD (sexually transmitted disease) Rx: - Hepatitis B surface antigen - Hepatitis C antibody - HIV Antibody (routine testing w rflx) - RPR  4. Encounter for counseling regarding contraception - options discussed.  Wants Depo.  5. Encounter for initial prescription of injectable contraceptive Rx: - POCT urine pregnancy:  NEGATIVE - medroxyPROGESTERone  Acetate 150 MG/ML SUSY; Inject 1 mL (150 mg total) into the muscle every 3 (three) months.  Dispense: 1 mL; Refill: 0      Plan:    Education reviewed: calcium  supplements, depression evaluation, low fat, low cholesterol diet, safe sex/STD prevention, self breast exams, and weight bearing exercise. Contraception: none. Follow  up in: 1 year.   Meds ordered this encounter  Medications   medroxyPROGESTERone  Acetate 150 MG/ML SUSY    Sig: Inject 1 mL (150 mg total) into the muscle every 3 (three) months.    Dispense:  1 mL    Refill:  0   Orders Placed This Encounter  Procedures   Hepatitis B surface antigen   Hepatitis C antibody   HIV Antibody (routine testing w rflx)   RPR   POCT urine pregnancy     Gabrielle Joiner, MD, FACOG Attending Obstetrician & Gynecologist, [facility] for [facility], [facility], [place] 02/24/2024

## 2024-02-24 NOTE — Progress Notes (Unsigned)
 Would like to discuss depo. Request all STI testing.

## 2024-02-25 ENCOUNTER — Ambulatory Visit

## 2024-02-25 LAB — HEPATITIS B SURFACE ANTIGEN: Hepatitis B Surface Ag: NEGATIVE

## 2024-02-25 LAB — RPR: RPR Ser Ql: NONREACTIVE

## 2024-02-25 LAB — HIV ANTIBODY (ROUTINE TESTING W REFLEX): HIV Screen 4th Generation wRfx: NONREACTIVE

## 2024-02-25 LAB — HEPATITIS C ANTIBODY: Hep C Virus Ab: NONREACTIVE

## 2024-02-26 LAB — CERVICOVAGINAL ANCILLARY ONLY
Bacterial Vaginitis (gardnerella): POSITIVE — AB
Candida Glabrata: NEGATIVE
Candida Vaginitis: POSITIVE — AB
Chlamydia: NEGATIVE
Comment: NEGATIVE
Comment: NEGATIVE
Comment: NEGATIVE
Comment: NEGATIVE
Comment: NEGATIVE
Comment: NORMAL
Neisseria Gonorrhea: NEGATIVE
Trichomonas: NEGATIVE

## 2024-02-27 ENCOUNTER — Ambulatory Visit: Payer: Self-pay | Admitting: Obstetrics

## 2024-02-27 ENCOUNTER — Other Ambulatory Visit: Payer: Self-pay

## 2024-02-27 DIAGNOSIS — B3731 Acute candidiasis of vulva and vagina: Secondary | ICD-10-CM

## 2024-02-27 DIAGNOSIS — B9689 Other specified bacterial agents as the cause of diseases classified elsewhere: Secondary | ICD-10-CM

## 2024-02-27 MED ORDER — FLUCONAZOLE 150 MG PO TABS: 150.0000 mg | ORAL_TABLET | Freq: Once | ORAL | 0 refills | Status: AC

## 2024-02-27 MED ORDER — METRONIDAZOLE 500 MG PO TABS: 500.0000 mg | ORAL_TABLET | Freq: Two times a day (BID) | ORAL | 2 refills | Status: AC

## 2024-03-02 LAB — CYTOLOGY - PAP: Diagnosis: NEGATIVE

## 2024-04-27 ENCOUNTER — Ambulatory Visit

## 2024-04-29 ENCOUNTER — Ambulatory Visit

## 2024-06-22 ENCOUNTER — Emergency Department (HOSPITAL_COMMUNITY)
Admission: EM | Admit: 2024-06-22 | Discharge: 2024-06-23 | Discharge status: Against Medical Advice | Attending: Emergency Medicine | Admitting: Emergency Medicine

## 2024-06-22 ENCOUNTER — Encounter (HOSPITAL_COMMUNITY): Payer: Self-pay

## 2024-06-22 ENCOUNTER — Other Ambulatory Visit: Payer: Self-pay

## 2024-06-22 ENCOUNTER — Emergency Department (HOSPITAL_COMMUNITY)

## 2024-06-22 DIAGNOSIS — Z5321 Procedure and treatment not carried out due to patient leaving prior to being seen by health care provider: Secondary | ICD-10-CM | POA: Insufficient documentation

## 2024-06-22 DIAGNOSIS — R1030 Lower abdominal pain, unspecified: Secondary | ICD-10-CM | POA: Insufficient documentation

## 2024-06-22 DIAGNOSIS — N898 Other specified noninflammatory disorders of vagina: Secondary | ICD-10-CM | POA: Insufficient documentation

## 2024-06-22 DIAGNOSIS — R10A1 Flank pain, right side: Secondary | ICD-10-CM | POA: Diagnosis not present

## 2024-06-22 LAB — URINALYSIS, ROUTINE W REFLEX MICROSCOPIC
Bilirubin Urine: NEGATIVE
Glucose, UA: NEGATIVE mg/dL
Hgb urine dipstick: NEGATIVE
Ketones, ur: NEGATIVE mg/dL
Leukocytes,Ua: NEGATIVE
Nitrite: NEGATIVE
Protein, ur: NEGATIVE mg/dL
Specific Gravity, Urine: 1.025 (ref 1.005–1.030)
pH: 6 (ref 5.0–8.0)

## 2024-06-22 LAB — CBC WITH DIFFERENTIAL/PLATELET
Abs Immature Granulocytes: 0.03 K/uL (ref 0.00–0.07)
Basophils Absolute: 0.1 K/uL (ref 0.0–0.1)
Basophils Relative: 1 %
Eosinophils Absolute: 0.7 K/uL — ABNORMAL HIGH (ref 0.0–0.5)
Eosinophils Relative: 8 %
HCT: 38.6 % (ref 36.0–46.0)
Hemoglobin: 12.5 g/dL (ref 12.0–15.0)
Immature Granulocytes: 0 %
Lymphocytes Relative: 28 %
Lymphs Abs: 2.4 K/uL (ref 0.7–4.0)
MCH: 27.5 pg (ref 26.0–34.0)
MCHC: 32.4 g/dL (ref 30.0–36.0)
MCV: 84.8 fL (ref 80.0–100.0)
Monocytes Absolute: 0.7 K/uL (ref 0.1–1.0)
Monocytes Relative: 8 %
Neutro Abs: 4.9 K/uL (ref 1.7–7.7)
Neutrophils Relative %: 55 %
Platelets: 241 K/uL (ref 150–400)
RBC: 4.55 MIL/uL (ref 3.87–5.11)
RDW: 15.4 % (ref 11.5–15.5)
WBC: 8.8 K/uL (ref 4.0–10.5)
nRBC: 0 % (ref 0.0–0.2)

## 2024-06-22 LAB — COMPREHENSIVE METABOLIC PANEL WITH GFR
ALT: 12 U/L (ref 0–44)
AST: 18 U/L (ref 15–41)
Albumin: 3.8 g/dL (ref 3.5–5.0)
Alkaline Phosphatase: 79 U/L (ref 38–126)
Anion gap: 9 (ref 5–15)
BUN: 10 mg/dL (ref 6–20)
CO2: 24 mmol/L (ref 22–32)
Calcium: 8.8 mg/dL — ABNORMAL LOW (ref 8.9–10.3)
Chloride: 105 mmol/L (ref 98–111)
Creatinine, Ser: 0.69 mg/dL (ref 0.44–1.00)
GFR, Estimated: >60 mL/min (ref >60)
Glucose, Bld: 80 mg/dL (ref 70–99)
Potassium: 3.9 mmol/L (ref 3.5–5.1)
Sodium: 138 mmol/L (ref 135–145)
Total Bilirubin: 0.5 mg/dL (ref 0.0–1.2)
Total Protein: 6.9 g/dL (ref 6.5–8.1)

## 2024-06-22 LAB — HCG, SERUM, QUALITATIVE: Preg, Serum: NEGATIVE

## 2024-06-22 LAB — LIPASE, BLOOD: Lipase: 26 U/L (ref 11–51)

## 2024-06-22 MED ORDER — ONDANSETRON 4 MG PO TBDP
4.0000 mg | ORAL_TABLET | Freq: Once | ORAL | Status: AC
  Administered 2024-06-22: 4 mg via ORAL
  Filled 2024-06-22: qty 1

## 2024-06-22 MED ORDER — OXYCODONE-ACETAMINOPHEN 5-325 MG PO TABS
ORAL_TABLET | ORAL | Status: AC
  Filled 2024-06-22: qty 1

## 2024-06-22 MED ORDER — OXYCODONE-ACETAMINOPHEN 5-325 MG PO TABS
1.0000 | ORAL_TABLET | Freq: Once | ORAL | Status: AC
  Administered 2024-06-22: 1 via ORAL

## 2024-06-22 NOTE — ED Triage Notes (Signed)
 Pt reports feeling generally unwell x1.5 weeks. Pt endorses abd pain, nausea, fatigue, fever.

## 2024-06-22 NOTE — ED Provider Triage Note (Signed)
 Emergency Medicine Provider Triage Evaluation Note  Marie Cabrera , a 28 y.o. female  was evaluated in triage.  Pt complains of 1 week of lower abdominal pain, right sided flank pain and abnormal vaginal discharge.  He denies any dysuria frequency or urgency.  She is sexually active.  She had a negative home pregnancy test prior to arrival  Review of Systems  Positive: Flank pain Negative: Fever  Physical Exam  BP (!) 157/94   Pulse 80   Temp 98.7 F (37.1 C)   Resp 18   SpO2 97%  Gen:   Awake, no distress   Resp:  Normal effort  MSK:   Moves extremities without difficulty  Other:  Patient has significant bilateral CVA tenderness, mild reproducible suprapubic pain  Medical Decision Making  Medically screening exam initiated at 5:54 PM.  Appropriate orders placed.  Marie Cabrera was informed that the remainder of the evaluation will be completed by another provider, this initial triage assessment does not replace that evaluation, and the importance of remaining in the ED until their evaluation is complete.     Shermon Warren LOISE, PA-C 06/22/24 1755

## 2024-06-23 NOTE — ED Notes (Signed)
 Patient decided to leave due to long wait, and stated she has to get her 28 year old.

## 2024-07-08 ENCOUNTER — Ambulatory Visit

## 2024-07-09 ENCOUNTER — Ambulatory Visit (INDEPENDENT_AMBULATORY_CARE_PROVIDER_SITE_OTHER)

## 2024-07-09 ENCOUNTER — Other Ambulatory Visit (HOSPITAL_COMMUNITY)
Admission: RE | Admit: 2024-07-09 | Discharge: 2024-07-09 | Disposition: A | Discharge status: Home / Self Care | Source: Ambulatory Visit | Attending: Obstetrics | Admitting: Obstetrics

## 2024-07-09 VITALS — BP 139/79 | HR 87

## 2024-07-09 DIAGNOSIS — Z113 Encounter for screening for infections with a predominantly sexual mode of transmission: Secondary | ICD-10-CM

## 2024-07-09 NOTE — Progress Notes (Signed)
 SUBJECTIVE:  28 y.o. female who desires a STI screen. Denies abnormal vaginal discharge, bleeding or significant pelvic pain. No UTI symptoms. Denies history of known exposure to STD.  Patient's last menstrual period was 06/13/2024 (approximate).  OBJECTIVE:  She appears well.   ASSESSMENT:  STI Screen   PLAN:  Pt offered STI blood screening-requested GC, chlamydia, and trichomonas probe sent to lab.  Treatment: To be determined once lab results are received.  Pt follow up as needed.

## 2024-07-10 ENCOUNTER — Ambulatory Visit: Payer: Self-pay | Admitting: Obstetrics and Gynecology

## 2024-07-10 LAB — HIV ANTIBODY (ROUTINE TESTING W REFLEX): HIV Screen 4th Generation wRfx: NONREACTIVE

## 2024-07-10 LAB — CERVICOVAGINAL ANCILLARY ONLY
Bacterial Vaginitis (gardnerella): POSITIVE — AB
Candida Glabrata: NEGATIVE
Candida Vaginitis: POSITIVE — AB
Chlamydia: NEGATIVE
Comment: NEGATIVE
Comment: NEGATIVE
Comment: NEGATIVE
Comment: NEGATIVE
Comment: NEGATIVE
Comment: NORMAL
Neisseria Gonorrhea: NEGATIVE
Trichomonas: NEGATIVE

## 2024-07-10 LAB — HEPATITIS C ANTIBODY: Hep C Virus Ab: NONREACTIVE

## 2024-07-10 LAB — RPR: RPR Ser Ql: NONREACTIVE

## 2024-07-10 LAB — HEPATITIS B SURFACE ANTIGEN: Hepatitis B Surface Ag: NEGATIVE

## 2024-07-13 ENCOUNTER — Other Ambulatory Visit: Payer: Self-pay

## 2024-07-13 MED ORDER — FLUCONAZOLE 150 MG PO TABS: 150.0000 mg | ORAL_TABLET | Freq: Once | ORAL | 0 refills | Status: AC

## 2024-07-13 MED ORDER — METRONIDAZOLE 500 MG PO TABS: 500.0000 mg | ORAL_TABLET | Freq: Two times a day (BID) | ORAL | 0 refills | Status: AC

## 2024-09-30 ENCOUNTER — Ambulatory Visit: Payer: Self-pay
